# Patient Record
Sex: Male | Born: 1955 | Race: Black or African American | Hispanic: No | Smoking: Current every day smoker
Health system: Southern US, Community
[De-identification: ages and names within clinical notes are randomized; demographics above are authoritative.]

## PROBLEM LIST (undated history)

## (undated) DIAGNOSIS — J45909 Unspecified asthma, uncomplicated: Secondary | ICD-10-CM

## (undated) DIAGNOSIS — I739 Peripheral vascular disease, unspecified: Secondary | ICD-10-CM

## (undated) DIAGNOSIS — I10 Essential (primary) hypertension: Secondary | ICD-10-CM

## (undated) HISTORY — DX: Essential (primary) hypertension: I10

## (undated) HISTORY — DX: Peripheral vascular disease, unspecified: I73.9

## (undated) HISTORY — PX: CYST EXCISION: SHX5701

## (undated) HISTORY — PX: COLONOSCOPY: SHX174

---

## 2000-05-24 ENCOUNTER — Emergency Department (HOSPITAL_COMMUNITY): Admission: EM | Admit: 2000-05-24 | Discharge: 2000-05-24 | Payer: Self-pay | Admitting: Emergency Medicine

## 2000-05-24 ENCOUNTER — Encounter: Payer: Self-pay | Admitting: Emergency Medicine

## 2000-07-20 ENCOUNTER — Emergency Department (HOSPITAL_COMMUNITY): Admission: EM | Admit: 2000-07-20 | Discharge: 2000-07-20 | Payer: Self-pay | Admitting: Emergency Medicine

## 2000-07-27 ENCOUNTER — Emergency Department (HOSPITAL_COMMUNITY): Admission: EM | Admit: 2000-07-27 | Discharge: 2000-07-27 | Payer: Self-pay | Admitting: Emergency Medicine

## 2002-05-03 ENCOUNTER — Emergency Department (HOSPITAL_COMMUNITY): Admission: EM | Admit: 2002-05-03 | Discharge: 2002-05-03 | Payer: Self-pay | Admitting: Emergency Medicine

## 2003-10-21 ENCOUNTER — Emergency Department (HOSPITAL_COMMUNITY): Admission: EM | Admit: 2003-10-21 | Discharge: 2003-10-21 | Payer: Self-pay | Admitting: *Deleted

## 2009-07-21 ENCOUNTER — Emergency Department (HOSPITAL_COMMUNITY): Admission: EM | Admit: 2009-07-21 | Discharge: 2009-07-21 | Payer: Self-pay | Admitting: Emergency Medicine

## 2009-07-22 ENCOUNTER — Emergency Department (HOSPITAL_COMMUNITY): Admission: EM | Admit: 2009-07-22 | Discharge: 2009-07-22 | Payer: Self-pay | Admitting: Emergency Medicine

## 2009-07-25 ENCOUNTER — Emergency Department (HOSPITAL_COMMUNITY): Admission: EM | Admit: 2009-07-25 | Discharge: 2009-07-26 | Payer: Self-pay | Admitting: Emergency Medicine

## 2009-07-27 ENCOUNTER — Emergency Department (HOSPITAL_COMMUNITY): Admission: EM | Admit: 2009-07-27 | Discharge: 2009-07-27 | Payer: Self-pay | Admitting: Emergency Medicine

## 2009-07-28 ENCOUNTER — Emergency Department (HOSPITAL_COMMUNITY): Admission: EM | Admit: 2009-07-28 | Discharge: 2009-07-28 | Payer: Self-pay | Admitting: Emergency Medicine

## 2009-08-23 ENCOUNTER — Emergency Department (HOSPITAL_COMMUNITY): Admission: EM | Admit: 2009-08-23 | Discharge: 2009-08-23 | Payer: Self-pay | Admitting: Emergency Medicine

## 2009-08-25 ENCOUNTER — Emergency Department (HOSPITAL_COMMUNITY): Admission: EM | Admit: 2009-08-25 | Discharge: 2009-08-25 | Payer: Self-pay | Admitting: Emergency Medicine

## 2009-10-04 ENCOUNTER — Encounter: Payer: Self-pay | Admitting: Emergency Medicine

## 2009-10-05 ENCOUNTER — Inpatient Hospital Stay (HOSPITAL_COMMUNITY): Admission: EM | Admit: 2009-10-05 | Discharge: 2009-10-09 | Payer: Self-pay | Admitting: Internal Medicine

## 2009-10-27 ENCOUNTER — Ambulatory Visit: Payer: Self-pay | Admitting: Internal Medicine

## 2009-10-27 DIAGNOSIS — F1020 Alcohol dependence, uncomplicated: Secondary | ICD-10-CM | POA: Insufficient documentation

## 2009-11-20 ENCOUNTER — Inpatient Hospital Stay (HOSPITAL_COMMUNITY): Admission: EM | Admit: 2009-11-20 | Discharge: 2009-11-25 | Payer: Self-pay | Admitting: Emergency Medicine

## 2009-11-20 ENCOUNTER — Ambulatory Visit: Payer: Self-pay | Admitting: Infectious Diseases

## 2009-11-21 ENCOUNTER — Ambulatory Visit: Payer: Self-pay | Admitting: Internal Medicine

## 2009-11-24 ENCOUNTER — Encounter: Payer: Self-pay | Admitting: Internal Medicine

## 2009-11-24 ENCOUNTER — Encounter: Payer: Self-pay | Admitting: Infectious Diseases

## 2009-11-25 ENCOUNTER — Encounter: Payer: Self-pay | Admitting: Internal Medicine

## 2009-12-02 ENCOUNTER — Encounter: Payer: Self-pay | Admitting: Internal Medicine

## 2009-12-02 ENCOUNTER — Ambulatory Visit: Payer: Self-pay | Admitting: Internal Medicine

## 2009-12-02 LAB — CONVERTED CEMR LAB
Calcium: 9.3 mg/dL (ref 8.4–10.5)
Creatinine, Ser: 0.89 mg/dL (ref 0.40–1.50)
Glucose, Bld: 87 mg/dL (ref 70–99)
HCT: 32.7 % — ABNORMAL LOW (ref 39.0–52.0)
Hgb A1c MFr Bld: 5 % (ref 4.6–6.1)
MCHC: 33.4 g/dL (ref 30.0–36.0)
MCV: 96.5 fL (ref >=78.0)
RBC: 3.39 M/uL — ABNORMAL LOW (ref 4.22–5.81)
RDW: 16.3 % — ABNORMAL HIGH (ref 11.5–15.5)

## 2009-12-08 ENCOUNTER — Ambulatory Visit: Payer: Self-pay | Admitting: Internal Medicine

## 2009-12-08 LAB — CONVERTED CEMR LAB
OCCULT 1: NEGATIVE
OCCULT 2: NEGATIVE

## 2010-04-11 ENCOUNTER — Emergency Department (HOSPITAL_COMMUNITY): Admission: EM | Admit: 2010-04-11 | Discharge: 2010-04-11 | Payer: Self-pay | Admitting: Emergency Medicine

## 2010-04-22 ENCOUNTER — Emergency Department (HOSPITAL_COMMUNITY): Admission: EM | Admit: 2010-04-22 | Discharge: 2010-04-22 | Payer: Self-pay | Admitting: Emergency Medicine

## 2010-05-21 ENCOUNTER — Emergency Department (HOSPITAL_COMMUNITY): Admission: EM | Admit: 2010-05-21 | Discharge: 2010-05-21 | Payer: Self-pay | Admitting: Family Medicine

## 2010-06-12 ENCOUNTER — Emergency Department (HOSPITAL_COMMUNITY): Admission: EM | Admit: 2010-06-12 | Discharge: 2010-06-12 | Payer: Self-pay | Admitting: Emergency Medicine

## 2010-06-14 ENCOUNTER — Encounter (INDEPENDENT_AMBULATORY_CARE_PROVIDER_SITE_OTHER): Payer: Self-pay | Admitting: Internal Medicine

## 2010-06-14 ENCOUNTER — Emergency Department (HOSPITAL_COMMUNITY): Admission: EM | Admit: 2010-06-14 | Discharge: 2010-06-15 | Payer: Self-pay | Admitting: Emergency Medicine

## 2010-07-16 ENCOUNTER — Emergency Department (HOSPITAL_COMMUNITY): Admission: EM | Admit: 2010-07-16 | Discharge: 2010-07-16 | Payer: Self-pay | Admitting: Family Medicine

## 2010-07-23 IMAGING — CT CT CERVICAL SPINE W/O CM
5 series · 16 of 33 positions shown, 18 images · non-contrast
Comparison: Maxillofacial and head CTs done earlier the same date.
Cervical spine CT 11/20/2009.

CLINICAL DATA: Status post fall last night.  Mandible fracture.

CT CERVICAL SPINE WITHOUT CONTRAST
TECHNIQUE: Multidetector CT imaging of the cervical spine was
performed. Multiplanar CT image reconstructions were also
generated.

[Series 2: cervical spine · axial · 0.29mm/px · z∈[+39,+99]mm · 2 of 74 slices shown]
[im 25/74  bone]
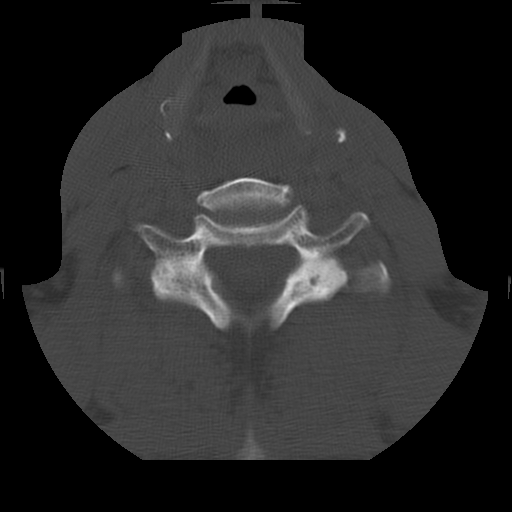
[im 49/74  bone]
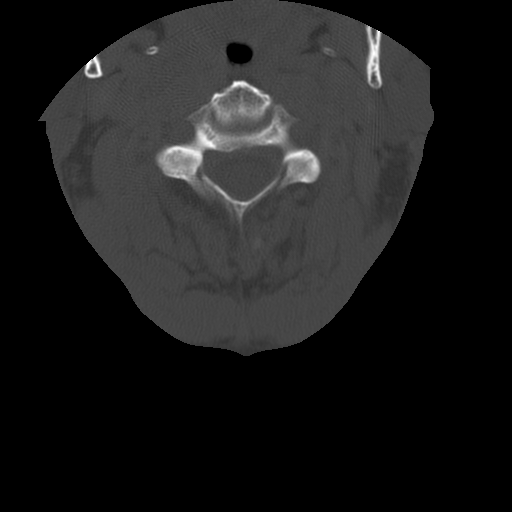

[Series 3: recon 2: cervical spine · axial · 0.29mm/px · z∈[+39,+99]mm · 2 of 74 slices shown]
[im 25/74  bone]
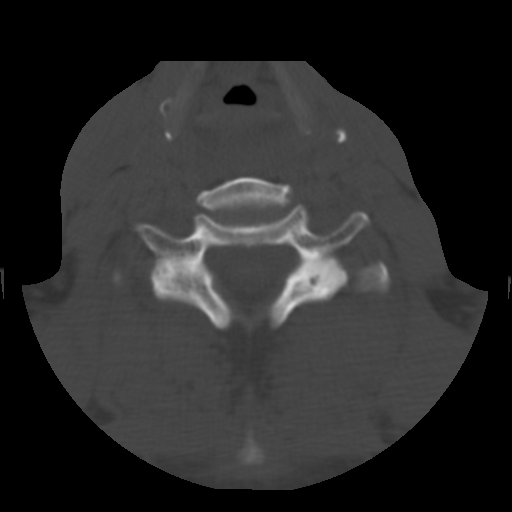
[im 49/74  bone]
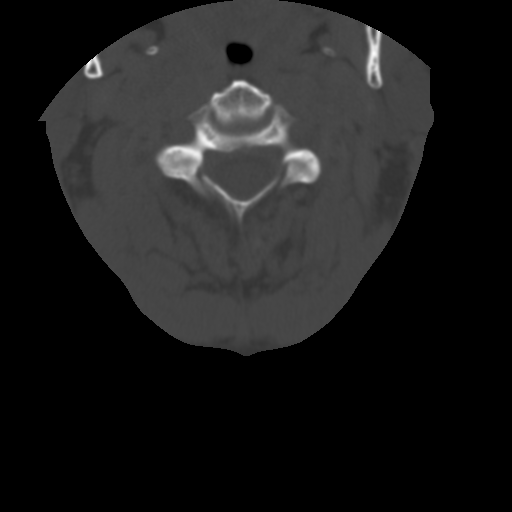

[Series 400: cor · coronal · 0.37mm/px · 3 of 42 slices shown]
[im 9/42  bone]
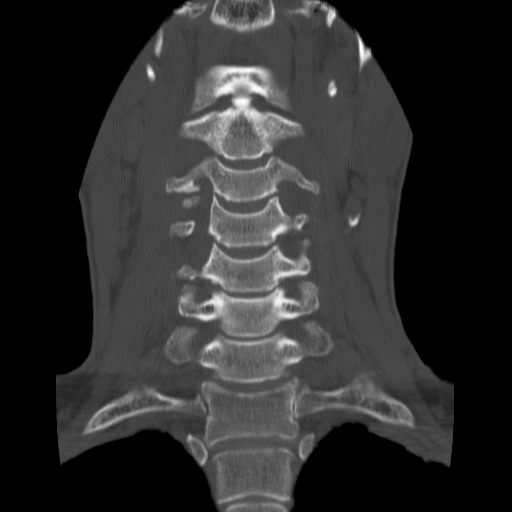
[im 17/42  bone]
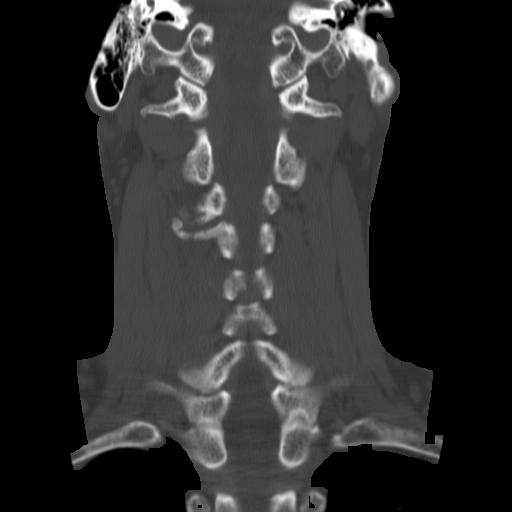
[im 25/42  bone]
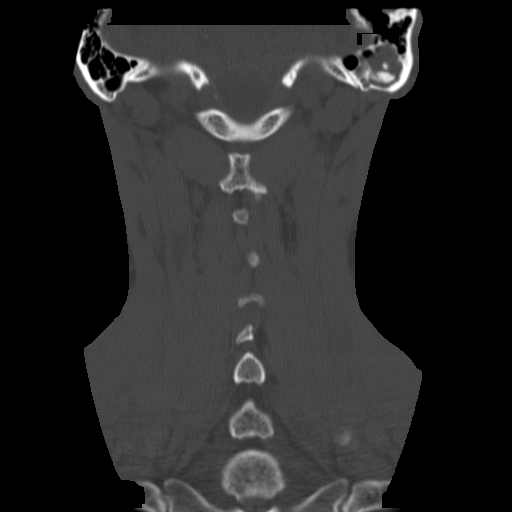

[Series 401: ax · axial · 0.37mm/px · z∈[-30,+67]mm · 4 of 97 slices shown, 5 images]
[im 20/97  soft-tissue]
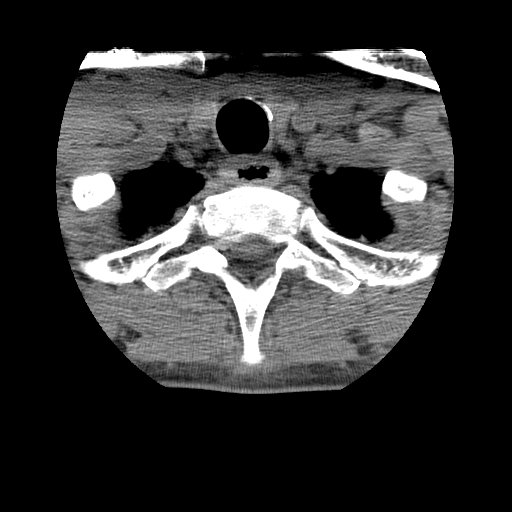
[im 20/97  bone]
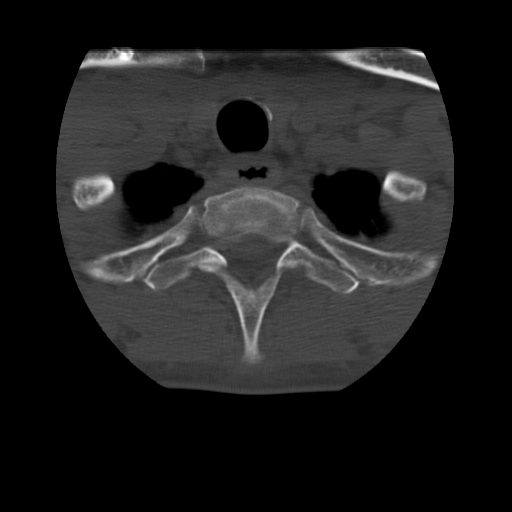
[im 39/97  bone]
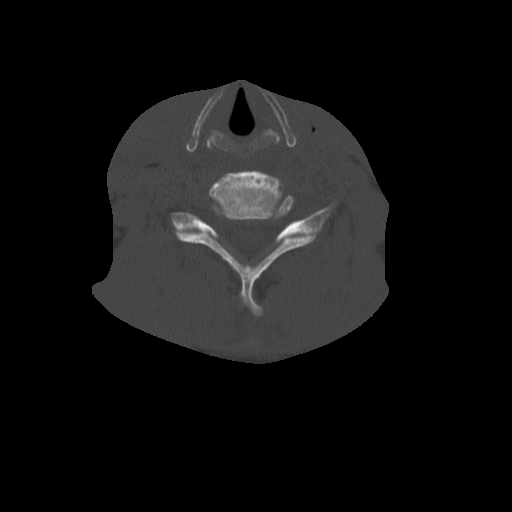
[im 58/97  bone]
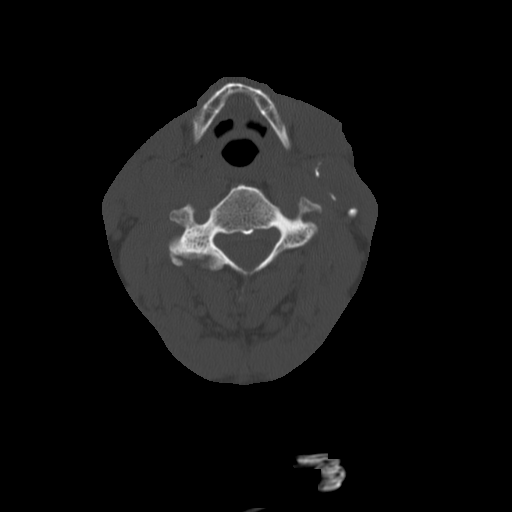
[im 77/97  bone]
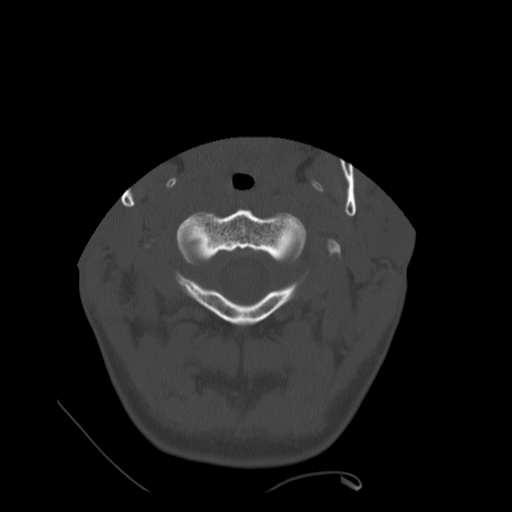

[Series 402: sag · sagittal · 0.37mm/px · 5 of 39 slices shown, 6 images]
[im 13/39  bone]
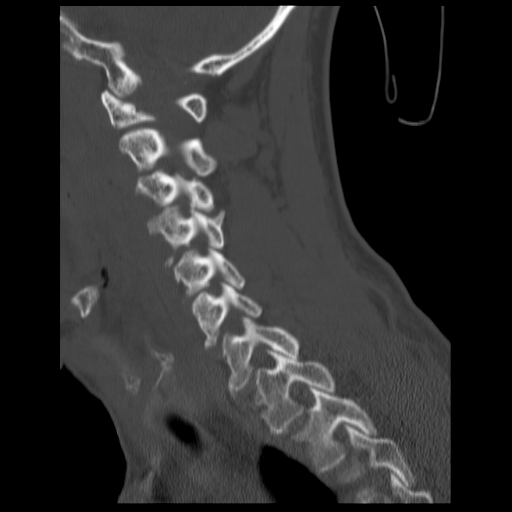
[im 16/39  bone]
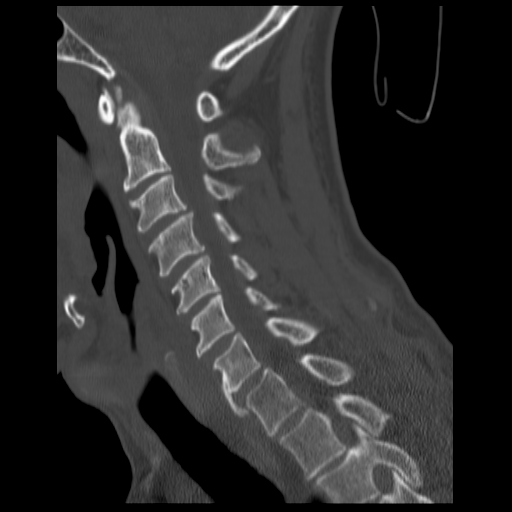
[im 20/39  soft-tissue]
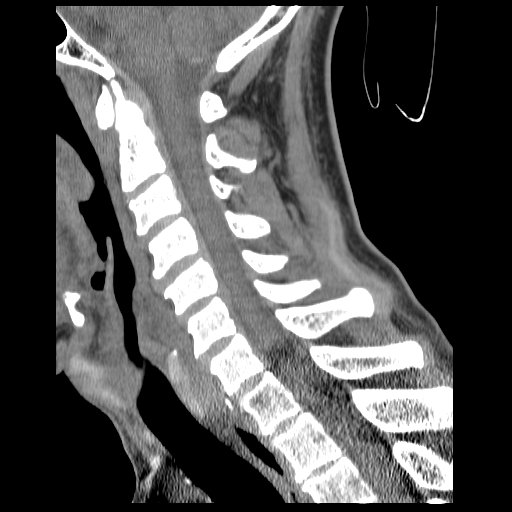
[im 20/39  bone]
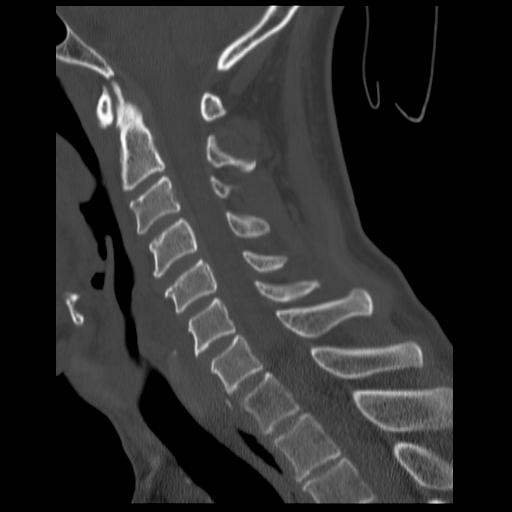
[im 23/39  bone]
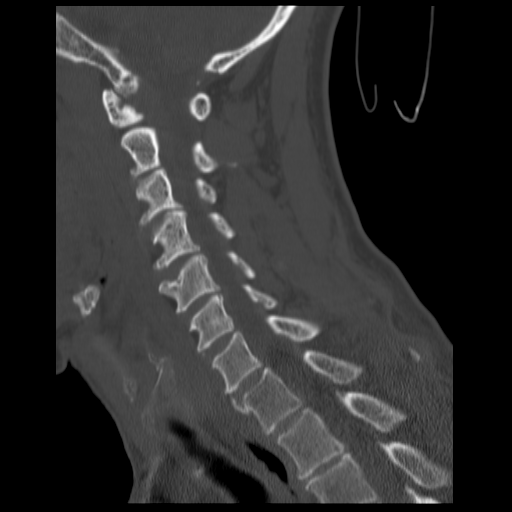
[im 26/39  bone]
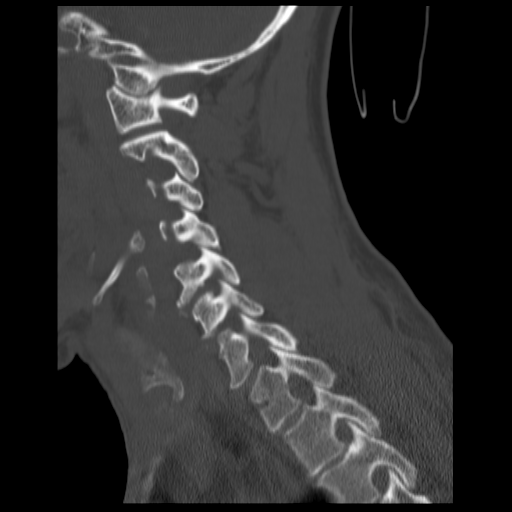

[16 of 33 positions shown; findings below may reference images not displayed]

FINDINGS: The cervical alignment is normal.  There is no evidence
of acute fracture.  There is stable mild spondylosis with advanced
asymmetric right-sided facet hypertrophy at C3-C4.

Previously demonstrated central disc protrusion at C5-C6 is
probably smaller.  There is mild chronic biforaminal stenosis at
that level due to uncinate spurring.

Carotid artery calcifications, biapical pulmonary emphysematous
changes and chronic left mastoid opacification are unchanged.
IMPRESSION: Negative for acute fracture, subluxation or static signs of
instability.  Stable cervical spondylosis.

## 2010-07-26 ENCOUNTER — Encounter (INDEPENDENT_AMBULATORY_CARE_PROVIDER_SITE_OTHER): Payer: Self-pay | Admitting: Internal Medicine

## 2010-08-21 ENCOUNTER — Emergency Department (HOSPITAL_COMMUNITY): Admission: EM | Admit: 2010-08-21 | Discharge: 2010-08-21 | Payer: Self-pay | Admitting: Family Medicine

## 2010-09-21 ENCOUNTER — Ambulatory Visit: Payer: Self-pay | Admitting: Internal Medicine

## 2010-09-21 DIAGNOSIS — R9431 Abnormal electrocardiogram [ECG] [EKG]: Secondary | ICD-10-CM | POA: Insufficient documentation

## 2010-09-21 DIAGNOSIS — I10 Essential (primary) hypertension: Secondary | ICD-10-CM | POA: Insufficient documentation

## 2010-10-11 LAB — CONVERTED CEMR LAB
ALT: 31 units/L (ref 0–53)
AST: 48 units/L — ABNORMAL HIGH (ref 0–37)
Alkaline Phosphatase: 62 units/L (ref 39–117)
Basophils Absolute: 0 10*3/uL (ref 0.0–0.1)
Basophils Relative: 1 % (ref 0–1)
CO2: 23 meq/L (ref 19–32)
Creatinine, Ser: 0.85 mg/dL (ref 0.40–1.50)
Hemoglobin: 12.7 g/dL — ABNORMAL LOW (ref 13.0–17.0)
MCV: 89.9 fL (ref 78.0–100.0)
Potassium: 4.5 meq/L (ref 3.5–5.3)
RBC: 4.06 M/uL — ABNORMAL LOW (ref 4.22–5.81)
Total Bilirubin: 0.6 mg/dL (ref 0.3–1.2)
Total Protein: 8.5 g/dL — ABNORMAL HIGH (ref 6.0–8.3)

## 2010-12-28 ENCOUNTER — Ambulatory Visit: Admit: 2010-12-28 | Payer: Self-pay | Admitting: Internal Medicine

## 2011-01-25 NOTE — Letter (Signed)
Summary: PT INFORMATION SHEET  PT INFORMATION SHEET   Imported By: Arta Bruce 09/21/2010 15:21:42  _____________________________________________________________________  External Attachment:    Type:   Image     Comment:   External Document

## 2011-01-25 NOTE — Assessment & Plan Note (Signed)
Summary: HIGH BP/LR   Vital Signs:  Patient profile:   55 year old male Height:      68.5 inches Weight:      117.3 pounds Temp:     98.4 degrees F oral  Vitals Entered By: Michelle Nasuti (September 21, 2010 12:15 PM) CC: re establish care. htn follow up. med refills needed. pt c/o lower R abd pain at times Pain Assessment Patient in pain? no        CC:  re establish care. htn follow up. med refills needed. pt c/o lower R abd pain at times.  History of Present Illness: 55 yo male here to establish--was seen here once 10/2009 after hospitalization for alcohol withdrawal seizures.    1.  Hypertension:  taking Lisinopril 10 mg daily and Triamterine/HCTZ 37.5/25 mg.  Has not missed.  Tolerating fine.  2.  Abnormal EKG:  noted from ED or hospitalization 05/2010.  Showed poor R wave progression--pt. states hospitalized for hypertension.  He thinks they did perform an echo, but is not clear.  3.  Right mid abdominal pain:  Has had problems for a year.  Later states only every 6 months.  Lasts 30 minutes.  Not a severe pain.  No change with eating.  No change if passes flatus or stool or if urinates.    Current Medications (verified): 1)  Ranitidine Hcl 300 Mg Caps (Ranitidine Hcl) .... Two Times A Day 2)  Lisinopril 10 Mg Tabs (Lisinopril) .... One Daily 3)  Triamterene-Hctz 37.5-25 Mg Tabs (Triamterene-Hctz) .... One Daily  Allergies (verified): No Known Drug Allergies  Family History: Reviewed history from 12/02/2009 and no changes required. No FH of diabetes, heart disease or cancers as far as he can remember  Social History: Reviewed history from 12/02/2009 and no changes required. Lives in [place] with brothers. 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 Past history of cocaine and marijuana, sober since 6 months. Cuurently drinks upto 3 beers/week and smokes 2-3 cig/day, trying to quit  Physical Exam  General:  NAD Lungs:  Normal respiratory effort, chest  expands symmetrically. Lungs are clear to auscultation, no crackles or wheezes. Heart:  Normal rate and regular rhythm. S1 and S2 normal without gallop, murmur, click, rub or other extra sounds.  Radial pulses normal and equal Extremities:  No edema   Impression & Recommendations:  Problem # 1:  ESSENTIAL HYPERTENSION (ICD-401.9)  His updated medication list for this problem includes:    Lisinopril 10 Mg Tabs (Lisinopril) .Marland Kitchen... 1 tab by mouth daily    Triamterene-hctz 37.5-25 Mg Tabs (Triamterene-hctz) .Marland Kitchen... 1 tab by mouth daily  Orders: T-Comprehensive Metabolic Panel ([address]-60454) T-CBC w/Diff (09811-91478) UA Dipstick w/o Micro (manual) (29562)  Problem # 2:  ALCOHOLISM (ICD-303.90) States he is limiting beer currently  Problem # 3:  ELECTROCARDIOGRAM, ABNORMAL (ICD-794.31) Check hospital records to see what evaluation has been done in past  Complete Medication List: 1)  Ranitidine Hcl 300 Mg Caps (Ranitidine hcl) .... Two times a day 2)  Lisinopril 10 Mg Tabs (Lisinopril) .Marland Kitchen.. 1 tab by mouth daily 3)  Triamterene-hctz 37.5-25 Mg Tabs (Triamterene-hctz) .Marland Kitchen.. 1 tab by mouth daily  Patient Instructions: 1)  CPE in 4 months with Dr. Delrae Alfred Prescriptions: TRIAMTERENE-HCTZ 37.5-25 MG TABS (TRIAMTERENE-HCTZ) 1 tab by mouth daily  #30 x 6   Entered and Authorized by:   Julieanne Manson MD   Signed by:   Julieanne Manson MD on 09/21/2010   Method used:   Electronically to  [facility] [address] [phone]* (retail)       [address]       [place], [place]  [address]       Ph: [phone]       Fax: [phone]   RxID:   [id number] LISINOPRIL 10 MG TABS (LISINOPRIL) 1 tab by mouth daily  #30 x 6   Entered and Authorized by:   Julieanne Manson MD   Signed by:   Julieanne Manson MD on 09/21/2010   Method used:   Electronically to        [facility] [phone]* (retail)       [address]       [place], [place]  [address]       Ph: [phone]       Fax:  [phone]   RxID:   [id number]   Appended Document: HIGH BP/LR    Clinical Lists Changes  Observations: Added new observation of PH URINE: 5.5  (09/21/2010 15:43) Added new observation of SPEC GR URIN: <1.005  (09/21/2010 15:43) Added new observation of UA COLOR: yellow  (09/21/2010 15:43) Added new observation of WBC DIPSTK U: negative  (09/21/2010 15:43) Added new observation of NITRITE URN: negative  (09/21/2010 15:43) Added new observation of UROBILINOGEN: 1.0  (09/21/2010 15:43) Added new observation of PROTEIN, URN: negative  (09/21/2010 15:43) Added new observation of BLOOD UR DIP: negative  (09/21/2010 15:43) Added new observation of KETONES URN: negative  (09/21/2010 15:43) Added new observation of BILIRUBIN UR: negative  (09/21/2010 15:43) Added new observation of GLUCOSE, URN: negative  (09/21/2010 15:43)      Laboratory Results   Urine Tests    Routine Urinalysis   Color: yellow Glucose: negative   (Normal Range: Negative) Bilirubin: negative   (Normal Range: Negative) Ketone: negative   (Normal Range: Negative) Spec. Gravity: <1.005   (Normal Range: 1.003-1.035) Blood: negative   (Normal Range: Negative) pH: 5.5   (Normal Range: 5.0-8.0) Protein: negative   (Normal Range: Negative) Urobilinogen: 1.0   (Normal Range: 0-1) Nitrite: negative   (Normal Range: Negative) Leukocyte Esterace: negative   (Normal Range: Negative)

## 2011-02-04 ENCOUNTER — Inpatient Hospital Stay (HOSPITAL_COMMUNITY)
Admission: EM | Admit: 2011-02-04 | Discharge: 2011-02-08 | DRG: 897 | Disposition: A | Discharge status: Home / Self Care | Payer: Self-pay | Attending: Internal Medicine | Admitting: Internal Medicine

## 2011-02-04 DIAGNOSIS — E871 Hypo-osmolality and hyponatremia: Secondary | ICD-10-CM | POA: Diagnosis present

## 2011-02-04 DIAGNOSIS — D696 Thrombocytopenia, unspecified: Secondary | ICD-10-CM | POA: Diagnosis present

## 2011-02-04 DIAGNOSIS — R569 Unspecified convulsions: Secondary | ICD-10-CM | POA: Diagnosis present

## 2011-02-04 DIAGNOSIS — I10 Essential (primary) hypertension: Secondary | ICD-10-CM | POA: Diagnosis present

## 2011-02-04 DIAGNOSIS — F10239 Alcohol dependence with withdrawal, unspecified: Principal | ICD-10-CM | POA: Diagnosis present

## 2011-02-04 DIAGNOSIS — R7402 Elevation of levels of lactic acid dehydrogenase (LDH): Secondary | ICD-10-CM | POA: Diagnosis present

## 2011-02-04 DIAGNOSIS — F17200 Nicotine dependence, unspecified, uncomplicated: Secondary | ICD-10-CM | POA: Diagnosis present

## 2011-02-04 DIAGNOSIS — F10231 Alcohol dependence with withdrawal delirium: Secondary | ICD-10-CM | POA: Insufficient documentation

## 2011-02-04 DIAGNOSIS — F1020 Alcohol dependence, uncomplicated: Secondary | ICD-10-CM | POA: Diagnosis present

## 2011-02-04 DIAGNOSIS — F10939 Alcohol use, unspecified with withdrawal, unspecified: Principal | ICD-10-CM | POA: Diagnosis present

## 2011-02-04 DIAGNOSIS — R7401 Elevation of levels of liver transaminase levels: Secondary | ICD-10-CM | POA: Diagnosis present

## 2011-02-04 DIAGNOSIS — K7010 Alcoholic hepatitis without ascites: Secondary | ICD-10-CM | POA: Diagnosis present

## 2011-02-04 LAB — BASIC METABOLIC PANEL
BUN: 8 mg/dL (ref 6–23)
GFR calc Af Amer: >60 mL/min (ref >60)
Potassium: 3.8 mEq/L (ref 3.5–5.1)

## 2011-02-04 LAB — DIFFERENTIAL
Basophils Absolute: 0 10*3/uL (ref 0.0–0.1)
Basophils Relative: 0 % (ref 0–1)
Lymphocytes Relative: 10 % — ABNORMAL LOW (ref 12–46)
Lymphs Abs: 0.7 10*3/uL (ref 0.7–4.0)
Neutro Abs: 5.9 10*3/uL (ref 1.7–7.7)

## 2011-02-04 LAB — RAPID URINE DRUG SCREEN, HOSP PERFORMED
Amphetamines: NOT DETECTED
Benzodiazepines: NOT DETECTED

## 2011-02-04 LAB — COMPREHENSIVE METABOLIC PANEL
ALT: 56 U/L — ABNORMAL HIGH (ref 0–53)
AST: 89 U/L — ABNORMAL HIGH (ref 0–37)
Calcium: 10 mg/dL (ref 8.4–10.5)
Chloride: 79 mEq/L — ABNORMAL LOW (ref 96–112)
Creatinine, Ser: 0.87 mg/dL (ref 0.4–1.5)
GFR calc non Af Amer: >60 mL/min (ref >60)
Sodium: 118 mEq/L — CL (ref 135–145)
Total Protein: 8.3 g/dL (ref 6.0–8.3)

## 2011-02-04 LAB — MAGNESIUM: Magnesium: 2.8 mg/dL — ABNORMAL HIGH (ref 1.5–2.5)

## 2011-02-04 LAB — ETHANOL: Alcohol, Ethyl (B): <5 mg/dL (ref 0–10)

## 2011-02-04 LAB — CBC
Platelets: 135 10*3/uL — ABNORMAL LOW (ref 150–400)
RBC: 4.07 MIL/uL — ABNORMAL LOW (ref 4.22–5.81)
WBC: 6.9 10*3/uL (ref 4.0–10.5)

## 2011-02-04 LAB — PROTIME-INR: INR: 0.99 (ref 0.00–1.49)

## 2011-02-05 LAB — COMPREHENSIVE METABOLIC PANEL
AST: 62 U/L — ABNORMAL HIGH (ref 0–37)
Calcium: 9.7 mg/dL (ref 8.4–10.5)
Chloride: 92 mEq/L — ABNORMAL LOW (ref 96–112)
Creatinine, Ser: 0.86 mg/dL (ref 0.4–1.5)
GFR calc non Af Amer: >60 mL/min (ref >60)
Glucose, Bld: 105 mg/dL — ABNORMAL HIGH (ref 70–99)
Potassium: 3.9 mEq/L (ref 3.5–5.1)

## 2011-02-05 LAB — CBC
HCT: 35.6 % — ABNORMAL LOW (ref 39.0–52.0)
Hemoglobin: 12.6 g/dL — ABNORMAL LOW (ref 13.0–17.0)
MCH: 31.6 pg (ref 26.0–34.0)
MCHC: 35.4 g/dL (ref 30.0–36.0)
MCV: 89.2 fL (ref 78.0–100.0)
Platelets: 145 10*3/uL — ABNORMAL LOW (ref 150–400)
WBC: 5.1 10*3/uL (ref 4.0–10.5)

## 2011-02-06 LAB — CBC
HCT: 40 % (ref 39.0–52.0)
Hemoglobin: 14.1 g/dL (ref 13.0–17.0)
MCH: 32 pg (ref 26.0–34.0)
Platelets: 151 10*3/uL (ref 150–400)
RDW: 12.6 % (ref 11.5–15.5)

## 2011-02-06 LAB — BASIC METABOLIC PANEL
BUN: 3 mg/dL — ABNORMAL LOW (ref 6–23)
CO2: 25 mEq/L (ref 19–32)
Calcium: 9.9 mg/dL (ref 8.4–10.5)
Chloride: 97 mEq/L (ref 96–112)
GFR calc Af Amer: >60 mL/min (ref >60)
Glucose, Bld: 105 mg/dL — ABNORMAL HIGH (ref 70–99)
Sodium: 131 mEq/L — ABNORMAL LOW (ref 135–145)

## 2011-02-07 LAB — COMPREHENSIVE METABOLIC PANEL
AST: 41 U/L — ABNORMAL HIGH (ref 0–37)
Alkaline Phosphatase: 42 U/L (ref 39–117)
Chloride: 103 mEq/L (ref 96–112)
GFR calc Af Amer: >60 mL/min (ref >60)
GFR calc non Af Amer: >60 mL/min (ref >60)
Sodium: 136 mEq/L (ref 135–145)
Total Bilirubin: 0.5 mg/dL (ref 0.3–1.2)
Total Protein: 6.6 g/dL (ref 6.0–8.3)

## 2011-02-20 NOTE — Discharge Summary (Signed)
Martin Mason                 ACCOUNT NO.:  [id number]  MEDICAL RECORD NO.:  [id number]           PATIENT TYPE:  I  LOCATION:  1512                         FACILITY:  [facility]  PHYSICIAN:  Marcellus Scott, MD     DATE OF BIRTH:  15-Apr-1956  DATE OF ADMISSION:  02/04/2011 DATE OF DISCHARGE:  02/08/2011                              DISCHARGE SUMMARY   PRIMARY CARE PHYSICIAN:  Marcene Duos, M.D.  DISCHARGE DIAGNOSES: 1. Seizure times one, likely secondary to alcohol withdrawal. 2. Alcohol withdrawal delirium tremens, resolved. 3. Hyponatremia, resolved. 4. Thrombocytopenia, resolved. 5. Tobacco abuse. 6. Hypertension. 7. Alcohol dependence. 8. Transaminitis, likely from alcoholic hepatitis.  DISCHARGE MEDICATIONS: 1. Librium 25 mg p.o. on February 09, 2011, then discontinue. 2. Folic acid 1 mg p.o. daily. 3. Multivitamins one capsule p.o. daily. 4. Nicotine 14 mg patch transdermally daily. 5. Thiamine 100 mg p.o. daily.6. Lisinopril 10 mg p.o. daily.  DISCONTINUED MEDICATION: 1. Triamterene/hydrochlorothiazide. 2. Aspirin.  IMAGING AND PROCEDURES:  None.  LABORATORY DATA:  Comprehensive metabolic panel on February 13 only significant for glucose 102 and AST of 41.  Otherwise within normal limits.  CBC on February 12 within normal limits with hemoglobin 14.1, hematocrit 40, white blood cell 5.1, platelets 151 and an MCV of 91. Magnesium was 2.8, INR 0.99.  Admitting platelet count was 135.  Urine drug screen was negative.  Blood alcohol level on admission was less than 5 and on admission the patient had a sodium 118, chloride 79, total bilirubin 1.3, AST 89, ALT 56.  CONSULTATIONS:  None.  DIET:  Heart-healthy diet.  ACTIVITY:  As tolerated.  COMPLAINTS TODAY:  None.  The patient has been coherent for at least the last 36 hours and not demonstrating any features of agitation, tremulousness or anxiety.  He has been pleasant and has been requesting to go  home from yesterday.  The nurses collaborate the same.  He is tolerating diet and has ambulated with OT with no problems.  PHYSICAL EXAMINATION:  GENERAL:  The patient is in no obvious distress. VITAL SIGNS:  Temperature 98.2 degrees Fahrenheit, pulse 65 per minute, respirations 15 per minute, blood pressure 115/65 mmHg, saturating at 100% on room air. RESPIRATORY SYSTEM:  Clear. CARDIOVASCULAR SYSTEM:  First and second heart sounds heard.  Regular rate and rhythm.  No JVD. ABDOMEN:  Nondistended, nontender, soft and bowel sounds present. CENTRAL NERVOUS SYSTEM:  The patient is awake, alert and oriented x3 with no focal neurological deficits. EXTREMITIES:  With grade 5/5 power.  HOSPITAL COURSE:  [name] is a 55 year old African-American male patient with history of alcohol dependence, tobacco abuse and prior alcohol withdrawal seizures who had a seizure at approximately 3:00 a.m. on the day of admission which was witnessed by his girlfriend, who called EMS.  By the time he presented to the emergency room he was coherent and was able to provide a detailed history.  He had no recollection of the event.  He and his girlfriend indicate that they drink up to 4 cans of beer daily, but this dictator is not sure if they do any more  than that.  The patient was admitted for further evaluation and management.  1. Seizure times one.  This was probably alcohol withdrawal seizures.     The patient was admitted to the hospital.  He underwent the alcohol     withdrawal protocol.  He did not demonstrate any further seizures.     He has been repeatedly counseled regarding abstinence from alcohol.     If he has been off alcohol for a few months and then demonstrates     seizures, then he may be a candidate for further evaluation and     treating with an antiepileptic agent.  He indicates that he does     not drive and has been advised not to drive. 2. Alcohol withdrawal delirium tremens.  Within  the first 24 hours the     patient started demonstrating confusion, anxiety, trying to get out     of bed and pulling out IVs.  The Ativan protocol was not fully     effective, whereby he was switched to Librium protocol which worked     better.  He will complete the Librium taper.  He has been advised     repeatedly not to drink alcohol while he is taking the Librium.     Again, he verbalizes understanding and says he wants to quit this     time for good. 3. Hyponatremia on admission, which was multifactorial secondary to     diuretics, beer potomania and nausea and vomiting.  This has     resolved. 4. Thrombocytopenia, likely an alcohol toxic effect.  Resolved. 5. Tobacco abuse.  Cessation counseling done and the patient was     provided with a nicotine patch. 6. Hypertension.  We will continue lisinopril alone and discontinue     his diuretics.  DISPOSITION:  The patient at this time is discharged home in stable condition.  FOLLOWUP RECOMMENDATIONS:  Follow up with Dr. Julieanne Manson in 1 to 2 weeks from discharge.  The patient is to call for an appointment.  We will also ask the social worker to provide him with information regarding alcohol abstinence programs.  Time taken in coordinating this discharge is 35 minutes.     Marcellus Scott, MD     AH/MEDQ  D:  02/08/2011  T:  02/08/2011  Job:  [id number]  cc:   Marcene Duos, M.D.  Electronically Signed by Marcellus Scott MD on 02/20/2011 10:21:02 PM

## 2011-02-20 NOTE — H&P (Signed)
NAME:  Martin Mason, Martin Mason NO.:  [id number]  MEDICAL RECORD NO.:  [id number]           PATIENT TYPE:  E  LOCATION:  WLED                         FACILITY:  [facility]  PHYSICIAN:  Marcellus Scott, MD     DATE OF BIRTH:  10/02/1956  DATE OF ADMISSION:  02/04/2011 DATE OF DISCHARGE:                             HISTORY & PHYSICAL   PRIMARY CARE PHYSICIAN:  Marcene Duos, M.D., at [facility].  CHIEF COMPLAINT:  Seizures and vomiting x1.  HISTORY OF PRESENT ILLNESS:  Martin Mason is a pleasant 55 year old African American male patient with a history of alcohol dependence, tobacco abuse, previous history of alcohol withdrawal seizures who was in his usual state of health until approximately 3 a.m.  The patient's girlfriend is at the bedside and is providing history of too. Approximately 3 a.m., she noticed that the patient began having jerky generalized body movements, suggestive of seizures.  He was lying on his left side.  He was making some sounds from his throat.  He had frothing at his mouth, but no bleeding.  She tried to arouse him, but to no avail.  She called 911.  This seizure activity lasted for approximately 3-4 minutes.  Following which, he continued to be somnolent and not arousable, but continued to read.  Subsequently, he woke up and was drowsy and slightly confused for a few minutes before returning to his usual state.  According to her, currently, he is back to his in "normal" state.  She indicates that he had a similar episode approximately 6 months ago.  He had 1 episode of vomiting sometime yesterday afternoon, which consisted of some clear liquids and food, but no coffee ground or blood.  The patient currently indicates that he has no complaints and denies any headache or fever or chills or pain anywhere.  He last drank some beers last night.  PAST MEDICAL HISTORY: 1. Alcohol dependence. 2. History of alcohol withdrawal seizures who  has not been on     antiepileptic medications. 3. 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 4. 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 5. Hypertension. 6. Childhood asthma, resolved. 7. Dental caries. 8. History of hyponatremia and hypokalemia. 9. History of falls, facial trauma, and multiple fractures including     chronic left maxillary alveolus fracture, old right zygomatic arch     fracture, right subcondylar mandible fracture.  PAST SURGICAL HISTORY:  None.  ALLERGIES:  No known drug allergies.  MEDICATIONS: 1. Aspirin 325 mg, 2 tablets p.o. b.i.d. p.r.n. for pain. 2. Lisinopril 10 mg p.o. daily. 3. Triamterene/hydrochlorothiazide 37.5/25 mg, 1 tablet p.o. daily.  FAMILY HISTORY:  Positive for hypertension.  SOCIAL HISTORY:  The patient lives with his girlfriend and his brother. He is independent of activities of daily living.  He is unemployed.  He smokes half a pack of cigarettes per day and has been doing so for excess of 15 years.  He drinks beers along with his girlfriend.  They indicate that he drinks approximately 440 ounce beers every day of  the week and has been doing so for a long time.  However, it seems as though he may be drinking more when they are indicating.  No history of substance abuse.  REVIEW OF SYSTEMS:  All systems reviewed and apart from history of presenting illness is negative.  No history of abdominal pain or diarrhea.  PHYSICAL EXAMINATION:  GENERAL:  The patient is a moderately built and nourished male patient who is in no obvious distress. VITAL SIGNS:  Temperature 98.4 degrees Fahrenheit, blood pressure 143/96 mmHg, pulse 89 per minute, respirations 20 per minute, and saturating at 100% on room air. HEAD/EYE/ENT:  Nontraumatic, normocephalic.  Pupils equally reacting to light and accommodation.  Bilateral arcus senilis.  Bilateral immature cataract.  Face is slightly asymmetric,  possibly from previous fractures.  He is unable to open his mouth wide secondary to those previous fractures.  He has multiple dental caries.  Oral mucosa looks mildly dry, but no other acute findings. NECK:  Supple.  No JVD or carotid bruit. LYMPHATICS:  No lymphadenopathy. RESPIRATORY:  Distant breath sounds.  Clear to auscultation.  He has no increased work of breathing.  CARDIOVASCULAR:  First and second heart sounds heard.  Regular rate and rhythm.  No murmurs, rubs, gallops, or clicks. ABDOMEN:  Nondistended, nontender, soft.  Bowel sounds present.  No organomegaly or mass appreciated.  CENTRAL NERVOUS:  The patient is awake, alert, oriented x3 with no focal neurological deficit. EXTREMITIES:  Grade 5/5 power. SKIN:  Without any abnormality. PSYCHIATRIC:  The patient has a pleasant and appropriate affect.  He does not appear anxious or tremulous at this time.  LABORATORY DATA:  CBC, hemoglobin 12.9, hematocrit 35, MCV 86, white blood cell 6.9, platelets 135.  Urine drug screen is negative.  Blood alcohol level is less than 5.  Comprehensive metabolic panel significant for sodium 118, chloride 79, glucose 110, total bilirubin 1.3, alkaline phosphatase 48, AST 89, ALT 56.  No imaging studies have been done.  ***EKG shows normal sinus rhythm at 89 beats per minute with first- degree heart block and a PR interval of 218.  Normal axis, some Q waves in leads V1 and V2, some tall T waves in leads V2 through V4, however, his potassium levels are normal and the patient is asymptomatic of any cardiorespiratory symptoms.  ASSESSMENT AND PLAN: 1. Seizure, likely secondary to alcohol withdrawal.  In the past, the     patient has had multiple imaging, i.e., CT of the head, which have     showed no acute findings.  The patient has had an MRI in 2010,     again with no acute intracranial abnormality.  Since his seizures     have been attributed to alcohol withdrawal, he has not been on      antiepileptic medications.  He has been repeatedly counseled in the     past regarding alcohol cessation, but it does not seem to have     worked.  Although the patient is hyponatremic, it is less likely     the cause of seizures in this context, given his awake and alert     mental status at this time.  We will admit the patient to Telemetry     and place him on seizure precautions.  We will provide Ativan IV     p.r.n. during the seizure.  Again, the patient has been repeatedly     counseled regarding abstinence from alcohol use. 2. Hyponatremia, which is possibly multifactorial secondary  to the     diuretics including triamterene and hydrochlorothiazide, an episode     of vomiting and possible beer potomania.  We will monitor him on     Telemetry and hydrate him with IV normal saline and monitor his     basic metabolic panel closely. 3. Alcohol dependence.  We will place him on Ativan withdrawal     protocol and monitor him for alcohol withdrawal delirium tremens. 4. Tobacco abuse.  Cessation counseling done and the patient will be     placed on a nicotine patch. 5. Hypertension.  The patient will be placed only on lisinopril and I     believe he should not be on diuretics, given his propensity to     hyponatremia from his alcoholism. 6. Mild thrombocytopenia.  Likely, a toxic effect of his alcohol     abuse.  We will monitor CBCs daily. 7. Transaminitis, possibly from alcoholic hepatitis.  We will monitor     his LFTs. 8. Advanced directives.  The patient is a full code.  Time taken in coordinating this admission is an hour.     Marcellus Scott, MD     AH/MEDQ  D:  02/04/2011  T:  02/04/2011  Job:  [id number]  cc:   Marcene Duos, M.D.  Electronically Signed by Marcellus Scott MD on 02/20/2011 10:22:46 PM

## 2011-03-13 LAB — URINE MICROSCOPIC-ADD ON

## 2011-03-13 LAB — DIFFERENTIAL
Basophils Absolute: 0 10*3/uL (ref 0.0–0.1)
Eosinophils Absolute: 0.1 10*3/uL (ref 0.0–0.7)
Eosinophils Relative: 1 % (ref 0–5)
Lymphs Abs: 0.6 10*3/uL — ABNORMAL LOW (ref 0.7–4.0)

## 2011-03-13 LAB — RAPID URINE DRUG SCREEN, HOSP PERFORMED
Barbiturates: NOT DETECTED
Benzodiazepines: NOT DETECTED
Cocaine: NOT DETECTED
Opiates: NOT DETECTED

## 2011-03-13 LAB — POCT I-STAT, CHEM 8
Calcium, Ion: 0.97 mmol/L — ABNORMAL LOW (ref 1.12–1.32)
Chloride: 101 mEq/L (ref 96–112)
Creatinine, Ser: 0.7 mg/dL (ref 0.4–1.5)
HCT: 41 % (ref 39.0–52.0)
Hemoglobin: 14.6 g/dL (ref 13.0–17.0)
Sodium: 129 mEq/L — ABNORMAL LOW (ref 135–145)
Sodium: 133 mEq/L — ABNORMAL LOW (ref 135–145)
TCO2: 18 mmol/L (ref 0–100)
TCO2: 24 mmol/L (ref 0–100)

## 2011-03-13 LAB — URINALYSIS, ROUTINE W REFLEX MICROSCOPIC
Bilirubin Urine: NEGATIVE
Glucose, UA: 100 mg/dL — AB
Ketones, ur: 15 mg/dL — AB
pH: 6 (ref 5.0–8.0)

## 2011-03-13 LAB — CBC
HCT: 38.8 % — ABNORMAL LOW (ref 39.0–52.0)
MCHC: 34.3 g/dL (ref 30.0–36.0)
MCV: 95 fL (ref 78.0–100.0)
Platelets: 119 10*3/uL — ABNORMAL LOW (ref 150–400)
RDW: 14.7 % (ref 11.5–15.5)

## 2011-03-13 LAB — GLUCOSE, CAPILLARY

## 2011-03-13 LAB — POCT CARDIAC MARKERS

## 2011-03-15 LAB — POCT I-STAT, CHEM 8
Calcium, Ion: 1.06 mmol/L — ABNORMAL LOW (ref 1.12–1.32)
Glucose, Bld: 94 mg/dL (ref 70–99)
HCT: 42 % (ref 39.0–52.0)
Hemoglobin: 14.3 g/dL (ref 13.0–17.0)
Potassium: 3.5 mEq/L (ref 3.5–5.1)

## 2011-03-22 DIAGNOSIS — N400 Enlarged prostate without lower urinary tract symptoms: Secondary | ICD-10-CM | POA: Insufficient documentation

## 2011-03-22 DIAGNOSIS — Z85038 Personal history of other malignant neoplasm of large intestine: Secondary | ICD-10-CM | POA: Insufficient documentation

## 2011-03-22 DIAGNOSIS — R109 Unspecified abdominal pain: Secondary | ICD-10-CM | POA: Insufficient documentation

## 2011-03-29 LAB — CBC
Platelets: 218 10*3/uL (ref 150–400)
WBC: 4.6 10*3/uL (ref 4.0–10.5)

## 2011-03-29 LAB — RETICULOCYTES
RBC.: 3.46 MIL/uL — ABNORMAL LOW (ref 4.22–5.81)
Retic Count, Absolute: 62.3 10*3/uL (ref 19.0–186.0)

## 2011-03-29 LAB — AMMONIA: Ammonia: 34 umol/L (ref 11–35)

## 2011-03-29 LAB — HEMOCCULT GUIAC POC 1CARD (OFFICE): Fecal Occult Bld: NEGATIVE

## 2011-03-29 LAB — BASIC METABOLIC PANEL
BUN: 12 mg/dL (ref 6–23)
Calcium: 9.6 mg/dL (ref 8.4–10.5)
Creatinine, Ser: 0.92 mg/dL (ref 0.4–1.5)
GFR calc non Af Amer: >60 mL/min (ref >60)
Potassium: 3.4 mEq/L — ABNORMAL LOW (ref 3.5–5.1)

## 2011-03-29 LAB — IRON AND TIBC: Iron: 38 ug/dL — ABNORMAL LOW (ref 42–135)

## 2011-03-29 LAB — VITAMIN B12: Vitamin B-12: 1663 pg/mL — ABNORMAL HIGH (ref 211–911)

## 2011-03-30 LAB — BASIC METABOLIC PANEL
BUN: 10 mg/dL (ref 6–23)
BUN: 17 mg/dL (ref 6–23)
BUN: 4 mg/dL — ABNORMAL LOW (ref 6–23)
BUN: 9 mg/dL (ref 6–23)
CO2: 25 mEq/L (ref 19–32)
CO2: 27 mEq/L (ref 19–32)
Calcium: 9 mg/dL (ref 8.4–10.5)
Chloride: 101 mEq/L (ref 96–112)
Chloride: 103 mEq/L (ref 96–112)
Chloride: 85 mEq/L — ABNORMAL LOW (ref 96–112)
Creatinine, Ser: 0.69 mg/dL (ref 0.4–1.5)
GFR calc Af Amer: >60 mL/min (ref >60)
GFR calc non Af Amer: >60 mL/min (ref >60)
GFR calc non Af Amer: >60 mL/min (ref >60)
GFR calc non Af Amer: >60 mL/min (ref >60)
GFR calc non Af Amer: >60 mL/min (ref >60)
Glucose, Bld: 137 mg/dL — ABNORMAL HIGH (ref 70–99)
Glucose, Bld: 145 mg/dL — ABNORMAL HIGH (ref 70–99)
Glucose, Bld: 97 mg/dL (ref 70–99)
Potassium: 3.6 mEq/L (ref 3.5–5.1)
Potassium: 3.7 mEq/L (ref 3.5–5.1)
Potassium: 3.7 mEq/L (ref 3.5–5.1)
Sodium: 127 mEq/L — ABNORMAL LOW (ref 135–145)
Sodium: 137 mEq/L (ref 135–145)
Sodium: 138 mEq/L (ref 135–145)

## 2011-03-30 LAB — CBC
HCT: 30 % — ABNORMAL LOW (ref 39.0–52.0)
HCT: 30.9 % — ABNORMAL LOW (ref 39.0–52.0)
HCT: 31.2 % — ABNORMAL LOW (ref 39.0–52.0)
HCT: 31.8 % — ABNORMAL LOW (ref 39.0–52.0)
HCT: 31.8 % — ABNORMAL LOW (ref 39.0–52.0)
HCT: 33.5 % — ABNORMAL LOW (ref 39.0–52.0)
Hemoglobin: 10.4 g/dL — ABNORMAL LOW (ref 13.0–17.0)
Hemoglobin: 10.7 g/dL — ABNORMAL LOW (ref 13.0–17.0)
Hemoglobin: 10.7 g/dL — ABNORMAL LOW (ref 13.0–17.0)
Hemoglobin: 10.7 g/dL — ABNORMAL LOW (ref 13.0–17.0)
Hemoglobin: 11.7 g/dL — ABNORMAL LOW (ref 13.0–17.0)
MCHC: 33.7 g/dL (ref 30.0–36.0)
MCHC: 34.4 g/dL (ref 30.0–36.0)
MCHC: 35 g/dL (ref 30.0–36.0)
MCV: 92 fL (ref 78.0–100.0)
MCV: 92.9 fL (ref 78.0–100.0)
MCV: 93.2 fL (ref 78.0–100.0)
MCV: 94.1 fL (ref 78.0–100.0)
MCV: 94.5 fL (ref 78.0–100.0)
MCV: 95.9 fL (ref 78.0–100.0)
Platelets: 181 10*3/uL (ref 150–400)
Platelets: 186 10*3/uL (ref 150–400)
Platelets: 187 10*3/uL (ref 150–400)
Platelets: 200 10*3/uL (ref 150–400)
Platelets: 206 10*3/uL (ref 150–400)
Platelets: 241 10*3/uL (ref 150–400)
RBC: 3.33 MIL/uL — ABNORMAL LOW (ref 4.22–5.81)
RDW: 15.6 % — ABNORMAL HIGH (ref 11.5–15.5)
RDW: 15.8 % — ABNORMAL HIGH (ref 11.5–15.5)
RDW: 15.9 % — ABNORMAL HIGH (ref 11.5–15.5)
RDW: 16 % — ABNORMAL HIGH (ref 11.5–15.5)
RDW: 16 % — ABNORMAL HIGH (ref 11.5–15.5)
RDW: 16 % — ABNORMAL HIGH (ref 11.5–15.5)
RDW: 16 % — ABNORMAL HIGH (ref 11.5–15.5)
WBC: 6.8 10*3/uL (ref 4.0–10.5)
WBC: 7.4 10*3/uL (ref 4.0–10.5)
WBC: 7.4 10*3/uL (ref 4.0–10.5)
WBC: 9.1 10*3/uL (ref 4.0–10.5)

## 2011-03-30 LAB — DIFFERENTIAL
Basophils Absolute: 0 10*3/uL (ref 0.0–0.1)
Basophils Relative: 0 % (ref 0–1)
Eosinophils Absolute: 0 10*3/uL (ref 0.0–0.7)
Eosinophils Relative: 0 % (ref 0–5)
Lymphs Abs: 1.2 10*3/uL (ref 0.7–4.0)
Neutrophils Relative %: 75 % (ref 43–77)

## 2011-03-30 LAB — HEMOCCULT GUIAC POC 1CARD (OFFICE): Fecal Occult Bld: POSITIVE

## 2011-03-30 LAB — GLUCOSE, CAPILLARY: Glucose-Capillary: 130 mg/dL — ABNORMAL HIGH (ref 70–99)

## 2011-03-30 LAB — COMPREHENSIVE METABOLIC PANEL
ALT: 34 U/L (ref 0–53)
AST: 56 U/L — ABNORMAL HIGH (ref 0–37)
Albumin: 4.1 g/dL (ref 3.5–5.2)
Calcium: 9 mg/dL (ref 8.4–10.5)
Creatinine, Ser: 0.7 mg/dL (ref 0.4–1.5)
GFR calc Af Amer: >60 mL/min (ref >60)
Sodium: 135 mEq/L (ref 135–145)
Total Protein: 6.9 g/dL (ref 6.0–8.3)

## 2011-03-30 LAB — URINALYSIS, ROUTINE W REFLEX MICROSCOPIC
Nitrite: NEGATIVE
Protein, ur: 30 mg/dL — AB
Specific Gravity, Urine: 1.018 (ref 1.005–1.030)
Urobilinogen, UA: 1 mg/dL (ref 0.0–1.0)

## 2011-03-30 LAB — RAPID URINE DRUG SCREEN, HOSP PERFORMED
Benzodiazepines: NOT DETECTED
Cocaine: NOT DETECTED
Opiates: NOT DETECTED
Tetrahydrocannabinol: NOT DETECTED

## 2011-03-30 LAB — PROTIME-INR
INR: 1.02 (ref 0.00–1.49)
Prothrombin Time: 13.3 seconds (ref 11.6–15.2)

## 2011-03-30 LAB — HEPATIC FUNCTION PANEL
ALT: 40 U/L (ref 0–53)
AST: 65 U/L — ABNORMAL HIGH (ref 0–37)
Total Protein: 8.3 g/dL (ref 6.0–8.3)

## 2011-03-30 LAB — CROSSMATCH: Antibody Screen: NEGATIVE

## 2011-03-30 LAB — POCT I-STAT 3, ART BLOOD GAS (G3+)
Acid-Base Excess: 4 mmol/L — ABNORMAL HIGH (ref 0.0–2.0)
O2 Saturation: 85 %
TCO2: 29 mmol/L (ref 0–100)
pO2, Arterial: 48 mmHg — ABNORMAL LOW (ref 80.0–100.0)

## 2011-03-30 LAB — URINE MICROSCOPIC-ADD ON

## 2011-03-30 LAB — POCT I-STAT, CHEM 8
BUN: 22 mg/dL (ref 6–23)
Calcium, Ion: 1.03 mmol/L — ABNORMAL LOW (ref 1.12–1.32)
Chloride: 89 mEq/L — ABNORMAL LOW (ref 96–112)
Sodium: 127 mEq/L — ABNORMAL LOW (ref 135–145)

## 2011-03-30 LAB — OSMOLALITY: Osmolality: 275 mOsm/kg (ref 275–300)

## 2011-03-30 LAB — ETHANOL: Alcohol, Ethyl (B): <5 mg/dL (ref 0–10)

## 2011-03-31 LAB — RAPID URINE DRUG SCREEN, HOSP PERFORMED
Amphetamines: NOT DETECTED
Benzodiazepines: NOT DETECTED

## 2011-03-31 LAB — CBC
HCT: 36.7 % — ABNORMAL LOW (ref 39.0–52.0)
HCT: 37.1 % — ABNORMAL LOW (ref 39.0–52.0)
Hemoglobin: 12.5 g/dL — ABNORMAL LOW (ref 13.0–17.0)
MCHC: 33.8 g/dL (ref 30.0–36.0)
MCHC: 34.1 g/dL (ref 30.0–36.0)
MCV: 95 fL (ref 78.0–100.0)
MCV: 95.7 fL (ref 78.0–100.0)
Platelets: 218 10*3/uL (ref 150–400)
RBC: 4.25 MIL/uL (ref 4.22–5.81)
RBC: 4.3 MIL/uL (ref 4.22–5.81)
RDW: 13.8 % (ref 11.5–15.5)
RDW: 13.9 % (ref 11.5–15.5)
RDW: 14.4 % (ref 11.5–15.5)
WBC: 10.9 10*3/uL — ABNORMAL HIGH (ref 4.0–10.5)

## 2011-03-31 LAB — COMPREHENSIVE METABOLIC PANEL
AST: 77 U/L — ABNORMAL HIGH (ref 0–37)
Albumin: 4 g/dL (ref 3.5–5.2)
BUN: 6 mg/dL (ref 6–23)
CO2: 22 mEq/L (ref 19–32)
Calcium: 8.6 mg/dL (ref 8.4–10.5)
Calcium: 9.4 mg/dL (ref 8.4–10.5)
Creatinine, Ser: 0.68 mg/dL (ref 0.4–1.5)
Creatinine, Ser: 0.72 mg/dL (ref 0.4–1.5)
GFR calc Af Amer: >60 mL/min (ref >60)
GFR calc non Af Amer: >60 mL/min (ref >60)
Sodium: 134 mEq/L — ABNORMAL LOW (ref 135–145)
Total Protein: 7.4 g/dL (ref 6.0–8.3)
Total Protein: 7.4 g/dL (ref 6.0–8.3)

## 2011-03-31 LAB — URINALYSIS, ROUTINE W REFLEX MICROSCOPIC
Bilirubin Urine: NEGATIVE
Nitrite: NEGATIVE
Specific Gravity, Urine: 1.011 (ref 1.005–1.030)
Urobilinogen, UA: 0.2 mg/dL (ref 0.0–1.0)

## 2011-03-31 LAB — ETHANOL: Alcohol, Ethyl (B): <5 mg/dL (ref 0–10)

## 2011-03-31 LAB — CULTURE, BLOOD (ROUTINE X 2): Culture: NO GROWTH

## 2011-03-31 LAB — CK: Total CK: 578 U/L — ABNORMAL HIGH (ref 7–232)

## 2011-03-31 LAB — BASIC METABOLIC PANEL
BUN: 10 mg/dL (ref 6–23)
BUN: 4 mg/dL — ABNORMAL LOW (ref 6–23)
BUN: 5 mg/dL — ABNORMAL LOW (ref 6–23)
CO2: 21 mEq/L (ref 19–32)
Calcium: 9.3 mg/dL (ref 8.4–10.5)
Calcium: 9.6 mg/dL (ref 8.4–10.5)
Creatinine, Ser: 0.79 mg/dL (ref 0.4–1.5)
Creatinine, Ser: 0.81 mg/dL (ref 0.4–1.5)
GFR calc non Af Amer: >60 mL/min (ref >60)
GFR calc non Af Amer: >60 mL/min (ref >60)
Glucose, Bld: 102 mg/dL — ABNORMAL HIGH (ref 70–99)
Glucose, Bld: 108 mg/dL — ABNORMAL HIGH (ref 70–99)
Glucose, Bld: 99 mg/dL (ref 70–99)
Potassium: 3.3 mEq/L — ABNORMAL LOW (ref 3.5–5.1)
Sodium: 136 mEq/L (ref 135–145)

## 2011-03-31 LAB — URINE MICROSCOPIC-ADD ON

## 2011-03-31 LAB — MAGNESIUM: Magnesium: 1.9 mg/dL (ref 1.5–2.5)

## 2011-03-31 LAB — APTT: aPTT: 25 seconds (ref 24–37)

## 2011-03-31 LAB — PROTIME-INR
INR: 1 (ref 0.00–1.49)
Prothrombin Time: 13.1 seconds (ref 11.6–15.2)

## 2011-04-02 LAB — RAPID URINE DRUG SCREEN, HOSP PERFORMED
Amphetamines: NOT DETECTED
Cocaine: NOT DETECTED
Opiates: NOT DETECTED
Tetrahydrocannabinol: NOT DETECTED

## 2011-04-03 LAB — URINALYSIS, ROUTINE W REFLEX MICROSCOPIC
Bilirubin Urine: NEGATIVE
Hgb urine dipstick: NEGATIVE
Ketones, ur: 15 mg/dL — AB
Nitrite: NEGATIVE
Urobilinogen, UA: 1 mg/dL (ref 0.0–1.0)

## 2011-04-03 LAB — DIFFERENTIAL
Basophils Absolute: 0 10*3/uL (ref 0.0–0.1)
Basophils Relative: 1 % (ref 0–1)
Eosinophils Absolute: 0.1 10*3/uL (ref 0.0–0.7)
Monocytes Absolute: 0.3 10*3/uL (ref 0.1–1.0)
Neutro Abs: 3.5 10*3/uL (ref 1.7–7.7)

## 2011-04-03 LAB — POCT I-STAT, CHEM 8
BUN: 11 mg/dL (ref 6–23)
Calcium, Ion: 1.11 mmol/L — ABNORMAL LOW (ref 1.12–1.32)
Chloride: 101 mEq/L (ref 96–112)
Creatinine, Ser: 0.6 mg/dL (ref 0.4–1.5)
Glucose, Bld: 94 mg/dL (ref 70–99)
TCO2: 23 mmol/L (ref 0–100)

## 2011-04-03 LAB — CBC
Hemoglobin: 11.7 g/dL — ABNORMAL LOW (ref 13.0–17.0)
MCHC: 33.9 g/dL (ref 30.0–36.0)
MCV: 94.9 fL (ref 78.0–100.0)
RDW: 14.5 % (ref 11.5–15.5)

## 2011-04-03 LAB — POCT CARDIAC MARKERS: Troponin i, poc: <0.05 ng/mL (ref 0.00–0.09)

## 2011-04-03 LAB — APTT: aPTT: 29 seconds (ref 24–37)

## 2011-04-21 ENCOUNTER — Ambulatory Visit (HOSPITAL_COMMUNITY)
Admission: RE | Admit: 2011-04-21 | Discharge: 2011-04-21 | Disposition: A | Discharge status: Home / Self Care | Payer: Self-pay | Source: Ambulatory Visit | Attending: Internal Medicine | Admitting: Internal Medicine

## 2011-04-21 DIAGNOSIS — I10 Essential (primary) hypertension: Secondary | ICD-10-CM | POA: Insufficient documentation

## 2011-04-21 DIAGNOSIS — R9431 Abnormal electrocardiogram [ECG] [EKG]: Secondary | ICD-10-CM | POA: Insufficient documentation

## 2011-06-01 ENCOUNTER — Ambulatory Visit (HOSPITAL_COMMUNITY): Payer: Self-pay

## 2011-07-04 ENCOUNTER — Other Ambulatory Visit: Payer: Self-pay | Admitting: Gastroenterology

## 2012-01-06 ENCOUNTER — Emergency Department (HOSPITAL_COMMUNITY)
Admission: EM | Admit: 2012-01-06 | Discharge: 2012-01-06 | Disposition: A | Discharge status: Home / Self Care | Payer: Self-pay | Attending: Emergency Medicine | Admitting: Emergency Medicine

## 2012-01-06 ENCOUNTER — Other Ambulatory Visit: Payer: Self-pay

## 2012-01-06 ENCOUNTER — Emergency Department (HOSPITAL_COMMUNITY): Payer: Self-pay

## 2012-01-06 DIAGNOSIS — H538 Other visual disturbances: Secondary | ICD-10-CM | POA: Insufficient documentation

## 2012-01-06 DIAGNOSIS — R42 Dizziness and giddiness: Secondary | ICD-10-CM | POA: Insufficient documentation

## 2012-01-06 DIAGNOSIS — I10 Essential (primary) hypertension: Secondary | ICD-10-CM | POA: Insufficient documentation

## 2012-01-06 DIAGNOSIS — Z79899 Other long term (current) drug therapy: Secondary | ICD-10-CM | POA: Insufficient documentation

## 2012-01-06 DIAGNOSIS — R51 Headache: Secondary | ICD-10-CM | POA: Insufficient documentation

## 2012-01-06 LAB — BASIC METABOLIC PANEL
CO2: 28 mEq/L (ref 19–32)
Calcium: 9.6 mg/dL (ref 8.4–10.5)
Glucose, Bld: 87 mg/dL (ref 70–99)
Sodium: 140 mEq/L (ref 135–145)

## 2012-01-06 LAB — CBC
Hemoglobin: 13.7 g/dL (ref 13.0–17.0)
MCH: 29.1 pg (ref 26.0–34.0)
MCV: 84.5 fL (ref 78.0–100.0)
Platelets: 216 10*3/uL (ref 150–400)
RBC: 4.71 MIL/uL (ref 4.22–5.81)

## 2012-01-06 LAB — CARDIAC PANEL(CRET KIN+CKTOT+MB+TROPI)
CK, MB: 3.3 ng/mL (ref 0.3–4.0)
Relative Index: 1.6 (ref 0.0–2.5)
Total CK: 204 U/L (ref 7–232)
Troponin I: <0.3 ng/mL (ref <0.30)

## 2012-01-06 LAB — GLUCOSE, CAPILLARY: Glucose-Capillary: 83 mg/dL (ref 70–99)

## 2012-01-06 MED ORDER — HYDRALAZINE HCL 20 MG/ML IJ SOLN
10.0000 mg | INTRAMUSCULAR | Status: AC
  Administered 2012-01-06: 20 mg via INTRAVENOUS
  Filled 2012-01-06: qty 0.5

## 2012-01-06 NOTE — ED Notes (Signed)
CBG 83 at 15:27

## 2012-01-06 NOTE — ED Notes (Signed)
Patient C/O sudden onset of dizziness and spots before his eyes at 1300 today. Code stroke was encoded at 1412 and called at 1412.  Stroke team arrived at 1415 and Phlebotomist arrived at 31. The patient arrived at 20 and EDP exam was at 1427.  Code Stroke cancelled at 1429 by EDP because symptoms had resolved.

## 2012-01-06 NOTE — ED Provider Notes (Signed)
History     CSN: [id number]  Arrival date & time 01/06/12  1427   First MD Initiated Contact with Patient 01/06/12 1503      Chief Complaint  Patient presents with  . Dizziness   Pt states he became lightheaded at 1300 today with blurred vision and slight frontal HA while watching TV. Symptoms completely resolved 40 after onset. No c/o weakness, numbness, CP, SOB, nausea, vomiting at any point. Pt now completely asymptomatic. Code stroke initially called but after eval, stroke team called off.  (Consider location/radiation/quality/duration/timing/severity/associated sxs/prior treatment) The history is provided by the patient.    No past medical history on file.  No past surgical history on file.  No family history on file.  History  Substance Use Topics  . Smoking status: Not on file  . Smokeless tobacco: Not on file  . Alcohol Use: Not on file      Review of Systems  Constitutional: Negative for fever and chills.  HENT: Negative for congestion, facial swelling and rhinorrhea.   Eyes: Positive for visual disturbance.  Respiratory: Negative for chest tightness, shortness of breath and wheezing.   Cardiovascular: Negative for chest pain and leg swelling.  Gastrointestinal: Negative for nausea, vomiting, abdominal pain and diarrhea.  Genitourinary: Negative for difficulty urinating.  Skin: Negative for rash.  Neurological: Positive for dizziness, light-headedness and headaches. Negative for seizures, syncope, weakness and numbness.    Allergies  Review of patient's allergies indicates no known allergies.  Home Medications   Current Outpatient Rx  Name Route Sig Dispense Refill  . LISINOPRIL 20 MG PO TABS Oral Take 20 mg by mouth daily.      BP 139/75  Pulse 81  Temp(Src) 97.1 F (36.2 C) (Oral)  Resp 14  SpO2 99%  Physical Exam  Nursing note and vitals reviewed. Constitutional: He is oriented to person, place, and time. He appears well-developed and  well-nourished. No distress.  HENT:  Head: Normocephalic and atraumatic.  Mouth/Throat: Oropharynx is clear and moist.  Eyes: EOM are normal. Pupils are equal, round, and reactive to light.  Neck: Normal range of motion. Neck supple.  Cardiovascular: Normal rate and regular rhythm.  Exam reveals no gallop and no friction rub.   No murmur heard. Pulmonary/Chest: Effort normal and breath sounds normal. No respiratory distress. He has no wheezes. He has no rales.  Abdominal: Soft. Bowel sounds are normal. He exhibits no distension and no mass. There is no tenderness. There is no rebound and no guarding.  Musculoskeletal: Normal range of motion. He exhibits no edema and no tenderness.  Neurological: He is alert and oriented to person, place, and time. No cranial nerve deficit.       5/5 motor in all ext, sensation intact  Skin: Skin is warm and dry. No rash noted. No erythema.  Psychiatric: He has a normal mood and affect. His behavior is normal.    ED Course  Procedures (including critical care time)   Labs Reviewed  BASIC METABOLIC PANEL  CBC  PROTIME-INR  APTT  CARDIAC PANEL(CRET KIN+CKTOT+MB+TROPI)  GLUCOSE, CAPILLARY  POCT CBG MONITORING   Ct Head Wo Contrast  01/06/2012  *RADIOLOGY REPORT*  Clinical Data: Blurry vision.  Frontal headache.  History of seizures.  CT HEAD WITHOUT CONTRAST  Technique:  Contiguous axial images were obtained from the base of the skull through the vertex without contrast.  Comparison: 06/14/2010.  Findings: There is no evidence for acute infarction, intracranial hemorrhage, mass lesion, hydrocephalus, or extra-axial fluid.  Mild premature atrophy is present.  Mild chronic microvascular ischemic change is seen in the periventricular and subcortical white matter. And old left thalamic lacunar infarct is present. There is a small lacunar infarct involving the periventricular white matter on the left.  There is a hypodensity involving the genu of the corpus  callosum extending to the right (images 12 and 13 of series 2) which appears to also represent an old infarct, but was not present in 2011.  I do not believe this represents an acute finding.  There is no surrounding edema.  A tiny right thalamic lacunar infarct is redemonstrated.  There is advanced vascular calcification of the internal carotid arteries, and early calcification of the proximal middle cerebral arteries.  There is no acute sinus disease.  Left mastoid fluid accumulation is chronic. There is no visible orbital asymmetry.  IMPRESSION: Focal hypodensity involving the genu of the corpus callosum (forceps minor)  extending to the right appears old, but was not present in 2011.  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  Mild atrophy and chronic microvascular ischemic change with old thalamic lacunar infarcts are stable.  Original Report Authenticated By: Elsie Stain, M.D.     1. Hypertension, poor control     Date: 01/06/2012  Rate: 62  Rhythm: normal sinus rhythm  QRS Axis: normal  Intervals: normal  ST/T Wave abnormalities: normal  Conduction Disutrbances:none  Narrative Interpretation:   Old EKG Reviewed: no significant changes    MDM   Pt now feeling better as BP improves.. Symptoms likely related to uncontrolled hypertension. Will r/o intracranial pathology. Will likely need to f/u with PMD for tighter BP control  Pt states he is asymptomatic. BP WNL. CT with old findings. F/u with PMD for med adjustment. Return immediately for worsening symptoms or concerns.       Loren Racer, MD 01/06/12 1755

## 2012-01-06 NOTE — ED Notes (Signed)
Per [facility], pt from home, pt watching TV c/o sudden onset of dizziness, lightheaded, and seeing spots, s/s started approx 1300, s/s subsided w/in approx 40 mins after onset, nad, no complaints. Pt HTN,  Hx of HTN, takes prescribed dose of Lisinopril

## 2012-01-19 ENCOUNTER — Encounter (HOSPITAL_COMMUNITY): Payer: Self-pay | Admitting: *Deleted

## 2012-01-19 ENCOUNTER — Emergency Department (HOSPITAL_COMMUNITY)
Admission: EM | Admit: 2012-01-19 | Discharge: 2012-01-19 | Disposition: A | Discharge status: Home / Self Care | Payer: Self-pay | Attending: Emergency Medicine | Admitting: Emergency Medicine

## 2012-01-19 DIAGNOSIS — R040 Epistaxis: Secondary | ICD-10-CM | POA: Insufficient documentation

## 2012-01-19 NOTE — ED Provider Notes (Signed)
Medical screening examination/treatment/procedure(s) were performed by non-physician practitioner and as supervising physician I was immediately available for consultation/collaboration.  Cyndra Numbers, MD 01/19/12 2241

## 2012-01-19 NOTE — ED Provider Notes (Signed)
History     CSN: [id number]  Arrival date & time 01/19/12  1248   First MD Initiated Contact with Patient 01/19/12 1435      Chief Complaint  Patient presents with  . Epistaxis    (Consider location/radiation/quality/duration/timing/severity/associated sxs/prior treatment) HPI Comments: Versed promote a chief complaint of epistaxis.  Patient states that it began around 11 this morning.  He states this is happening about once a day for a week now.  Patient currently is not bleeding and is not taking any anticoagulant medications.  Patient did not feel lightheaded, dizzy, presyncopal.  The patient has no other complaints besides epistaxis.  Patient denies fevers, night sweats, chills.  Patient states he's been picking and blowing his nose more than usual and questions if this is related to his nosebleeds.  Patient is a current smoker. Pt denies trauma.   Patient is a 56 y.o. male presenting with nosebleeds.  Epistaxis     History reviewed. No pertinent past medical history.  History reviewed. No pertinent past surgical history.  No family history on file.  History  Substance Use Topics  . Smoking status: Current Everyday Smoker -- 0.5 packs/day    Types: Cigarettes  . Smokeless tobacco: Not on file  . Alcohol Use: No      Review of Systems  Constitutional: Negative for fever, chills and appetite change.  HENT: Positive for nosebleeds. Negative for congestion.   Eyes: Negative for visual disturbance.  Respiratory: Negative for shortness of breath.   Cardiovascular: Negative for chest pain and leg swelling.  Gastrointestinal: Negative for abdominal pain.  Genitourinary: Negative for dysuria, urgency and frequency.  Neurological: Negative for dizziness, syncope, weakness, light-headedness, numbness and headaches.  Psychiatric/Behavioral: Negative for confusion.  All other systems reviewed and are negative.    Allergies  Review of patient's allergies indicates no known  allergies.  Home Medications   Current Outpatient Rx  Name Route Sig Dispense Refill  . LISINOPRIL 20 MG PO TABS Oral Take 20 mg by mouth daily.      BP 132/73  Pulse 72  Temp(Src) 98.2 F (36.8 C) (Oral)  Resp 20  SpO2 100%  Physical Exam  Nursing note and vitals reviewed. Constitutional: He is oriented to person, place, and time. He appears well-developed and well-nourished. No distress.  HENT:  Head: Normocephalic and atraumatic. Head is without raccoon's eyes, without Battle's sign and without contusion.        Visible small anterior blood clot seen in left nare.   Eyes: Conjunctivae and EOM are normal.  Neck: Normal range of motion.  Pulmonary/Chest: Effort normal.  Musculoskeletal: Normal range of motion.  Neurological: He is alert and oriented to person, place, and time.  Skin: Skin is warm and dry. No rash noted. He is not diaphoretic.  Psychiatric: He has a normal mood and affect. His behavior is normal.    ED Course  Procedures (including critical care time)  Labs Reviewed - No data to display No results found.   No diagnosis found.    MDM  Epistaxis- resolved  Pt does not have current nose bleed. Discussed conservative home therapies.       Jaci Carrel, [place] 01/19/12 1516

## 2012-01-19 NOTE — ED Notes (Signed)
Pt reports spontaneous nosebleed from his right nare which lasted less than a minute (this occurred around 11am).  No bleeding noted at this time.  Pt is alert and oriented and is in no distress, reports that he is "feeling good"

## 2012-01-19 NOTE — ED Notes (Signed)
Pt reports spontaneous epitaxis that last 3-4 min.  These nose bleeds occur only from left nare. Pt has used cocaine 2 yrs ago and has had to have this nare packed in the past.  No cocaine use in over 2 yrs.  Pt concerned that BP is high  And causing this

## 2013-01-15 ENCOUNTER — Encounter (HOSPITAL_COMMUNITY): Payer: Self-pay

## 2013-01-15 ENCOUNTER — Emergency Department (INDEPENDENT_AMBULATORY_CARE_PROVIDER_SITE_OTHER)
Admission: EM | Admit: 2013-01-15 | Discharge: 2013-01-15 | Disposition: A | Discharge status: Home / Self Care | Payer: Self-pay | Source: Home / Self Care

## 2013-01-15 DIAGNOSIS — N400 Enlarged prostate without lower urinary tract symptoms: Secondary | ICD-10-CM

## 2013-01-15 DIAGNOSIS — I10 Essential (primary) hypertension: Secondary | ICD-10-CM

## 2013-01-15 DIAGNOSIS — R9431 Abnormal electrocardiogram [ECG] [EKG]: Secondary | ICD-10-CM

## 2013-01-15 LAB — LIPID PANEL
Cholesterol: 173 mg/dL (ref 0–200)
HDL: 65 mg/dL (ref >39)
LDL Cholesterol: 97 mg/dL (ref 0–99)
Triglycerides: 53 mg/dL (ref <150)
VLDL: 11 mg/dL (ref 0–40)

## 2013-01-15 LAB — CBC
HCT: 39.2 % (ref 39.0–52.0)
Hemoglobin: 13.8 g/dL (ref 13.0–17.0)
WBC: 8.5 10*3/uL (ref 4.0–10.5)

## 2013-01-15 LAB — COMPREHENSIVE METABOLIC PANEL
BUN: 12 mg/dL (ref 6–23)
Calcium: 10.6 mg/dL — ABNORMAL HIGH (ref 8.4–10.5)
Creatinine, Ser: 1.12 mg/dL (ref 0.50–1.35)
GFR calc Af Amer: 82 mL/min — ABNORMAL LOW (ref >90)
Glucose, Bld: 103 mg/dL — ABNORMAL HIGH (ref 70–99)
Total Protein: 8.8 g/dL — ABNORMAL HIGH (ref 6.0–8.3)

## 2013-01-15 MED ORDER — LISINOPRIL-HYDROCHLOROTHIAZIDE 20-25 MG PO TABS: 1.0000 | ORAL_TABLET | Freq: Every day | ORAL | Status: DC

## 2013-01-15 NOTE — ED Notes (Signed)
[facility] client with history of hypertension-needs medication refill

## 2013-01-15 NOTE — ED Provider Notes (Signed)
History     CSN: [id number]  Arrival date & time 01/15/13  1702  Chief Complaint  Patient presents with  . Medication Refill   HPI Pt says that he has been putting salt on his food and says it may be the reason that his blood pressure is elevated.  He apparently has been using quite a bit of salt on his food.    History reviewed. No pertinent past medical history.  History reviewed. No pertinent past surgical history.  No family history on file.  History  Substance Use Topics  . Smoking status: Current Every Day Smoker -- 0.5 packs/day    Types: Cigarettes  . Smokeless tobacco: Not on file  . Alcohol Use: No    Review of Systems  Constitutional: Negative.   HENT: Negative.   Respiratory: Negative.   Cardiovascular: Negative.   Gastrointestinal: Negative.   Genitourinary: Negative.   Musculoskeletal: Negative.   Neurological: Negative.   Hematological: Negative.   Psychiatric/Behavioral: Negative.   All other systems reviewed and are negative.    Allergies  Review of patient's allergies indicates no known allergies.  Home Medications   Current Outpatient Rx  Name  Route  Sig  Dispense  Refill  . LISINOPRIL 20 MG PO TABS   Oral   Take 20 mg by mouth daily.           BP 162/90  Pulse 62  Temp 97.8 F (36.6 C)  Resp 17  SpO2 100%  Physical Exam  Nursing note and vitals reviewed. Constitutional: He is oriented to person, place, and time. He appears well-developed and well-nourished. No distress.  HENT:  Head: Normocephalic and atraumatic.  Eyes: EOM are normal. Pupils are equal, round, and reactive to light.  Neck: Normal range of motion. Neck supple.  Cardiovascular: Normal rate, regular rhythm and normal heart sounds.   Pulmonary/Chest: Effort normal and breath sounds normal.  Abdominal: Soft. Bowel sounds are normal.  Musculoskeletal: Normal range of motion. He exhibits no edema and no tenderness.  Neurological: He is alert and oriented to  person, place, and time. He has normal reflexes.  Skin: Skin is warm and dry.  Psychiatric: He has a normal mood and affect. His behavior is normal. Judgment and thought content normal.    ED Course  Procedures (including critical care time)  Labs Reviewed - No data to display No results found.  No diagnosis found.  MDM  IMPRESSION  Hypertension  RECOMMENDATIONS / PLAN I think the patient's blood pressure has been suboptimally controlled because of his salt intake.  We had a long discussion about this today.  He can't make every effort to try and reduce sodium content in all of his foods.  Pt has been taking zestoretic 20/25 take 1 po daily.  BP recheck with nurse in 2 weeks Checking labs today.   FOLLOW UP 3 months  The patient was given clear instructions to go to ER or return to medical center if symptoms don't improve, worsen or new problems develop.  The patient verbalized understanding.  The patient was told to call to get lab results if they haven't heard anything in the next week.            Cleora Fleet, MD 01/15/13 Ernestina [place]

## 2013-01-16 NOTE — Progress Notes (Signed)
Quick Note:  Please call patient notified him that his labs came back pretty good. I like to have them rechecked again in 3-4 months.   Rodney Langton, MD, CDE, FAAFP [facility] [place], [place]   ______

## 2013-01-17 ENCOUNTER — Telehealth (HOSPITAL_COMMUNITY): Payer: Self-pay

## 2013-01-17 NOTE — Telephone Encounter (Signed)
Message copied by Lestine Mount on Thu Jan 17, 2013  6:31 PM ------      Message from: Cleora Fleet      Created: Wed Jan 16, 2013  8:00 PM       Please call patient notified him that his labs came back pretty good.  I like to have them rechecked again in 3-4 months.                  Rodney Langton, MD, CDE, FAAFP      [facility]      [facility]      [place], [place]

## 2013-05-16 ENCOUNTER — Ambulatory Visit: Payer: Self-pay | Attending: Family Medicine | Admitting: Family Medicine

## 2013-05-16 VITALS — BP 145/81 | HR 66 | Temp 98.7°F | Resp 14 | Ht 67.5 in | Wt 122.0 lb

## 2013-05-16 DIAGNOSIS — I10 Essential (primary) hypertension: Secondary | ICD-10-CM | POA: Insufficient documentation

## 2013-05-16 MED ORDER — LISINOPRIL-HYDROCHLOROTHIAZIDE 20-25 MG PO TABS: 1.0000 | ORAL_TABLET | Freq: Every day | ORAL | Status: DC

## 2013-05-16 NOTE — Patient Instructions (Addendum)

## 2013-05-16 NOTE — Progress Notes (Signed)
Patient ID: Martin Mason, male   DOB: [birth year], 57 y.o.   MRN: [id number]  CC: follow up   HPI: Pt says that he feels great. No complaints. He is tolerating his meds well.    No Known Allergies Past Medical History  Diagnosis Date  . Hypertension    Current Outpatient Prescriptions on File Prior to Visit  Medication Sig Dispense Refill  . lisinopril-hydrochlorothiazide (PRINZIDE,ZESTORETIC) 20-25 MG per tablet Take 1 tablet by mouth daily.  30 tablet  3  . [DISCONTINUED] lisinopril (PRINIVIL,ZESTRIL) 20 MG tablet Take 20 mg by mouth daily.       No current facility-administered medications on file prior to visit.   Family History  Problem Relation Age of Onset  . Hypertension Mother   . Cancer Mother   . Hypertension Father    History   Social History  . Marital Status: Single    Spouse Name: N/A    Number of Children: N/A  . Years of Education: N/A   Occupational History  . Not on file.   Social History Main Topics  . Smoking status: Current Every Day Smoker -- 0.50 packs/day    Types: Cigarettes  . Smokeless tobacco: Not on file  . Alcohol Use: No  . Drug Use: No  . Sexually Active: Not on file   Other Topics Concern  . Not on file   Social History Narrative  . No narrative on file    Review of Systems  Constitutional: Negative for fever, chills, diaphoresis, activity change, appetite change and fatigue.  HENT: Negative for ear pain, nosebleeds, congestion, facial swelling, rhinorrhea, neck pain, neck stiffness and ear discharge.   Eyes: Negative for pain, discharge, redness, itching and visual disturbance.  Respiratory: Negative for cough, choking, chest tightness, shortness of breath, wheezing and stridor.   Cardiovascular: Negative for chest pain, palpitations and leg swelling.  Gastrointestinal: Negative for abdominal distention.  Genitourinary: Negative for dysuria, urgency, frequency, hematuria, flank pain, decreased urine volume, difficulty urinating  and dyspareunia.  Musculoskeletal: Negative for back pain, joint swelling, arthralgias and gait problem.  Neurological: Negative for dizziness, tremors, seizures, syncope, facial asymmetry, speech difficulty, weakness, light-headedness, numbness and headaches.  Hematological: Negative for adenopathy. Does not bruise/bleed easily.  Psychiatric/Behavioral: Negative for hallucinations, behavioral problems, confusion, dysphoric mood, decreased concentration and agitation.    Objective:   Filed Vitals:   05/16/13 1628  BP: 145/81  Pulse: 66  Temp: 98.7 F (37.1 C)  Resp: 14    Physical Exam  Constitutional: Appears well-developed and well-nourished. No distress.  HENT: Normocephalic. External right and left ear normal. Oropharynx is clear and moist.  Eyes: Conjunctivae and EOM are normal. PERRLA, no scleral icterus.  Neck: Normal ROM. Neck supple. No JVD. No tracheal deviation. No thyromegaly.  CVS: RRR, S1/S2 +, no murmurs, no gallops, no carotid bruit.  Pulmonary: Effort and breath sounds normal, no stridor, rhonchi, wheezes, rales.  Abdominal: Soft. BS +,  no distension, tenderness, rebound or guarding.  Musculoskeletal: Normal range of motion. No edema and no tenderness.  Lymphadenopathy: No lymphadenopathy noted, cervical, inguinal. Neuro: Alert. Normal reflexes, muscle tone coordination. No cranial nerve deficit. Skin: Skin is warm and dry. No rash noted. Not diaphoretic. No erythema. No pallor.  Psychiatric: Normal mood and affect. Behavior, judgment, thought content normal.   Lab Results  Component Value Date   WBC 8.5 01/15/2013   HGB 13.8 01/15/2013   HCT 39.2 01/15/2013   MCV 84.3 01/15/2013   PLT 279 01/15/2013  Lab Results  Component Value Date   CREATININE 1.12 01/15/2013   BUN 12 01/15/2013   NA 140 01/15/2013   K 4.1 01/15/2013   CL 98 01/15/2013   CO2 29 01/15/2013    Lab Results  Component Value Date   HGBA1C 5.0 12/02/2009   Lipid Panel     Component Value  Date/Time   CHOL 173 01/15/2013 1901   TRIG 53 01/15/2013 1901   HDL 65 01/15/2013 1901   CHOLHDL 2.7 01/15/2013 1901   VLDL 11 01/15/2013 1901   LDLCALC 97 01/15/2013 1901       Assessment and plan:   Patient Active Problem List   Diagnosis Date Noted  . 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 FLANK PAIN, RIGHT 03/22/2011  . NEOPLASM, MALIGNANT, COLON, HX OF 03/22/2011  . 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 ESSENTIAL HYPERTENSION 09/21/2010  . ELECTROCARDIOGRAM, ABNORMAL 09/21/2010  . ALCOHOLISM 10/27/2009    HTN better controlled Refilled medications today Check labs today  The patient was given clear instructions to go to ER or return to medical center if symptoms don't improve, worsen or new problems develop.  The patient verbalized understanding.  The patient was told to call to get lab results if they haven't heard anything in the next week.    Follow up in 4 months  Rodney Langton, MD, CDE, FAAFP [facility] [place], [place]

## 2013-05-16 NOTE — Progress Notes (Signed)
Patient states that his last treatment for hypertension was from [facility].

## 2013-05-17 ENCOUNTER — Telehealth: Payer: Self-pay | Admitting: *Deleted

## 2013-05-17 LAB — BASIC METABOLIC PANEL
BUN: 10 mg/dL (ref 6–23)
CO2: 28 mEq/L (ref 19–32)
Calcium: 9.8 mg/dL (ref 8.4–10.5)
Creat: 1.19 mg/dL (ref 0.50–1.35)

## 2013-05-17 LAB — LIPID PANEL: HDL: 51 mg/dL (ref >39)

## 2013-05-17 NOTE — Progress Notes (Signed)
Quick Note:  Please inform patient that labs came back OK. Recheck in 4 months.   Rodney Langton, MD, CDE, FAAFP [facility] [place], [place]   ______

## 2013-05-17 NOTE — Telephone Encounter (Signed)
05/17/13 Patient made aware that labs are okay per Dr. Laural Benes and schedule appt in 4 months. P.Doryan Bahl,RN

## 2013-09-16 ENCOUNTER — Ambulatory Visit: Payer: Self-pay | Admitting: Internal Medicine

## 2013-11-13 ENCOUNTER — Ambulatory Visit: Payer: Self-pay

## 2013-11-14 ENCOUNTER — Telehealth: Payer: Self-pay | Admitting: Emergency Medicine

## 2013-11-14 ENCOUNTER — Other Ambulatory Visit: Payer: Self-pay | Admitting: Emergency Medicine

## 2013-11-14 MED ORDER — LISINOPRIL-HYDROCHLOROTHIAZIDE 20-25 MG PO TABS: 1.0000 | ORAL_TABLET | Freq: Every day | ORAL | Status: DC

## 2013-11-14 NOTE — Telephone Encounter (Signed)
Pt called requesting medication refill Bp med until next visit 11/28/13. Missed scheduled appt yesterday and only has 3 pills left

## 2013-11-28 ENCOUNTER — Ambulatory Visit: Payer: Self-pay | Attending: Internal Medicine | Admitting: Internal Medicine

## 2013-11-28 ENCOUNTER — Encounter: Payer: Self-pay | Admitting: Internal Medicine

## 2013-11-28 VITALS — BP 160/103 | HR 65 | Temp 98.0°F | Resp 16 | Ht 67.0 in | Wt 122.0 lb

## 2013-11-28 DIAGNOSIS — I10 Essential (primary) hypertension: Secondary | ICD-10-CM | POA: Insufficient documentation

## 2013-11-28 DIAGNOSIS — Z23 Encounter for immunization: Secondary | ICD-10-CM

## 2013-11-28 LAB — CMP AND LIVER
ALT: 10 U/L (ref 0–53)
Albumin: 4.9 g/dL (ref 3.5–5.2)
Bilirubin, Direct: 0.1 mg/dL (ref 0.0–0.3)
CO2: 29 mEq/L (ref 19–32)
Calcium: 10.3 mg/dL (ref 8.4–10.5)
Chloride: 98 mEq/L (ref 96–112)
Glucose, Bld: 95 mg/dL (ref 70–99)
Indirect Bilirubin: 0.2 mg/dL (ref 0.0–0.9)
Sodium: 136 mEq/L (ref 135–145)
Total Bilirubin: 0.3 mg/dL (ref 0.3–1.2)
Total Protein: 7.9 g/dL (ref 6.0–8.3)

## 2013-11-28 LAB — CBC WITH DIFFERENTIAL/PLATELET
Basophils Absolute: 0.1 10*3/uL (ref 0.0–0.1)
Basophils Relative: 1 % (ref 0–1)
Eosinophils Absolute: 0.1 10*3/uL (ref 0.0–0.7)
Hemoglobin: 14.1 g/dL (ref 13.0–17.0)
MCH: 28.2 pg (ref 26.0–34.0)
MCHC: 34.6 g/dL (ref 30.0–36.0)
Neutro Abs: 3.9 10*3/uL (ref 1.7–7.7)
Neutrophils Relative %: 59 % (ref 43–77)
Platelets: 295 10*3/uL (ref 150–400)
RDW: 14.3 % (ref 11.5–15.5)

## 2013-11-28 LAB — LIPID PANEL
Cholesterol: 176 mg/dL (ref 0–200)
Triglycerides: 62 mg/dL (ref <150)

## 2013-11-28 MED ORDER — LISINOPRIL-HYDROCHLOROTHIAZIDE 20-25 MG PO TABS: 1.0000 | ORAL_TABLET | Freq: Every day | ORAL | Status: DC

## 2013-11-28 NOTE — Patient Instructions (Signed)

## 2013-11-28 NOTE — Progress Notes (Signed)
Pt is here following up on his HTN. 

## 2013-11-29 NOTE — Progress Notes (Signed)
Patient ID: Martin Mason, male   DOB: 30-Oct-1956, 57 y.o.   MRN: [id number] Patient Demographics  Martin Mason, is a 57 y.o. male  JYN:[id number]  [id number]  DOB - 1956-05-11  Chief Complaint  Patient presents with  . Follow-up        Subjective:   Martin Mason is a 57 y.o. male here today for a follow up visit. Patient has no complaint, need refill of his medications. He is doing very well except that he continue to smoke despite repeated counseling. He came his blood pressure is normal at home I was surprised to find out high blood pressure today. Patient has No headache, No chest pain, No abdominal pain - No Nausea, No new weakness tingling or numbness, No Cough - SOB.  ALLERGIES: No Known Allergies  PAST MEDICAL HISTORY: Past Medical History  Diagnosis Date  . Hypertension     MEDICATIONS AT HOME: Prior to Admission medications   Medication Sig Start Date End Date Taking? Authorizing Provider  lisinopril-hydrochlorothiazide (PRINZIDE,ZESTORETIC) 20-25 MG per tablet Take 1 tablet by mouth daily. 11/28/13  Yes Jeanann Lewandowsky, MD     Objective:   Filed Vitals:   11/28/13 1003  BP: 160/103  Pulse: 65  Temp: 98 F (36.7 C)  TempSrc: Oral  Resp: 16  Height: 5\' 7"  (1.702 m)  Weight: 122 lb (55.339 kg)  SpO2: 100%    Exam General appearance : Awake, alert, not in any distress. Speech Clear. Not toxic looking HEENT: Atraumatic and Normocephalic, pupils equally reactive to light and accomodation Neck: supple, no JVD. No cervical lymphadenopathy.  Chest:Good air entry bilaterally, no added sounds  CVS: S1 S2 regular, no murmurs.  Abdomen: Bowel sounds present, Non tender and not distended with no gaurding, rigidity or rebound. Extremities: B/L Lower Ext shows no edema, both legs are warm to touch Neurology: Awake alert, and oriented X 3, CN II-XII intact, Non focal Skin:No Rash Wounds:N/A   Data Review   CBC  Recent Labs Lab 11/28/13 1052  WBC 6.6   HGB 14.1  HCT 40.8  PLT 295  MCV 81.6  MCH 28.2  MCHC 34.6  RDW 14.3  LYMPHSABS 2.1  MONOABS 0.4  EOSABS 0.1  BASOSABS 0.1    Chemistries    Recent Labs Lab 11/28/13 1052  NA 136  K 4.1  CL 98  CO2 29  GLUCOSE 95  BUN 13  CREATININE 1.11  CALCIUM 10.3  AST 19  ALT 10  ALKPHOS 68  BILITOT 0.3   ------------------------------------------------------------------------------------------------------------------ No results found for this basename: HGBA1C,  in the last 72 hours ------------------------------------------------------------------------------------------------------------------  Recent Labs  11/28/13 1052  CHOL 176  HDL 54  LDLCALC 110*  TRIG 62  CHOLHDL 3.3   ------------------------------------------------------------------------------------------------------------------ No results found for this basename: TSH, T4TOTAL, FREET3, T3FREE, THYROIDAB,  in the last 72 hours ------------------------------------------------------------------------------------------------------------------ No results found for this basename: VITAMINB12, FOLATE, FERRITIN, TIBC, IRON, RETICCTPCT,  in the last 72 hours  Coagulation profile  No results found for this basename: INR, PROTIME,  in the last 168 hours    Assessment & Plan   Repeat blood pressure in the clinic showed 140/88 mmHg  1. Essential hypertension, benign Continue - lisinopril-hydrochlorothiazide (PRINZIDE,ZESTORETIC) 20-25 MG per tablet; Take 1 tablet by mouth daily.  Dispense: 90 tablet; Refill: 4 - CMP and Liver - CBC with Differential - Lipid Panel  Return to clinic in 2 weeks for blood pressure check Patient has been counseled about nutrition and exercise  2. Need  for prophylactic vaccination and inoculation against influenza Flu shot given  Patient counseled extensively about smoking cessation  Follow up in 3-6 months or when necessary   The patient was given clear instructions to go  to ER or return to medical center if symptoms don't improve, worsen or new problems develop. The patient verbalized understanding. The patient was told to call to get lab results if they haven't heard anything in the next week.    Jeanann Lewandowsky, MD, MHA, FACP, FAAP [facility] [place], [place] [phone]   11/29/2013, 2:56 PM

## 2013-12-03 ENCOUNTER — Telehealth: Payer: Self-pay | Admitting: Internal Medicine

## 2013-12-03 NOTE — Telephone Encounter (Signed)
Pt called in today to see if he could get his refill for lisinopril-hydrochlorothiazide (PRINZIDE,ZESTORETIC) 20-25 MG per tablet sent to the [facility]; Please call pt to let him know when script has been sent

## 2013-12-04 ENCOUNTER — Other Ambulatory Visit: Payer: Self-pay | Admitting: Internal Medicine

## 2013-12-04 DIAGNOSIS — I10 Essential (primary) hypertension: Secondary | ICD-10-CM

## 2013-12-04 MED ORDER — LISINOPRIL-HYDROCHLOROTHIAZIDE 20-25 MG PO TABS: 1.0000 | ORAL_TABLET | Freq: Every day | ORAL | Status: DC

## 2013-12-04 NOTE — Telephone Encounter (Signed)
Prescription called into [facility], patient informed

## 2013-12-10 ENCOUNTER — Ambulatory Visit: Payer: Self-pay

## 2014-01-15 ENCOUNTER — Encounter: Payer: Self-pay | Admitting: Internal Medicine

## 2014-01-20 ENCOUNTER — Telehealth: Payer: Self-pay | Admitting: *Deleted

## 2014-01-20 NOTE — Telephone Encounter (Signed)
Message copied by Raynelle CharyWINFREE, Jennaya Pogue R on Mon Jan 20, 2014 10:41 AM ------      Message from: Jeanann LewandowskyJEGEDE, OLUGBEMIGA E      Created: Wed Jan 15, 2014  6:18 PM       Please inform patient that his lab results all within normal limit except a slightly elevated cholesterol. We advised dietary control of low fat low cholesterol diet and regular physical exercise. We'll repeat the test in about 6 months ------

## 2014-01-20 NOTE — Telephone Encounter (Signed)
I spoke to the pt and informed him about his lab results. I instructed the pt to start eating healthy and exercise daily.

## 2014-05-29 ENCOUNTER — Ambulatory Visit: Payer: Self-pay | Admitting: Internal Medicine

## 2014-07-14 ENCOUNTER — Ambulatory Visit: Payer: Self-pay | Attending: Internal Medicine | Admitting: Internal Medicine

## 2014-07-14 ENCOUNTER — Encounter: Payer: Self-pay | Admitting: Internal Medicine

## 2014-07-14 VITALS — BP 167/81 | HR 60 | Temp 98.3°F | Resp 16 | Ht 67.0 in | Wt 119.0 lb

## 2014-07-14 DIAGNOSIS — F17200 Nicotine dependence, unspecified, uncomplicated: Secondary | ICD-10-CM | POA: Insufficient documentation

## 2014-07-14 DIAGNOSIS — I10 Essential (primary) hypertension: Secondary | ICD-10-CM | POA: Insufficient documentation

## 2014-07-14 DIAGNOSIS — Z79899 Other long term (current) drug therapy: Secondary | ICD-10-CM | POA: Insufficient documentation

## 2014-07-14 NOTE — Progress Notes (Signed)
Patient ID: Martin NormanJerry Mathwig, male   DOB: [birth year], 58 y.o.   MRN: [id number]  CC: follow up  HPI: Male with past medical history of hypertension who comes in for followup. Patient reports not taking blood pressure medication this morning. He has no chest pain, shortness of breath or palpitations. He feels good this morning.  No Known Allergies Past Medical History  Diagnosis Date  . Hypertension    Current Outpatient Prescriptions on File Prior to Visit  Medication Sig Dispense Refill  . lisinopril-hydrochlorothiazide (PRINZIDE,ZESTORETIC) 20-25 MG per tablet Take 1 tablet by mouth daily.  90 tablet  4  . [DISCONTINUED] lisinopril (PRINIVIL,ZESTRIL) 20 MG tablet Take 20 mg by mouth daily.       No current facility-administered medications on file prior to visit.   Family History  Problem Relation Age of Onset  . Hypertension Mother   . Cancer Mother   . Hypertension Father    History   Social History  . Marital Status: Single    Spouse Name: N/A    Number of Children: N/A  . Years of Education: N/A   Occupational History  . Not on file.   Social History Main Topics  . Smoking status: Current Every Day Smoker -- 0.50 packs/day    Types: Cigarettes  . Smokeless tobacco: Not on file  . Alcohol Use: No  . Drug Use: No  . Sexual Activity: Not on file   Other Topics Concern  . Not on file   Social History Narrative  . No narrative on file    Review of Systems  Constitutional: Negative for fever, chills, diaphoresis, activity change, appetite change and fatigue.  HENT: Negative for ear pain, nosebleeds, congestion, facial swelling, rhinorrhea, neck pain, neck stiffness and ear discharge.   Eyes: Negative for pain, discharge, redness, itching and visual disturbance.  Respiratory: Negative for cough, choking, chest tightness, shortness of breath, wheezing and stridor.   Cardiovascular: Negative for chest pain, palpitations and leg swelling.  Gastrointestinal: Negative for  abdominal distention.  Genitourinary: Negative for dysuria, urgency, frequency, hematuria, flank pain, decreased urine volume, difficulty urinating and dyspareunia.  Musculoskeletal: Negative for back pain, joint swelling, arthralgias and gait problem.  Neurological: Negative for dizziness, tremors, seizures, syncope, facial asymmetry, speech difficulty, weakness, light-headedness, numbness and headaches.  Hematological: Negative for adenopathy. Does not bruise/bleed easily.  Psychiatric/Behavioral: Negative for hallucinations, behavioral problems, confusion, dysphoric mood, decreased concentration and agitation.    Objective:   Filed Vitals:   07/14/14 1500  BP: 167/81  Pulse:   Temp:   Resp:     Physical Exam  Constitutional: Appears well-developed and well-nourished. No distress.  HENT: Normocephalic. External right and left ear normal. Oropharynx is clear and moist.  Eyes: Conjunctivae and EOM are normal. PERRLA, no scleral icterus.  Neck: Normal ROM. Neck supple. No JVD. No tracheal deviation. No thyromegaly.  CVS: RRR, S1/S2 +, no murmurs, no gallops, no carotid bruit.  Pulmonary: Effort and breath sounds normal, no stridor, rhonchi, wheezes, rales.  Abdominal: Soft. BS +,  no distension, tenderness, rebound or guarding.  Musculoskeletal: Normal range of motion. No edema and no tenderness.  Lymphadenopathy: No lymphadenopathy noted, cervical, inguinal. Neuro: Alert. Normal reflexes, muscle tone coordination. No cranial nerve deficit. Skin: Skin is warm and dry. No rash noted. Not diaphoretic. No erythema. No pallor.  Psychiatric: Normal mood and affect. Behavior, judgment, thought content normal.   Lab Results  Component Value Date   WBC 6.6 11/28/2013   HGB 14.1 11/28/2013  HCT 40.8 11/28/2013   MCV 81.6 11/28/2013   PLT 295 11/28/2013   Lab Results  Component Value Date   CREATININE 1.11 11/28/2013   BUN 13 11/28/2013   NA 136 11/28/2013   K 4.1 11/28/2013   CL 98  11/28/2013   CO2 29 11/28/2013    Lab Results  Component Value Date   HGBA1C 5.0 12/02/2009   Lipid Panel     Component Value Date/Time   CHOL 176 11/28/2013 1052   TRIG 62 11/28/2013 1052   HDL 54 11/28/2013 1052   CHOLHDL 3.3 11/28/2013 1052   VLDL 12 11/28/2013 1052   LDLCALC 110* 11/28/2013 1052       Assessment and plan:   Patient Active Problem List   Diagnosis Date Noted  . Essential hypertension, benign - We have discussed target BP range - I have advised pt to check BP regularly and to call us back if the numbers are higher than 140/90 - discussed the importance of compliance with medical therapy and diet  - encuraged compliance, he did not take BP med this am 11/28/2013

## 2014-07-14 NOTE — Progress Notes (Signed)
Pt is here following up on his HTN. Pt has a knot on the bottom of his right foot.

## 2014-07-14 NOTE — Patient Instructions (Signed)
I will be a niceHypertension Hypertension, commonly called high blood pressure, is when the force of blood pumping through your arteries is too strong. Your arteries are the blood vessels that carry blood from your heart throughout your body. A blood pressure reading consists of a higher number over a lower number, such as 110/72. The higher number (systolic) is the pressure inside your arteries when your heart pumps. The lower number (diastolic) is the pressure inside your arteries when your heart relaxes. Ideally you want your blood pressure below 120/80. Hypertension forces your heart to work harder to pump blood. Your arteries may become narrow or stiff. Having hypertension puts you at risk for heart disease, stroke, and other problems.  RISK FACTORS Some risk factors for high blood pressure are controllable. Others are not.  Risk factors you cannot control include:   Race. You may be at higher risk if you are African American.  Age. Risk increases with age.  Gender. Men are at higher risk than women before age 75 years. After age 42, women are at higher risk than men. Risk factors you can control include:  Not getting enough exercise or physical activity.  Being overweight.  Getting too much fat, sugar, calories, or salt in your diet.  Drinking too much alcohol. SIGNS AND SYMPTOMS Hypertension does not usually cause signs or symptoms. Extremely high blood pressure (hypertensive crisis) may cause headache, anxiety, shortness of breath, and nosebleed. DIAGNOSIS  To check if you have hypertension, your health care provider will measure your blood pressure while you are seated, with your arm held at the level of your heart. It should be measured at least twice using the same arm. Certain conditions can cause a difference in blood pressure between your right and left arms. A blood pressure reading that is higher than normal on one occasion does not mean that you need treatment. If one blood  pressure reading is high, ask your health care provider about having it checked again. TREATMENT  Treating high blood pressure includes making lifestyle changes and possibly taking medication. Living a healthy lifestyle can help lower high blood pressure. You may need to change some of your habits. Lifestyle changes may include:  Following the DASH diet. This diet is high in fruits, vegetables, and whole grains. It is low in salt, red meat, and added sugars.  Getting at least 2 1/2 hours of brisk physical activity every week.  Losing weight if necessary.  Not smoking.  Limiting alcoholic beverages.  Learning ways to reduce stress. If lifestyle changes are not enough to get your blood pressure under control, your health care provider may prescribe medicine. You may need to take more than one. Work closely with your health care provider to understand the risks and benefits. HOME CARE INSTRUCTIONS  Have your blood pressure rechecked as directed by your health care provider.   Only take medicine as directed by your health care provider. Follow the directions carefully. Blood pressure medicines must be taken as prescribed. The medicine does not work as well when you skip doses. Skipping doses also puts you at risk for problems.   Do not smoke.   Monitor your blood pressure at home as directed by your health care provider. SEEK MEDICAL CARE IF:   You think you are having a reaction to medicines taken.  You have recurrent headaches or feel dizzy.  You have swelling in your ankles.  You have trouble with your vision. SEEK IMMEDIATE MEDICAL CARE IF:  You develop  a severe headache or confusion.  You have unusual weakness, numbness, or feel faint.  You have severe chest or abdominal pain.  You vomit repeatedly.  You have trouble breathing. MAKE SURE YOU:   Understand these instructions.  Will watch your condition.  Will get help right away if you are not doing well or  get worse. Document Released: 12/12/2005 Document Revised: 12/17/2013 Document Reviewed: 10/04/2013 [facility] Patient Information 2015 [place]. This information is not intended to replace advice given to you by your health care provider. Make sure you discuss any questions you have with your health care provider.

## 2014-12-12 ENCOUNTER — Other Ambulatory Visit: Payer: Self-pay | Admitting: Internal Medicine

## 2014-12-16 ENCOUNTER — Encounter: Payer: Self-pay | Admitting: Internal Medicine

## 2014-12-16 ENCOUNTER — Ambulatory Visit (HOSPITAL_BASED_OUTPATIENT_CLINIC_OR_DEPARTMENT_OTHER): Payer: Self-pay | Admitting: *Deleted

## 2014-12-16 ENCOUNTER — Ambulatory Visit: Payer: Self-pay | Attending: Internal Medicine | Admitting: Internal Medicine

## 2014-12-16 DIAGNOSIS — Z23 Encounter for immunization: Secondary | ICD-10-CM

## 2014-12-16 DIAGNOSIS — I10 Essential (primary) hypertension: Secondary | ICD-10-CM

## 2014-12-16 DIAGNOSIS — F17210 Nicotine dependence, cigarettes, uncomplicated: Secondary | ICD-10-CM | POA: Insufficient documentation

## 2014-12-16 LAB — COMPLETE METABOLIC PANEL WITH GFR
ALBUMIN: 4.5 g/dL (ref 3.5–5.2)
ALK PHOS: 59 U/L (ref 39–117)
ALT: 9 U/L (ref 0–53)
AST: 19 U/L (ref 0–37)
BUN: 13 mg/dL (ref 6–23)
CO2: 29 mEq/L (ref 19–32)
Calcium: 9.8 mg/dL (ref 8.4–10.5)
Chloride: 100 mEq/L (ref 96–112)
Creat: 1.25 mg/dL (ref 0.50–1.35)
GFR, Est African American: 72 mL/min
GFR, Est Non African American: 62 mL/min
Glucose, Bld: 74 mg/dL (ref 70–99)
POTASSIUM: 4.4 meq/L (ref 3.5–5.3)
Sodium: 138 mEq/L (ref 135–145)
Total Bilirubin: 0.5 mg/dL (ref 0.2–1.2)
Total Protein: 7.4 g/dL (ref 6.0–8.3)

## 2014-12-16 LAB — CBC WITH DIFFERENTIAL/PLATELET
BASOS ABS: 0.1 10*3/uL (ref 0.0–0.1)
BASOS PCT: 1 % (ref 0–1)
Eosinophils Absolute: 0.3 10*3/uL (ref 0.0–0.7)
Eosinophils Relative: 4 % (ref 0–5)
HCT: 37.5 % — ABNORMAL LOW (ref 39.0–52.0)
Hemoglobin: 13.1 g/dL (ref 13.0–17.0)
Lymphocytes Relative: 29 % (ref 12–46)
Lymphs Abs: 2 10*3/uL (ref 0.7–4.0)
MCH: 28.4 pg (ref 26.0–34.0)
MCHC: 34.9 g/dL (ref 30.0–36.0)
MCV: 81.3 fL (ref 78.0–100.0)
MONO ABS: 0.5 10*3/uL (ref 0.1–1.0)
MPV: 8.4 fL — ABNORMAL LOW (ref 9.4–12.4)
Monocytes Relative: 7 % (ref 3–12)
NEUTROS ABS: 4.1 10*3/uL (ref 1.7–7.7)
NEUTROS PCT: 59 % (ref 43–77)
PLATELETS: 280 10*3/uL (ref 150–400)
RBC: 4.61 MIL/uL (ref 4.22–5.81)
RDW: 14.1 % (ref 11.5–15.5)
WBC: 6.9 10*3/uL (ref 4.0–10.5)

## 2014-12-16 LAB — POCT GLYCOSYLATED HEMOGLOBIN (HGB A1C): HEMOGLOBIN A1C: 5.5

## 2014-12-16 LAB — LIPID PANEL
CHOL/HDL RATIO: 3.4 ratio
CHOLESTEROL: 171 mg/dL (ref 0–200)
HDL: 50 mg/dL (ref >39)
LDL Cholesterol: 108 mg/dL — ABNORMAL HIGH (ref 0–99)
TRIGLYCERIDES: 63 mg/dL (ref <150)
VLDL: 13 mg/dL (ref 0–40)

## 2014-12-16 MED ORDER — LISINOPRIL-HYDROCHLOROTHIAZIDE 20-25 MG PO TABS: 1.0000 | ORAL_TABLET | Freq: Every day | ORAL | Status: DC

## 2014-12-16 NOTE — Patient Instructions (Signed)
Hypertension Hypertension, commonly called high blood pressure, is when the force of blood pumping through your arteries is too strong. Your arteries are the blood vessels that carry blood from your heart throughout your body. A blood pressure reading consists of a higher number over a lower number, such as 110/72. The higher number (systolic) is the pressure inside your arteries when your heart pumps. The lower number (diastolic) is the pressure inside your arteries when your heart relaxes. Ideally you want your blood pressure below 120/80. Hypertension forces your heart to work harder to pump blood. Your arteries may become narrow or stiff. Having hypertension puts you at risk for heart disease, stroke, and other problems.  RISK FACTORS Some risk factors for high blood pressure are controllable. Others are not.  Risk factors you cannot control include:   Race. You may be at higher risk if you are African American.  Age. Risk increases with age.  Gender. Men are at higher risk than women before age 45 years. After age 65, women are at higher risk than men. Risk factors you can control include:  Not getting enough exercise or physical activity.  Being overweight.  Getting too much fat, sugar, calories, or salt in your diet.  Drinking too much alcohol. SIGNS AND SYMPTOMS Hypertension does not usually cause signs or symptoms. Extremely high blood pressure (hypertensive crisis) may cause headache, anxiety, shortness of breath, and nosebleed. DIAGNOSIS  To check if you have hypertension, your health care provider will measure your blood pressure while you are seated, with your arm held at the level of your heart. It should be measured at least twice using the same arm. Certain conditions can cause a difference in blood pressure between your right and left arms. A blood pressure reading that is higher than normal on one occasion does not mean that you need treatment. If one blood pressure reading  is high, ask your health care provider about having it checked again. TREATMENT  Treating high blood pressure includes making lifestyle changes and possibly taking medicine. Living a healthy lifestyle can help lower high blood pressure. You may need to change some of your habits. Lifestyle changes may include:  Following the DASH diet. This diet is high in fruits, vegetables, and whole grains. It is low in salt, red meat, and added sugars.  Getting at least 2 hours of brisk physical activity every week.  Losing weight if necessary.  Not smoking.  Limiting alcoholic beverages.  Learning ways to reduce stress. If lifestyle changes are not enough to get your blood pressure under control, your health care provider may prescribe medicine. You may need to take more than one. Work closely with your health care provider to understand the risks and benefits. HOME CARE INSTRUCTIONS  Have your blood pressure rechecked as directed by your health care provider.   Take medicines only as directed by your health care provider. Follow the directions carefully. Blood pressure medicines must be taken as prescribed. The medicine does not work as well when you skip doses. Skipping doses also puts you at risk for problems.   Do not smoke.   Monitor your blood pressure at home as directed by your health care provider. SEEK MEDICAL CARE IF:   You think you are having a reaction to medicines taken.  You have recurrent headaches or feel dizzy.  You have swelling in your ankles.  You have trouble with your vision. SEEK IMMEDIATE MEDICAL CARE IF:  You develop a severe headache or confusion.    You have unusual weakness, numbness, or feel faint.  You have severe chest or abdominal pain.  You vomit repeatedly.  You have trouble breathing. MAKE SURE YOU:   Understand these instructions.  Will watch your condition.  Will get help right away if you are not doing well or get worse. Document  Released: 12/12/2005 Document Revised: 04/28/2014 Document Reviewed: 10/04/2013 ExitCare Patient Information 2015 Exit[employer]. This information is not intended to replace advice given to you by your health care provider. Make sure you discuss any questions you have with your health care provider. DASH Eating Plan DASH stands for "Dietary Approaches to Stop Hypertension." The DASH eating plan is a healthy eating plan that has been shown to reduce high blood pressure (hypertension). Additional health benefits may include reducing the risk of type 2 diabetes mellitus, heart disease, and stroke. The DASH eating plan may also help with weight loss. WHAT DO I NEED TO KNOW ABOUT THE DASH EATING PLAN? For the DASH eating plan, you will follow these general guidelines:  Choose foods with a percent daily value for sodium of less than 5% (as listed on the food label).  Use salt-free seasonings or herbs instead of table salt or sea salt.  Check with your health care provider or pharmacist before using salt substitutes.  Eat lower-sodium products, often labeled as "lower sodium" or "no salt added."  Eat fresh foods.  Eat more vegetables, fruits, and low-fat dairy products.  Choose whole grains. Look for the word "whole" as the first word in the ingredient list.  Choose fish and skinless chicken or turkey more often than red meat. Limit fish, poultry, and meat to 6 oz (170 g) each day.  Limit sweets, desserts, sugars, and sugary drinks.  Choose heart-healthy fats.  Limit cheese to 1 oz (28 g) per day.  Eat more home-cooked food and less restaurant, buffet, and fast food.  Limit fried foods.  Cook foods using methods other than frying.  Limit canned vegetables. If you do use them, rinse them well to decrease the sodium.  When eating at a restaurant, ask that your food be prepared with less salt, or no salt if possible. WHAT FOODS CAN I EAT? Seek help from a dietitian for individual  calorie needs. Grains Whole grain or whole wheat bread. Brown rice. Whole grain or whole wheat pasta. Quinoa, bulgur, and whole grain cereals. Low-sodium cereals. Corn or whole wheat flour tortillas. Whole grain cornbread. Whole grain crackers. Low-sodium crackers. Vegetables Fresh or frozen vegetables (raw, steamed, roasted, or grilled). Low-sodium or reduced-sodium tomato and vegetable juices. Low-sodium or reduced-sodium tomato sauce and paste. Low-sodium or reduced-sodium canned vegetables.  Fruits All fresh, canned (in natural juice), or frozen fruits. Meat and Other Protein Products Ground beef (85% or leaner), grass-fed beef, or beef trimmed of fat. Skinless chicken or turkey. Ground chicken or turkey. Pork trimmed of fat. All fish and seafood. Eggs. Dried beans, peas, or lentils. Unsalted nuts and seeds. Unsalted canned beans. Dairy Low-fat dairy products, such as skim or 1% milk, 2% or reduced-fat cheeses, low-fat ricotta or cottage cheese, or plain low-fat yogurt. Low-sodium or reduced-sodium cheeses. Fats and Oils Tub margarines without trans fats. Light or reduced-fat mayonnaise and salad dressings (reduced sodium). Avocado. Safflower, olive, or canola oils. Natural peanut or almond butter. Other Unsalted popcorn and pretzels. The items listed above may not be a complete list of recommended foods or beverages. Contact your dietitian for more options. WHAT FOODS ARE NOT RECOMMENDED? Grains White bread.   White pasta. White rice. Refined cornbread. Bagels and croissants. Crackers that contain trans fat. Vegetables Creamed or fried vegetables. Vegetables in a cheese sauce. Regular canned vegetables. Regular canned tomato sauce and paste. Regular tomato and vegetable juices. Fruits Dried fruits. Canned fruit in light or heavy syrup. Fruit juice. Meat and Other Protein Products Fatty cuts of meat. Ribs, chicken wings, bacon, sausage, bologna, salami, chitterlings, fatback, hot dogs,  bratwurst, and packaged luncheon meats. Salted nuts and seeds. Canned beans with salt. Dairy Whole or 2% milk, cream, half-and-half, and cream cheese. Whole-fat or sweetened yogurt. Full-fat cheeses or blue cheese. Nondairy creamers and whipped toppings. Processed cheese, cheese spreads, or cheese curds. Condiments Onion and garlic salt, seasoned salt, table salt, and sea salt. Canned and packaged gravies. Worcestershire sauce. Tartar sauce. Barbecue sauce. Teriyaki sauce. Soy sauce, including reduced sodium. Steak sauce. Fish sauce. Oyster sauce. Cocktail sauce. Horseradish. Ketchup and mustard. Meat flavorings and tenderizers. Bouillon cubes. Hot sauce. Tabasco sauce. Marinades. Taco seasonings. Relishes. Fats and Oils Butter, stick margarine, lard, shortening, ghee, and bacon fat. Coconut, palm kernel, or palm oils. Regular salad dressings. Other Pickles and olives. Salted popcorn and pretzels. The items listed above may not be a complete list of foods and beverages to avoid. Contact your dietitian for more information. WHERE CAN I FIND MORE INFORMATION? National Heart, Lung, and Blood Institute: www.nhlbi.nih.gov/health/health-topics/topics/dash/ Document Released: 12/01/2011 Document Revised: 04/28/2014 Document Reviewed: 10/16/2013 ExitCare Patient Information 2015 Exit[employer], LLC. This information is not intended to replace advice given to you by your health care provider. Make sure you discuss any questions you have with your health care provider.  

## 2014-12-16 NOTE — Progress Notes (Signed)
Pt is here following up on his HTN. Pt needs his BP medication refilled. Pt is currently smoking.

## 2015-01-01 ENCOUNTER — Telehealth: Payer: Self-pay | Admitting: Emergency Medicine

## 2015-01-01 NOTE — Telephone Encounter (Signed)
Pt given lab results with instructions to continue low cholesterol diet/exercise

## 2015-01-01 NOTE — Telephone Encounter (Signed)
-----   Message from Quentin Angstlugbemiga E Jegede, MD sent at 12/21/2014  2:46 PM EST ----- Please inform patient that his laboratory test results are mostly within normal limits, although his cholesterol is slightly elevated, will encourage control with diet only at this time. Please advise patient on low cholesterol, low fat diet as well as regular physical exercise at least 3 times a week 30 minutes each time.

## 2015-03-06 NOTE — Progress Notes (Signed)
Patient ID: Martin Mason, male   DOB: [birth year], 59 y.o.   MRN: [id number]   Martin Mason, is a 59 y.o. male  [id number]  [id number]  DOB - [birth year]  Chief Complaint  Patient presents with  . Follow-up        Subjective:   Martin Mason is a 59 y.o. male here today for a follow up visit. Patient is here today for routine follow-up of hypertension. He also needs refill of his medication. He has no significant complaints today. He is currently on lisinopril-hydrochlorothiazide, blood pressure is controlled. Patient continues to smoke about 1 pack of cigarettes per day. Patient has No headache, No chest pain, No abdominal pain - No Nausea, No new weakness tingling or numbness, No Cough - SOB.  No problems updated.  ALLERGIES: No Known Allergies  PAST MEDICAL HISTORY: Past Medical History  Diagnosis Date  . Hypertension     MEDICATIONS AT HOME: Prior to Admission medications   Medication Sig Start Date End Date Taking? Authorizing Provider  lisinopril-hydrochlorothiazide (PRINZIDE,ZESTORETIC) 20-25 MG per tablet Take 1 tablet by mouth daily. 12/16/14  Yes Quentin Angstlugbemiga E Jaliah Foody, MD     Objective:   There were no vitals filed for this visit.  Exam General appearance : Awake, alert, not in any distress. Speech Clear. Not toxic looking HEENT: Atraumatic and Normocephalic, pupils equally reactive to light and accomodation Neck: supple, no JVD. No cervical lymphadenopathy.  Chest:Good air entry bilaterally, no added sounds  CVS: S1 S2 regular, no murmurs.  Abdomen: Bowel sounds present, Non tender and not distended with no gaurding, rigidity or rebound. Extremities: B/L Lower Ext shows no edema, both legs are warm to touch Neurology: Awake alert, and oriented X 3, CN II-XII intact, Non focal Skin:No Rash Wounds:N/A  Data Review Lab Results  Component Value Date   HGBA1C 5.5 12/16/2014   HGBA1C 5.0 12/02/2009     Assessment & Plan   1. Essential hypertension,  benign  - lisinopril-hydrochlorothiazide (PRINZIDE,ZESTORETIC) 20-25 MG per tablet; Take 1 tablet by mouth daily.  Dispense: 90 tablet; Refill: 3 - CBC with Differential - COMPLETE METABOLIC PANEL WITH GFR - POCT glycosylated hemoglobin (Hb A1C) - Lipid panel - DASH Diet  The patient was counseled on the dangers of tobacco use, and was advised to quit. Reviewed strategies to maximize success, including removing cigarettes and smoking materials from environment, stress management and support of family/friends.   Return in about 6 months (around 06/17/2015) for Follow up HTN.  The patient was given clear instructions to go to ER or return to medical center if symptoms don't improve, worsen or new problems develop. The patient verbalized understanding. The patient was told to call to get lab results if they haven't heard anything in the next week.   This note has been created with Education officer, environmentalDragon speech recognition software and smart phrase technology. Any transcriptional errors are unintentional.    Jeanann LewandowskyJEGEDE, Devron Cohick, MD, MHA, FACP, FAAP [facility] [place], [place] [phone]   12/16/2014

## 2015-06-18 ENCOUNTER — Ambulatory Visit: Payer: Self-pay | Attending: Internal Medicine | Admitting: Internal Medicine

## 2015-06-18 ENCOUNTER — Encounter: Payer: Self-pay | Admitting: Internal Medicine

## 2015-06-18 VITALS — BP 137/74 | HR 65 | Temp 98.6°F | Resp 18 | Ht 69.0 in | Wt 119.6 lb

## 2015-06-18 DIAGNOSIS — N528 Other male erectile dysfunction: Secondary | ICD-10-CM

## 2015-06-18 DIAGNOSIS — N529 Male erectile dysfunction, unspecified: Secondary | ICD-10-CM | POA: Insufficient documentation

## 2015-06-18 DIAGNOSIS — I10 Essential (primary) hypertension: Secondary | ICD-10-CM | POA: Insufficient documentation

## 2015-06-18 DIAGNOSIS — F17210 Nicotine dependence, cigarettes, uncomplicated: Secondary | ICD-10-CM | POA: Insufficient documentation

## 2015-06-18 MED ORDER — SILDENAFIL CITRATE 50 MG PO TABS: 50.0000 mg | ORAL_TABLET | Freq: Every day | ORAL | Status: DC | PRN

## 2015-06-18 MED ORDER — LISINOPRIL-HYDROCHLOROTHIAZIDE 20-25 MG PO TABS: 1.0000 | ORAL_TABLET | Freq: Every day | ORAL | Status: DC

## 2015-06-18 NOTE — Patient Instructions (Addendum)
Erectile Dysfunction Erectile dysfunction is the inability to get or sustain a good enough erection to have sexual intercourse. Erectile dysfunction may involve:  Inability to get an erection.  Lack of enough hardness to allow penetration.  Loss of the erection before sex is finished.  Premature ejaculation. CAUSES  Certain drugs, such as:  Pain relievers.  Antihistamines.  Antidepressants.  Blood pressure medicines.  Water pills (diuretics).  Ulcer medicines.  Muscle relaxants.  Illegal drugs.  Excessive drinking.  Psychological causes, such as:  Anxiety.  Depression.  Sadness.  Exhaustion.  Performance fear.  Stress.  Physical causes, such as:  Artery problems. This may include diabetes, smoking, liver disease, or atherosclerosis.  High blood pressure.  Hormonal problems, such as low testosterone.  Obesity.  Nerve problems. This may include back or pelvic injuries, diabetes mellitus, multiple sclerosis, or Parkinson disease. SYMPTOMS  Inability to get an erection.  Lack of enough hardness to allow penetration.  Loss of the erection before sex is finished.  Premature ejaculation.  Normal erections at some times, but with frequent unsatisfactory episodes.  Orgasms that are not satisfactory in sensation or frequency.  Low sexual satisfaction in either partner because of erection problems.  A curved penis occurring with erection. The curve may cause pain or may be too curved to allow for intercourse.  Never having nighttime erections. DIAGNOSIS Your caregiver can often diagnose this condition by:  Performing a physical exam to find other diseases or specific problems with the penis.  Asking you detailed questions about the problem.  Performing blood tests to check for diabetes mellitus or to measure hormone levels.  Performing urine tests to find other underlying health conditions.  Performing an ultrasound exam to check for  scarring.  Performing a test to check blood flow to the penis.  Doing a sleep study at home to measure nighttime erections. TREATMENT   You may be prescribed medicines by mouth.  You may be given medicine injections into the penis.  You may be prescribed a vacuum pump with a ring.  Penile implant surgery may be performed. You may receive:  An inflatable implant.  A semirigid implant.  Blood vessel surgery may be performed. HOME CARE INSTRUCTIONS  If you are prescribed oral medicine, you should take the medicine as prescribed. Do not increase the dosage without first discussing it with your physician.  If you are using self-injections, be careful to avoid any veins that are on the surface of the penis. Apply pressure to the injection site for 5 minutes.  If you are using a vacuum pump, make sure you have read the instructions before using it. Discuss any questions with your physician before taking the pump home. SEEK MEDICAL CARE IF:  You experience pain that is not responsive to the pain medicine you have been prescribed.  You experience nausea or vomiting. SEEK IMMEDIATE MEDICAL CARE IF:   When taking oral or injectable medications, you experience an erection that lasts longer than 4 hours. If your physician is unavailable, go to the nearest emergency room for evaluation. An erection that lasts much longer than 4 hours can result in permanent damage to your penis.  You have pain that is severe.  You develop redness, severe pain, or severe swelling of your penis.  You have redness spreading up into your groin or lower abdomen.  You are unable to pass your urine. Document Released: 12/09/2000 Document Revised: 08/14/2013 Document Reviewed: 05/16/2013 [employer] Patient Information 2015 [employer]. This information is not   intended to replace advice given to you by your health care provider. Make sure you discuss any questions you have with your health care  provider. Hypertension Hypertension, commonly called high blood pressure, is when the force of blood pumping through your arteries is too strong. Your arteries are the blood vessels that carry blood from your heart throughout your body. A blood pressure reading consists of a higher number over a lower number, such as 110/72. The higher number (systolic) is the pressure inside your arteries when your heart pumps. The lower number (diastolic) is the pressure inside your arteries when your heart relaxes. Ideally you want your blood pressure below 120/80. Hypertension forces your heart to work harder to pump blood. Your arteries may become narrow or stiff. Having hypertension puts you at risk for heart disease, stroke, and other problems.  RISK FACTORS Some risk factors for high blood pressure are controllable. Others are not.  Risk factors you cannot control include:   Race. You may be at higher risk if you are African American.  Age. Risk increases with age.  Gender. Men are at higher risk than women before age 61 years. After age 61, women are at higher risk than men. Risk factors you can control include:  Not getting enough exercise or physical activity.  Being overweight.  Getting too much fat, sugar, calories, or salt in your diet.  Drinking too much alcohol. SIGNS AND SYMPTOMS Hypertension does not usually cause signs or symptoms. Extremely high blood pressure (hypertensive crisis) may cause headache, anxiety, shortness of breath, and nosebleed. DIAGNOSIS  To check if you have hypertension, your health care provider will measure your blood pressure while you are seated, with your arm held at the level of your heart. It should be measured at least twice using the same arm. Certain conditions can cause a difference in blood pressure between your right and left arms. A blood pressure reading that is higher than normal on one occasion does not mean that you need treatment. If one blood  pressure reading is high, ask your health care provider about having it checked again. TREATMENT  Treating high blood pressure includes making lifestyle changes and possibly taking medicine. Living a healthy lifestyle can help lower high blood pressure. You may need to change some of your habits. Lifestyle changes may include:  Following the DASH diet. This diet is high in fruits, vegetables, and whole grains. It is low in salt, red meat, and added sugars.  Getting at least 2 hours of brisk physical activity every week.  Losing weight if necessary.  Not smoking.  Limiting alcoholic beverages.  Learning ways to reduce stress. If lifestyle changes are not enough to get your blood pressure under control, your health care provider may prescribe medicine. You may need to take more than one. Work closely with your health care provider to understand the risks and benefits. HOME CARE INSTRUCTIONS  Have your blood pressure rechecked as directed by your health care provider.   Take medicines only as directed by your health care provider. Follow the directions carefully. Blood pressure medicines must be taken as prescribed. The medicine does not work as well when you skip doses. Skipping doses also puts you at risk for problems.   Do not smoke.   Monitor your blood pressure at home as directed by your health care provider. SEEK MEDICAL CARE IF:   You think you are having a reaction to medicines taken.  You have recurrent headaches or feel dizzy.  You have swelling in your ankles.  You have trouble with your vision. SEEK IMMEDIATE MEDICAL CARE IF:  You develop a severe headache or confusion.  You have unusual weakness, numbness, or feel faint.  You have severe chest or abdominal pain.  You vomit repeatedly.  You have trouble breathing. MAKE SURE YOU:   Understand these instructions.  Will watch your condition.  Will get help right away if you are not doing well or get  worse. Document Released: 12/12/2005 Document Revised: 04/28/2014 Document Reviewed: 10/04/2013 [facility] Patient Information 2015 [place], [place]. This information is not intended to replace advice given to you by your health care provider. Make sure you discuss any questions you have with your health care provider. DASH Eating Plan DASH stands for "Dietary Approaches to Stop Hypertension." The DASH eating plan is a healthy eating plan that has been shown to reduce high blood pressure (hypertension). Additional health benefits may include reducing the risk of type 2 diabetes mellitus, heart disease, and stroke. The DASH eating plan may also help with weight loss. WHAT DO I NEED TO KNOW ABOUT THE DASH EATING PLAN? For the DASH eating plan, you will follow these general guidelines:  Choose foods with a percent daily value for sodium of less than 5% (as listed on the food label).  Use salt-free seasonings or herbs instead of table salt or sea salt.  Check with your health care provider or pharmacist before using salt substitutes.  Eat lower-sodium products, often labeled as "lower sodium" or "no salt added."  Eat fresh foods.  Eat more vegetables, fruits, and low-fat dairy products.  Choose whole grains. Look for the word "whole" as the first word in the ingredient list.  Choose fish and skinless chicken or [place] more often than red meat. Limit fish, poultry, and meat to 6 oz (170 g) each day.  Limit sweets, desserts, sugars, and sugary drinks.  Choose heart-healthy fats.  Limit cheese to 1 oz (28 g) per day.  Eat more home-cooked food and less restaurant, buffet, and fast food.  Limit fried foods.  Cook foods using methods other than frying.  Limit canned vegetables. If you do use them, rinse them well to decrease the sodium.  When eating at a restaurant, ask that your food be prepared with less salt, or no salt if possible. WHAT FOODS CAN I EAT? Seek help from a dietitian for  individual calorie needs. Grains Whole grain or whole wheat bread. Brown rice. Whole grain or whole wheat pasta. Quinoa, bulgur, and whole grain cereals. Low-sodium cereals. Corn or whole wheat flour tortillas. Whole grain cornbread. Whole grain crackers. Low-sodium crackers. Vegetables Fresh or frozen vegetables (raw, steamed, roasted, or grilled). Low-sodium or reduced-sodium tomato and vegetable juices. Low-sodium or reduced-sodium tomato sauce and paste. Low-sodium or reduced-sodium canned vegetables.  Fruits All fresh, canned (in natural juice), or frozen fruits. Meat and Other Protein Products Ground beef (85% or leaner), grass-fed beef, or beef trimmed of fat. Skinless chicken or [place]. Ground chicken or [place]. Pork trimmed of fat. All fish and seafood. Eggs. Dried beans, peas, or lentils. Unsalted nuts and seeds. Unsalted canned beans. Dairy Low-fat dairy products, such as skim or 1% milk, 2% or reduced-fat cheeses, low-fat ricotta or cottage cheese, or plain low-fat yogurt. Low-sodium or reduced-sodium cheeses. Fats and Oils Tub margarines without trans fats. Light or reduced-fat mayonnaise and salad dressings (reduced sodium). Avocado. Safflower, olive, or canola oils. Natural peanut or almond butter. Other Unsalted popcorn and pretzels. The items  listed above may not be a complete list of recommended foods or beverages. Contact your dietitian for more options. WHAT FOODS ARE NOT RECOMMENDED? Grains White bread. White pasta. White rice. Refined cornbread. Bagels and croissants. Crackers that contain trans fat. Vegetables Creamed or fried vegetables. Vegetables in a cheese sauce. Regular canned vegetables. Regular canned tomato sauce and paste. Regular tomato and vegetable juices. Fruits Dried fruits. Canned fruit in light or heavy syrup. Fruit juice. Meat and Other Protein Products Fatty cuts of meat. Ribs, chicken wings, bacon, sausage, bologna, salami, chitterlings, fatback, hot  dogs, bratwurst, and packaged luncheon meats. Salted nuts and seeds. Canned beans with salt. Dairy Whole or 2% milk, cream, half-and-half, and cream cheese. Whole-fat or sweetened yogurt. Full-fat cheeses or blue cheese. Nondairy creamers and whipped toppings. Processed cheese, cheese spreads, or cheese curds. Condiments Onion and garlic salt, seasoned salt, table salt, and sea salt. Canned and packaged gravies. Worcestershire sauce. Tartar sauce. Barbecue sauce. Teriyaki sauce. Soy sauce, including reduced sodium. Steak sauce. Fish sauce. Oyster sauce. Cocktail sauce. Horseradish. Ketchup and mustard. Meat flavorings and tenderizers. Bouillon cubes. Hot sauce. Tabasco sauce. Marinades. Taco seasonings. Relishes. Fats and Oils Butter, stick margarine, lard, shortening, ghee, and bacon fat. Coconut, palm kernel, or palm oils. Regular salad dressings. Other Pickles and olives. Salted popcorn and pretzels. The items listed above may not be a complete list of foods and beverages to avoid. Contact your dietitian for more information. WHERE CAN I FIND MORE INFORMATION? National Heart, Lung, and [employer] Institute: [employer] Document Released: 12/01/2011 Document Revised: 04/28/2014 Document Reviewed: 10/16/2013 [facility] Patient Information 2015 [place], [place]. This information is not intended to replace advice given to you by your health care provider. Make sure you discuss any questions you have with your health care provider.

## 2015-06-18 NOTE — Progress Notes (Signed)
Patient ID: Martin Mason, male   DOB: 19-Nov-1956, 59 y.o.   MRN: [id number]   Martin Mason, is a 59 y.o. male  [id number]:[id number]  [id number]:[id number]  DOB - 01/19/1956  Chief Complaint  Patient presents with  . Follow-up  . Hypertension        Subjective:   Martin Mason is a 59 y.o. male here today for a follow up visit. Patient has history of hypertension controlled with lisinopril-hydrochlorothiazide, active smoker, has been smoking since age 51 recently trying to quit now reduced to about 3 cigarettes per day. He is here today for follow-up and medication refill. Blood pressure is controlled. Major complaint today is erectile dysfunction, patient is able to achieve erection but does not last longer than 2-3 minutes, he does not described any premature ejaculation. Patient has tried to adhere with low-salt diet, infrequent exercise. He is also planning to see a dentist for his poor dentition. 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 He quit 6 years ago. Patient has No headache, No chest pain, No abdominal pain - No Nausea, No new weakness tingling or numbness, No Cough - SOB.  Problem  Other Male Erectile Dysfunction    ALLERGIES: No Known Allergies  PAST MEDICAL HISTORY: Past Medical History  Diagnosis Date  . Hypertension     MEDICATIONS AT HOME: Prior to Admission medications   Medication Sig Start Date End Date Taking? Authorizing Provider  lisinopril-hydrochlorothiazide (PRINZIDE,ZESTORETIC) 20-25 MG per tablet Take 1 tablet by mouth daily. 06/18/15  Yes Quentin Angst, MD  sildenafil (VIAGRA) 50 MG tablet Take 1 tablet (50 mg total) by mouth daily as needed for erectile dysfunction. 06/18/15   Quentin Angst, MD     Objective:   Filed Vitals:   06/18/15 0914  BP: 137/74  Pulse: 65  Temp: 98.6 F (37 C)  TempSrc: Oral  Resp: 18  Height:   (1.753 m)  Weight: 119 lb 9.6 oz (54.25 kg)  SpO2: 100%    Exam General appearance : Awake, alert, not in any distress. Speech Clear. Not toxic looking, poor dentition, thin built HEENT: Atraumatic and Normocephalic, pupils equally reactive to light and accomodation Neck: supple, no JVD. No cervical lymphadenopathy.  Chest:Good air entry bilaterally, no added sounds  CVS: S1 S2 regular, no murmurs.  Abdomen: Bowel sounds present, Non tender and not distended with no gaurding, rigidity or rebound. Extremities: B/L Lower Ext shows no edema, both legs are warm to touch Neurology: Awake alert, and oriented X 3, CN II-XII intact, Non focal Skin:No Rash  Data Review Lab Results  Component Value Date   HGBA1C 5.5 12/16/2014   HGBA1C 5.0 12/02/2009     Assessment & Plan   1. Essential hypertension, benign  - lisinopril-hydrochlorothiazide (PRINZIDE,ZESTORETIC) 20-25 MG per tablet; Take 1 tablet by mouth daily.  Dispense: 90 tablet; Refill: 3  - We have discussed target BP range and blood pressure goal - I have advised patient to check BP regularly and to call us back or report to clinic if the numbers are consistently higher than 140/90  - We discussed the importance of compliance with medical therapy and DASH diet recommended, consequences of uncontrolled hypertension discussed.  - continue current BP medications  2. Other male erectile dysfunction  - sildenafil (VIAGRA) 50 MG tablet; Take 1 tablet (50 mg total) by mouth daily as needed for erectile dysfunction.  Dispense: 30 tablet; Refill: 3  Patient have been counseled extensively about nutrition and exercise  Return in about 6 months (around 12/18/2015) for Follow up HTN.  The patient was given clear instructions to go to ER or return to medical center if symptoms don't improve, worsen or new problems develop. The patient verbalized understanding. The patient was told to call to get lab results if they haven't heard anything  in the next week.   This note has been created with Education officer, environmental. Any transcriptional errors are unintentional.    Jeanann Lewandowsky, MD, MHA, CPE, FACP, FAAP [facility] [place], [place] [phone]   06/18/2015, 9:51 AM

## 2015-06-18 NOTE — Progress Notes (Signed)
Patient here for his blood pressure check.  Patient states he feels "great" today and has no complaints of pain at this time.  Patient states he took his BP medication today at 8:00am. Patient reports concern about "performance issues" with his partner and wants to see about getting prescribed something like Viagra to help with these issues.  Patient smokes 1-2 cigarettes/day, states he is "ready to quit, that it is hard," but he is trying his best.  Patient BP 137/88, HR 65

## 2015-06-22 ENCOUNTER — Telehealth: Payer: Self-pay | Admitting: Internal Medicine

## 2015-06-22 NOTE — Telephone Encounter (Signed)
Patient called requesting change of medication for sildenafil (VIAGRA) 50 MG tablet. Patient states medication is too expensive, please f/u

## 2015-10-30 ENCOUNTER — Encounter (HOSPITAL_COMMUNITY): Payer: Self-pay | Admitting: Emergency Medicine

## 2015-10-30 ENCOUNTER — Emergency Department (HOSPITAL_COMMUNITY)
Admission: EM | Admit: 2015-10-30 | Discharge: 2015-10-31 | Disposition: A | Discharge status: Home / Self Care | Payer: Self-pay | Attending: Physician Assistant | Admitting: Physician Assistant

## 2015-10-30 ENCOUNTER — Emergency Department (HOSPITAL_COMMUNITY): Payer: Self-pay

## 2015-10-30 DIAGNOSIS — Z720 Tobacco use: Secondary | ICD-10-CM | POA: Insufficient documentation

## 2015-10-30 DIAGNOSIS — Z79899 Other long term (current) drug therapy: Secondary | ICD-10-CM | POA: Insufficient documentation

## 2015-10-30 DIAGNOSIS — R0602 Shortness of breath: Secondary | ICD-10-CM | POA: Insufficient documentation

## 2015-10-30 DIAGNOSIS — R079 Chest pain, unspecified: Secondary | ICD-10-CM | POA: Insufficient documentation

## 2015-10-30 DIAGNOSIS — I10 Essential (primary) hypertension: Secondary | ICD-10-CM | POA: Insufficient documentation

## 2015-10-30 LAB — BASIC METABOLIC PANEL
Anion gap: 16 — ABNORMAL HIGH (ref 5–15)
BUN: 9 mg/dL (ref 6–20)
CHLORIDE: 96 mmol/L — AB (ref 101–111)
CO2: 25 mmol/L (ref 22–32)
Calcium: 9.8 mg/dL (ref 8.9–10.3)
Creatinine, Ser: 1.3 mg/dL — ABNORMAL HIGH (ref 0.61–1.24)
GFR calc Af Amer: >60 mL/min (ref >60)
GFR calc non Af Amer: 58 mL/min — ABNORMAL LOW (ref >60)
GLUCOSE: 87 mg/dL (ref 65–99)
POTASSIUM: 3.1 mmol/L — AB (ref 3.5–5.1)
SODIUM: 137 mmol/L (ref 135–145)

## 2015-10-30 LAB — CBC
HEMATOCRIT: 36.4 % — AB (ref 39.0–52.0)
Hemoglobin: 12.4 g/dL — ABNORMAL LOW (ref 13.0–17.0)
MCH: 28.4 pg (ref 26.0–34.0)
MCHC: 34.1 g/dL (ref 30.0–36.0)
MCV: 83.5 fL (ref 78.0–100.0)
Platelets: 225 10*3/uL (ref 150–400)
RBC: 4.36 MIL/uL (ref 4.22–5.81)
RDW: 13.6 % (ref 11.5–15.5)
WBC: 5.9 10*3/uL (ref 4.0–10.5)

## 2015-10-30 LAB — I-STAT TROPONIN, ED: Troponin i, poc: 0 ng/mL (ref 0.00–0.08)

## 2015-10-30 MED ORDER — POTASSIUM CHLORIDE CRYS ER 20 MEQ PO TBCR
40.0000 meq | EXTENDED_RELEASE_TABLET | Freq: Once | ORAL | Status: AC
  Administered 2015-10-30: 40 meq via ORAL
  Filled 2015-10-30: qty 2

## 2015-10-30 NOTE — ED Notes (Signed)
Patient transported to X-ray 

## 2015-10-30 NOTE — ED Provider Notes (Signed)
CSN: [id number]     Arrival date & time 10/30/15  2025 History   First MD Initiated Contact with Patient 10/30/15 2128     Chief Complaint  Patient presents with  . Shortness of Breath  . Chest Pain     (Consider location/radiation/quality/duration/timing/severity/associated sxs/prior Treatment) HPI Comments: Patient with PMH of HTN presents to the ED with a chief complaint of chest pain.  Patient states that the chest pain started around 8:00 tonight.  He states that it lasted about 10 minutes.  States that it felt like a "tightness" on the right side of his chest.  He denies any associated SOB, diaphoresis, or radiating symptoms.  There are no aggravating or alleviating factors.  He received 324 of aspirin.  Denies any history of PE/DVT.  The history is provided by the patient. No language interpreter was used.    Past Medical History  Diagnosis Date  . Hypertension    History reviewed. No pertinent past surgical history. Family History  Problem Relation Age of Onset  . Hypertension Mother   . Cancer Mother   . Hypertension Father    Social History  Substance Use Topics  . Smoking status: Current Every Day Smoker -- 0.50 packs/day    Types: Cigarettes  . Smokeless tobacco: None  . Alcohol Use: No     Comment: quit drinking 7 years ago after 30 years of drinking    Review of Systems  Constitutional: Negative for fever and chills.  Respiratory: Positive for shortness of breath.   Cardiovascular: Positive for chest pain.  Gastrointestinal: Negative for nausea, vomiting, diarrhea and constipation.  Genitourinary: Negative for dysuria.  All other systems reviewed and are negative.     Allergies  Review of patient's allergies indicates no known allergies.  Home Medications   Prior to Admission medications   Medication Sig Start Date End Date Taking? Authorizing Provider  lisinopril-hydrochlorothiazide (PRINZIDE,ZESTORETIC) 20-25 MG per tablet Take 1 tablet by mouth  daily. 06/18/15  Yes Quentin Angst, MD  sildenafil (VIAGRA) 50 MG tablet Take 1 tablet (50 mg total) by mouth daily as needed for erectile dysfunction. 06/18/15   Quentin Angst, MD   BP 167/80 mmHg  Pulse 72  Temp(Src) 98.3 F (36.8 C) (Oral)  Resp 18  SpO2 100% Physical Exam  Constitutional: He is oriented to person, place, and time. He appears well-developed and well-nourished.  HENT:  Head: Normocephalic and atraumatic.  Eyes: Conjunctivae and EOM are normal. Pupils are equal, round, and reactive to light. Right eye exhibits no discharge. Left eye exhibits no discharge. No scleral icterus.  Neck: Normal range of motion. Neck supple. No JVD present.  Cardiovascular: Normal rate, regular rhythm and normal heart sounds.  Exam reveals no gallop and no friction rub.   No murmur heard. Pulmonary/Chest: Effort normal and breath sounds normal. No respiratory distress. He has no wheezes. He has no rales. He exhibits no tenderness.  Abdominal: Soft. He exhibits no distension and no mass. There is no tenderness. There is no rebound and no guarding.  Musculoskeletal: Normal range of motion. He exhibits no edema or tenderness.  Neurological: He is alert and oriented to person, place, and time.  Skin: Skin is warm and dry.  Psychiatric: He has a normal mood and affect. His behavior is normal. Judgment and thought content normal.  Nursing note and vitals reviewed.   ED Course  Procedures (including critical care time) Results for orders placed or performed during the hospital encounter of 10/30/15  Basic metabolic panel  Result Value Ref Range   Sodium 137 135 - 145 mmol/L   Potassium 3.1 (L) 3.5 - 5.1 mmol/L   Chloride 96 (L) 101 - 111 mmol/L   CO2 25 22 - 32 mmol/L   Glucose, Bld 87 65 - 99 mg/dL   BUN 9 6 - 20 mg/dL   Creatinine, Ser 4.091.30 (H) 0.61 - 1.24 mg/dL   Calcium 9.8 8.9 - 81.110.3 mg/dL   GFR calc non Af Amer 58 (L) >60 mL/min   GFR calc Af Amer >60 >60 mL/min   Anion  gap 16 (H) 5 - 15  CBC  Result Value Ref Range   WBC 5.9 4.0 - 10.5 K/uL   RBC 4.36 4.22 - 5.81 MIL/uL   Hemoglobin 12.4 (L) 13.0 - 17.0 g/dL   HCT 91.436.4 (L) 78.239.0 - 95.652.0 %   MCV 83.5 78.0 - 100.0 fL   MCH 28.4 26.0 - 34.0 pg   MCHC 34.1 30.0 - 36.0 g/dL   RDW 21.313.6 08.611.5 - 57.815.5 %   Platelets 225 150 - 400 K/uL  I-stat troponin, ED ([facility])  Result Value Ref Range   Troponin i, poc 0.00 0.00 - 0.08 ng/mL   Comment 3           Dg Chest 2 View  10/30/2015  CLINICAL DATA:  Chest pain tonight.  Smoker.  Hypertension. EXAM: CHEST  2 VIEW COMPARISON:  11/20/2009. FINDINGS: Normal sized heart. Increased density at the posterior lung bases on the lateral view with a somewhat rounded configuration. No previous lateral view for comparison. Minimal linear scarring at the medial left lung base is unchanged. Progressive hyperexpansion of the lungs. Mild diffuse peribronchial thickening. Diffuse osteopenia. IMPRESSION: 1. Possible nodule at the posterior lung bases on the lateral view. A chest CT with contrast is recommended. 2. Progressive changes of COPD and chronic bronchitis. Electronically Signed   By: Beckie SaltsSteven  Reid M.D.   On: 10/30/2015 22:07    I have personally reviewed and evaluated these images and lab results as part of my medical decision-making.   EKG Interpretation   Date/Time:  Friday October 30 2015 22:08:55 EDT Ventricular Rate:  56 PR Interval:  224 QRS Duration: 101 QT Interval:  407 QTC Calculation: 393 R Axis:   39 Text Interpretation:  Sinus rhythm Prolonged PR interval Probable  anteroseptal infarct, recent no acute ischemia No significant change since  last tracing Confirmed by Kandis MannanMACKUEN, COURTNEY ([id number]) on 10/30/2015 10:29:04  PM      MDM   Final diagnoses:  Chest pain, unspecified chest pain type    Patient with brief, fleeting episode of chest pain tonight at 8:00.  No further symptoms.  Risk factors are HTN, smoking, and age.  HEART score is 3.   Well's PE criteria score is 0.  Low risk for PE.  Doubt ACS.  Pain has completely resolved.  Patient discussed with Dr. Vinnie LevelMcKuen, who agrees with plan for delta troponin and DC to home.  K replaced in ED.  Otherwise, labs are at or near baseline.  Patient will require an outpatient CT scan for further evaluation of lung nodule.  Written and verbal instructions were given to the patient regarding this.  He will need to see his own PCP or go to community [facility] wellness.  1:03 AM Delta troponin is negative. Patient is stable and ready for discharge. Follow-up and return precautions as above.  Roxy Horsemanobert Aubryanna Nesheim, PA-C 10/31/15 0103  Courteney Lyn  Corlis Leak, MD 11/01/15 1453

## 2015-10-30 NOTE — ED Notes (Signed)
Per EMS they were called to his house w/ c/o chest pain and sob.  Pt reports that he began to experience chest pain and SOB.  The right sided chest pain only lasted 5 minutes however the SOB lasted until the EMS arrived. He was given 324 ASA inroute.  Per EMS he showed frequest 7-10 PVC per minute on the EKG.  Only hx is HNT for which he takes Lisinopril/HTZ daily at 8am.  No c/o of N/V/D and no more SOB or CP.

## 2015-10-31 LAB — I-STAT TROPONIN, ED: Troponin i, poc: 0 ng/mL (ref 0.00–0.08)

## 2015-10-31 NOTE — Discharge Instructions (Signed)
You need to schedule an appointment to have a CT scan of your chest to further evaluate the nodule on your lung.  Please make an appointment as directed above.  Nonspecific Chest Pain  Chest pain can be caused by many different conditions. There is always a chance that your pain could be related to something serious, such as a heart attack or a blood clot in your lungs. Chest pain can also be caused by conditions that are not life-threatening. If you have chest pain, it is very important to follow up with your health care provider. CAUSES  Chest pain can be caused by:  Heartburn.  Pneumonia or bronchitis.  Anxiety or stress.  Inflammation around your heart (pericarditis) or lung (pleuritis or pleurisy).  A blood clot in your lung.  A collapsed lung (pneumothorax). It can develop suddenly on its own (spontaneous pneumothorax) or from trauma to the chest.  Shingles infection (varicella-zoster virus).  Heart attack.  Damage to the bones, muscles, and cartilage that make up your chest wall. This can include:  Bruised bones due to injury.  Strained muscles or cartilage due to frequent or repeated coughing or overwork.  Fracture to one or more ribs.  Sore cartilage due to inflammation (costochondritis). RISK FACTORS  Risk factors for chest pain may include:  Activities that increase your risk for trauma or injury to your chest.  Respiratory infections or conditions that cause frequent coughing.  Medical conditions or overeating that can cause heartburn.  Heart disease or family history of heart disease.  Conditions or health behaviors that increase your risk of developing a blood clot.  Having had chicken pox (varicella zoster). SIGNS AND SYMPTOMS Chest pain can feel like:  Burning or tingling on the surface of your chest or deep in your chest.  Crushing, pressure, aching, or squeezing pain.  Dull or sharp pain that is worse when you move, cough, or take a deep  breath.  Pain that is also felt in your back, neck, shoulder, or arm, or pain that spreads to any of these areas. Your chest pain may come and go, or it may stay constant. DIAGNOSIS Lab tests or other studies may be needed to find the cause of your pain. Your health care provider may have you take a test called an ambulatory ECG (electrocardiogram). An ECG records your heartbeat patterns at the time the test is performed. You may also have other tests, such as:  Transthoracic echocardiogram (TTE). During echocardiography, sound waves are used to create a picture of all of the heart structures and to look at how blood flows through your heart.  Transesophageal echocardiogram (TEE).This is a more advanced imaging test that obtains images from inside your body. It allows your health care provider to see your heart in finer detail.  Cardiac monitoring. This allows your health care provider to monitor your heart rate and rhythm in real time.  Holter monitor. This is a portable device that records your heartbeat and can help to diagnose abnormal heartbeats. It allows your health care provider to track your heart activity for several days, if needed.  Stress tests. These can be done through exercise or by taking medicine that makes your heart beat more quickly.  Blood tests.  Imaging tests. TREATMENT  Your treatment depends on what is causing your chest pain. Treatment may include:  Medicines. These may include:  Acid blockers for heartburn.  Anti-inflammatory medicine.  Pain medicine for inflammatory conditions.  Antibiotic medicine, if an infection is present.  Medicines to dissolve blood clots.  Medicines to treat coronary artery disease.  Supportive care for conditions that do not require medicines. This may include:  Resting.  Applying heat or cold packs to injured areas.  Limiting activities until pain decreases. HOME CARE INSTRUCTIONS  If you were prescribed an  antibiotic medicine, finish it all even if you start to feel better.  Avoid any activities that bring on chest pain.  Do not use any tobacco products, including cigarettes, chewing tobacco, or electronic cigarettes. If you need help quitting, ask your health care provider.  Do not drink alcohol.  Take medicines only as directed by your health care provider.  Keep all follow-up visits as directed by your health care provider. This is important. This includes any further testing if your chest pain does not go away.  If heartburn is the cause for your chest pain, you may be told to keep your head raised (elevated) while sleeping. This reduces the chance that acid will go from your stomach into your esophagus.  Make lifestyle changes as directed by your health care provider. These may include:  Getting regular exercise. Ask your health care provider to suggest some activities that are safe for you.  Eating a heart-healthy diet. A registered dietitian can help you to learn healthy eating options.  Maintaining a healthy weight.  Managing diabetes, if necessary.  Reducing stress. SEEK MEDICAL CARE IF:  Your chest pain does not go away after treatment.  You have a rash with blisters on your chest.  You have a fever. SEEK IMMEDIATE MEDICAL CARE IF:   Your chest pain is worse.  You have an increasing cough, or you cough up blood.  You have severe abdominal pain.  You have severe weakness.  You faint.  You have chills.  You have sudden, unexplained chest discomfort.  You have sudden, unexplained discomfort in your arms, back, neck, or jaw.  You have shortness of breath at any time.  You suddenly start to sweat, or your skin gets clammy.  You feel nauseous or you vomit.  You suddenly feel light-headed or dizzy.  Your heart begins to beat quickly, or it feels like it is skipping beats. These symptoms may represent a serious problem that is an emergency. Do not wait to  see if the symptoms will go away. Get medical help right away. Call your local emergency services (911 in the [place] Do not drive yourself to the hospital.   This information is not intended to replace advice given to you by your health care provider. Make sure you discuss any questions you have with your health care provider.   Document Released: 09/21/2005 Document Revised: 01/02/2015 Document Reviewed: 07/18/2014 Elsevier Interactive Patient Education [employer].

## 2015-11-06 ENCOUNTER — Encounter: Payer: Self-pay | Admitting: Family Medicine

## 2015-11-06 ENCOUNTER — Other Ambulatory Visit: Payer: Self-pay | Admitting: Family Medicine

## 2015-11-06 ENCOUNTER — Ambulatory Visit: Payer: Self-pay | Attending: Family Medicine | Admitting: Family Medicine

## 2015-11-06 VITALS — BP 137/74 | HR 61 | Temp 98.0°F | Resp 18 | Ht 69.0 in | Wt 115.0 lb

## 2015-11-06 DIAGNOSIS — E876 Hypokalemia: Secondary | ICD-10-CM

## 2015-11-06 DIAGNOSIS — N528 Other male erectile dysfunction: Secondary | ICD-10-CM

## 2015-11-06 DIAGNOSIS — I10 Essential (primary) hypertension: Secondary | ICD-10-CM | POA: Insufficient documentation

## 2015-11-06 DIAGNOSIS — N529 Male erectile dysfunction, unspecified: Secondary | ICD-10-CM | POA: Insufficient documentation

## 2015-11-06 DIAGNOSIS — Z0000 Encounter for general adult medical examination without abnormal findings: Secondary | ICD-10-CM

## 2015-11-06 MED ORDER — SILDENAFIL CITRATE 50 MG PO TABS: 50.0000 mg | ORAL_TABLET | Freq: Every day | ORAL | Status: DC | PRN

## 2015-11-06 MED ORDER — LISINOPRIL-HYDROCHLOROTHIAZIDE 20-25 MG PO TABS: 1.0000 | ORAL_TABLET | Freq: Every day | ORAL | Status: DC

## 2015-11-06 NOTE — Progress Notes (Signed)
CC: ED follow-up  HPI: Martin Mason is a 59 y.o. male with a history of hypertension, erectile dysfunction here today for a follow up visit from the ED after he was seen for chest pain on 10/30/15. Cardiac enzymes were negative and EKG unremarkable and he was subsequently discharged.  Today he informs me he is pain free and is requesting refill of his medications. He has no complaints at this time. Patient has No headache, No chest pain, No abdominal pain - No Nausea, No new weakness tingling or numbness, No Cough - SOB.  No Known Allergies Past Medical History  Diagnosis Date  . Hypertension    Current Outpatient Prescriptions on File Prior to Visit  Medication Sig Dispense Refill  . [DISCONTINUED] lisinopril (PRINIVIL,ZESTRIL) 20 MG tablet Take 20 mg by mouth daily.     No current facility-administered medications on file prior to visit.   Family History  Problem Relation Age of Onset  . Hypertension Mother   . Cancer Mother   . Hypertension Father    Social History   Social History  . Marital Status: Single    Spouse Name: N/A  . Number of Children: N/A  . Years of Education: N/A   Occupational History  . Not on file.   Social History Main Topics  . Smoking status: Current Every Day Smoker -- 0.50 packs/day    Types: Cigarettes  . Smokeless tobacco: Not on file  . Alcohol Use: No     Comment: quit drinking 7 years ago after 30 years of drinking  . Drug Use: Yes    Special: Marijuana     Comment: 06/18/15: "last marijuana use 2 days ago"  . Sexual Activity: Not on file   Other Topics Concern  . Not on file   Social History Narrative    Review of Systems: Constitutional: Negative for fever, chills, diaphoresis, activity change, appetite change and fatigue. HENT: Negative for ear pain, nosebleeds, congestion, facial swelling, rhinorrhea, neck pain, neck stiffness and ear discharge.  Eyes: Negative for pain, discharge, redness, itching and visual  disturbance. Respiratory: Negative for cough, choking, chest tightness, shortness of breath, wheezing and stridor.  Cardiovascular: Negative for chest pain, palpitations and leg swelling. Gastrointestinal: Negative for abdominal distention. Genitourinary: Negative for dysuria, urgency, frequency, hematuria, flank pain, decreased urine volume, difficulty urinating and dyspareunia.  Musculoskeletal: Negative for back pain, joint swelling, arthralgias and gait problem. Neurological: Negative for dizziness, tremors, seizures, syncope, facial asymmetry, speech difficulty, weakness, light-headedness, numbness and headaches.  Hematological: Negative for adenopathy. Does not bruise/bleed easily. Psychiatric/Behavioral: Negative for hallucinations, behavioral problems, confusion, dysphoric mood, decreased concentration and agitation.    Objective:   Filed Vitals:   11/06/15 1433  BP: 137/74  Pulse: 61  Temp: 98 F (36.7 C)  Resp: 18    Physical Exam: Constitutional: Patient appears well-developed and well-nourished. No distress. HENT: Normocephalic, atraumatic, External right and left ear normal. Oropharynx is clear and moist.  Eyes: Conjunctivae and EOM are normal. PERRLA, no scleral icterus. Neck: Normal ROM. Neck supple. No JVD. No tracheal deviation. No thyromegaly. CVS: RRR, S1/S2 +, no murmurs, no gallops, no carotid bruit.  Pulmonary: Effort and breath sounds normal, no stridor, rhonchi, wheezes, rales.  Abdominal: Soft. BS +,  no distension, tenderness, rebound or guarding.  Musculoskeletal: Normal range of motion. No edema and no tenderness.  Lymphadenopathy: No lymphadenopathy noted, cervical, inguinal or axillary Neuro: Alert. Normal reflexes, muscle tone coordination. No cranial nerve deficit. Skin: Skin is warm and  dry. No rash noted. Not diaphoretic. No erythema. No pallor. Psychiatric: Normal mood and affect. Behavior, judgment, thought content normal.  Lab Results   Component Value Date   WBC 5.9 10/30/2015   HGB 12.4* 10/30/2015   HCT 36.4* 10/30/2015   MCV 83.5 10/30/2015   PLT 225 10/30/2015   Lab Results  Component Value Date   CREATININE 1.30* 10/30/2015   BUN 9 10/30/2015   NA 137 10/30/2015   K 3.1* 10/30/2015   CL 96* 10/30/2015   CO2 25 10/30/2015    Lab Results  Component Value Date   HGBA1C 5.5 12/16/2014   Lipid Panel     Component Value Date/Time   CHOL 171 12/16/2014 1249   TRIG 63 12/16/2014 1249   HDL 50 12/16/2014 1249   CHOLHDL 3.4 12/16/2014 1249   VLDL 13 12/16/2014 1249   LDLCALC 108* 12/16/2014 1249       Assessment and plan:  Hypertension: Controlled. Low-sodium diet, DASH diet.  Erectile dysfunction: Refilled Viagra.  Jaclyn ShaggyEnobong, Amao, MD. [facility] [phone] 11/06/2015, 3:19 PM

## 2015-11-06 NOTE — Patient Instructions (Signed)
Hypertension Hypertension, commonly called high blood pressure, is when the force of blood pumping through your arteries is too strong. Your arteries are the blood vessels that carry blood from your heart throughout your body. A blood pressure reading consists of a higher number over a lower number, such as 110/72. The higher number (systolic) is the pressure inside your arteries when your heart pumps. The lower number (diastolic) is the pressure inside your arteries when your heart relaxes. Ideally you want your blood pressure below 120/80. Hypertension forces your heart to work harder to pump blood. Your arteries may become narrow or stiff. Having untreated or uncontrolled hypertension can cause heart attack, stroke, kidney disease, and other problems. RISK FACTORS Some risk factors for high blood pressure are controllable. Others are not.  Risk factors you cannot control include:   Race. You may be at higher risk if you are African American.  Age. Risk increases with age.  Gender. Men are at higher risk than women before age 45 years. After age 65, women are at higher risk than men. Risk factors you can control include:  Not getting enough exercise or physical activity.  Being overweight.  Getting too much fat, sugar, calories, or salt in your diet.  Drinking too much alcohol. SIGNS AND SYMPTOMS Hypertension does not usually cause signs or symptoms. Extremely high blood pressure (hypertensive crisis) may cause headache, anxiety, shortness of breath, and nosebleed. DIAGNOSIS To check if you have hypertension, your health care provider will measure your blood pressure while you are seated, with your arm held at the level of your heart. It should be measured at least twice using the same arm. Certain conditions can cause a difference in blood pressure between your right and left arms. A blood pressure reading that is higher than normal on one occasion does not mean that you need treatment. If  it is not clear whether you have high blood pressure, you may be asked to return on a different day to have your blood pressure checked again. Or, you may be asked to monitor your blood pressure at home for 1 or more weeks. TREATMENT Treating high blood pressure includes making lifestyle changes and possibly taking medicine. Living a healthy lifestyle can help lower high blood pressure. You may need to change some of your habits. Lifestyle changes may include:  Following the DASH diet. This diet is high in fruits, vegetables, and whole grains. It is low in salt, red meat, and added sugars.  Keep your sodium intake below 2,300 mg per day.  Getting at least 30-45 minutes of aerobic exercise at least 4 times per week.  Losing weight if necessary.  Not smoking.  Limiting alcoholic beverages.  Learning ways to reduce stress. Your health care provider may prescribe medicine if lifestyle changes are not enough to get your blood pressure under control, and if one of the following is true:  You are 18-59 years of age and your systolic blood pressure is above 140.  You are 60 years of age or older, and your systolic blood pressure is above 150.  Your diastolic blood pressure is above 90.  You have diabetes, and your systolic blood pressure is over 140 or your diastolic blood pressure is over 90.  You have kidney disease and your blood pressure is above 140/90.  You have heart disease and your blood pressure is above 140/90. Your personal target blood pressure may vary depending on your medical conditions, your age, and other factors. HOME CARE INSTRUCTIONS    Have your blood pressure rechecked as directed by your health care provider.   Take medicines only as directed by your health care provider. Follow the directions carefully. Blood pressure medicines must be taken as prescribed. The medicine does not work as well when you skip doses. Skipping doses also puts you at risk for  problems.  Do not smoke.   Monitor your blood pressure at home as directed by your health care provider. SEEK MEDICAL CARE IF:   You think you are having a reaction to medicines taken.  You have recurrent headaches or feel dizzy.  You have swelling in your ankles.  You have trouble with your vision. SEEK IMMEDIATE MEDICAL CARE IF:  You develop a severe headache or confusion.  You have unusual weakness, numbness, or feel faint.  You have severe chest or abdominal pain.  You vomit repeatedly.  You have trouble breathing. MAKE SURE YOU:   Understand these instructions.  Will watch your condition.  Will get help right away if you are not doing well or get worse.   This information is not intended to replace advice given to you by your health care provider. Make sure you discuss any questions you have with your health care provider.   Document Released: 12/12/2005 Document Revised: 04/28/2015 Document Reviewed: 10/04/2013 Elsevier Interactive Patient Education 2016 Elsevier Inc[employer]  

## 2015-11-06 NOTE — Progress Notes (Signed)
Pt here c/o tightness of chest. Pt was seen in ED for chest pain on 10/30/15.   Pain located on right side of chest with no pain.  Pt reports taking his BP med this morning.

## 2015-11-11 ENCOUNTER — Other Ambulatory Visit: Payer: Self-pay

## 2015-11-11 ENCOUNTER — Ambulatory Visit: Payer: Self-pay | Admitting: Family Medicine

## 2015-11-11 ENCOUNTER — Telehealth: Payer: Self-pay

## 2015-11-11 DIAGNOSIS — E876 Hypokalemia: Secondary | ICD-10-CM

## 2015-11-11 NOTE — Telephone Encounter (Signed)
CMA called patient, patient did not answer. Left message for patient to call me back asap.

## 2015-11-12 NOTE — Telephone Encounter (Signed)
CMA called patient, patient didn't answer. A letter will be sent to patient address on file.

## 2016-09-03 ENCOUNTER — Other Ambulatory Visit: Payer: Self-pay | Admitting: Internal Medicine

## 2016-09-03 DIAGNOSIS — I10 Essential (primary) hypertension: Secondary | ICD-10-CM

## 2016-10-26 ENCOUNTER — Encounter: Payer: Self-pay | Admitting: Pharmacist

## 2016-10-26 ENCOUNTER — Ambulatory Visit: Payer: Self-pay | Attending: Family Medicine | Admitting: Pharmacist

## 2016-10-26 VITALS — BP 123/75 | HR 65

## 2016-10-26 DIAGNOSIS — Z23 Encounter for immunization: Secondary | ICD-10-CM

## 2016-10-26 DIAGNOSIS — I10 Essential (primary) hypertension: Secondary | ICD-10-CM | POA: Insufficient documentation

## 2016-10-26 DIAGNOSIS — Z79899 Other long term (current) drug therapy: Secondary | ICD-10-CM | POA: Insufficient documentation

## 2016-10-26 NOTE — Patient Instructions (Signed)
Thanks for coming to see us!  Schedule an appointment for a physical with Dr. Venetia NightAmao

## 2016-10-26 NOTE — Progress Notes (Signed)
   S:    Patient arrives in good spirits for a blood pressure check.  Patient reports adherence with medications.  Current BP Medications include:  Lisinopril-HCTZ 20-25 mg daily.   A O:   Last 3 Office BP readings: BP Readings from Last 3 Encounters:  10/26/16 123/75  11/06/15 137/74  10/31/15 116/65    BMET    Component Value Date/Time   NA 137 10/30/2015 2131   K 3.1 (L) 10/30/2015 2131   CL 96 (L) 10/30/2015 2131   CO2 25 10/30/2015 2131   GLUCOSE 87 10/30/2015 2131   BUN 9 10/30/2015 2131   CREATININE 1.30 (H) 10/30/2015 2131   CREATININE 1.25 12/16/2014 1249   CALCIUM 9.8 10/30/2015 2131   GFRNONAA 58 (L) 10/30/2015 2131   GFRNONAA 62 12/16/2014 1249   GFRAA >60 10/30/2015 2131   GFRAA 72 12/16/2014 1249    A/P: Hypertension longstanding currently controlled on current medications. Continue medications as prescribed. Patient to make appointment with Dr. Venetia NightAmao for physical. Flu vaccine given today.   Results reviewed and written information provided.   Total time in face-to-face counseling 10 minutes.   F/U Clinic Visit with Dr. Venetia NightAmao.  Patient seen with Donnajean LopesSonya Anderson, PharmD Candidate

## 2016-11-01 ENCOUNTER — Telehealth: Payer: Self-pay | Admitting: Family Medicine

## 2016-11-01 DIAGNOSIS — I10 Essential (primary) hypertension: Secondary | ICD-10-CM

## 2016-11-01 MED ORDER — LISINOPRIL-HYDROCHLOROTHIAZIDE 20-25 MG PO TABS: 1.0000 | ORAL_TABLET | Freq: Every day | ORAL | 0 refills | Status: DC

## 2016-11-01 NOTE — Telephone Encounter (Signed)
Lisinopril-HCTZ x 90 days ordered

## 2016-11-01 NOTE — Telephone Encounter (Signed)
Patient needs a 90 day supply of lisinopril. Please follow up.

## 2017-03-01 ENCOUNTER — Encounter: Payer: Self-pay | Admitting: Family Medicine

## 2017-03-01 ENCOUNTER — Ambulatory Visit: Payer: Self-pay | Attending: Family Medicine | Admitting: Family Medicine

## 2017-03-01 DIAGNOSIS — I10 Essential (primary) hypertension: Secondary | ICD-10-CM | POA: Insufficient documentation

## 2017-03-01 DIAGNOSIS — F17210 Nicotine dependence, cigarettes, uncomplicated: Secondary | ICD-10-CM | POA: Insufficient documentation

## 2017-03-01 DIAGNOSIS — Z720 Tobacco use: Secondary | ICD-10-CM | POA: Insufficient documentation

## 2017-03-01 LAB — COMPLETE METABOLIC PANEL WITH GFR
ALBUMIN: 4.3 g/dL (ref 3.6–5.1)
ALK PHOS: 56 U/L (ref 40–115)
ALT: 8 U/L — AB (ref 9–46)
AST: 17 U/L (ref 10–35)
BUN: 14 mg/dL (ref 7–25)
CALCIUM: 9.8 mg/dL (ref 8.6–10.3)
CO2: 27 mmol/L (ref 20–31)
Chloride: 101 mmol/L (ref 98–110)
Creat: 1.24 mg/dL (ref 0.70–1.25)
GFR, EST AFRICAN AMERICAN: 72 mL/min (ref >=60)
GFR, Est Non African American: 62 mL/min (ref >=60)
Glucose, Bld: 81 mg/dL (ref 65–99)
Potassium: 3.9 mmol/L (ref 3.5–5.3)
Sodium: 139 mmol/L (ref 135–146)
Total Bilirubin: 0.6 mg/dL (ref 0.2–1.2)
Total Protein: 7.4 g/dL (ref 6.1–8.1)

## 2017-03-01 MED ORDER — LISINOPRIL-HYDROCHLOROTHIAZIDE 20-25 MG PO TABS: 1.0000 | ORAL_TABLET | Freq: Every day | ORAL | 1 refills | Status: DC

## 2017-03-01 NOTE — Progress Notes (Signed)
Need refill on BP med

## 2017-03-01 NOTE — Progress Notes (Signed)
Subjective:  Patient ID: Martin Mason, male    DOB: 09-27-1956  Age: 61 y.o. MRN: [id number]  CC: Hypertension   HPI Nicholes CalamityJerry Leng East 61 year old with a history of hypertension, tobacco abuse who presents today for follow-up visit. He has been compliant with his medications and low-sodium diet but does not exercise regularly. Request refills of his antihypertensives.  He currently smokes 3 cigarettes per day and is not ready to quit. Denies chest pains, shortness of breath.  Past Medical History:  Diagnosis Date  . Hypertension     History reviewed. No pertinent surgical history.  No Known Allergies    Outpatient Medications Prior to Visit  Medication Sig Dispense Refill  . lisinopril-hydrochlorothiazide (PRINZIDE,ZESTORETIC) 20-25 MG tablet Take 1 tablet by mouth daily. 90 tablet 0  . sildenafil (VIAGRA) 50 MG tablet Take 1 tablet (50 mg total) by mouth daily as needed for erectile dysfunction. (Patient not taking: Reported on 03/01/2017) 10 tablet 0   No facility-administered medications prior to visit.     ROS Review of Systems  Constitutional: Negative for activity change and appetite change.  HENT: Negative for sinus pressure and sore throat.   Eyes: Negative for visual disturbance.  Respiratory: Negative for cough, chest tightness and shortness of breath.   Cardiovascular: Negative for chest pain and leg swelling.  Gastrointestinal: Negative for abdominal distention, abdominal pain, constipation and diarrhea.  Endocrine: Negative.   Genitourinary: Negative for dysuria.  Musculoskeletal: Negative for joint swelling and myalgias.  Skin: Negative for rash.  Allergic/Immunologic: Negative.   Neurological: Negative for weakness, light-headedness and numbness.  Psychiatric/Behavioral: Negative for dysphoric mood and suicidal ideas.    Objective:  BP 122/69 (BP Location: Right Arm, Patient Position: Sitting, Cuff Size: Small)   Pulse (!) 52   Temp 97.3 F (36.3 C)  (Oral)   Ht 5\' 8"  (1.727 m)   Wt 122 lb (55.3 kg)   SpO2 100%   BMI 18.55 kg/m   BP/Weight 03/01/2017 10/26/2016 11/06/2015  Systolic BP 122 123 137  Diastolic BP 69 75 74  Wt. (Lbs) 122 - 115  BMI 18.55 - 16.97      Physical Exam  Constitutional: He is oriented to person, place, and time. He appears well-developed and well-nourished.  Cardiovascular: Normal rate, normal heart sounds and intact distal pulses.   No murmur heard. Pulmonary/Chest: Effort normal and breath sounds normal. He has no wheezes. He has no rales. He exhibits no tenderness.  Abdominal: Soft. Bowel sounds are normal. He exhibits no distension and no mass. There is no tenderness.  Musculoskeletal: Normal range of motion.  Neurological: He is alert and oriented to person, place, and time.  Skin: Skin is warm.  Psychiatric: He has a normal mood and affect.    CMP Latest Ref Rng & Units 10/30/2015 12/16/2014 11/28/2013  Glucose 65 - 99 mg/dL 87 74 95  BUN 6 - 20 mg/dL 9 13 13   Creatinine 0.61 - 1.24 mg/dL 4.09(W1.30(H) 1.191.25 1.471.11  Sodium 135 - 145 mmol/L 137 138 136  Potassium 3.5 - 5.1 mmol/L 3.1(L) 4.4 4.1  Chloride 101 - 111 mmol/L 96(L) 100 98  CO2 22 - 32 mmol/L 25 29 29   Calcium 8.9 - 10.3 mg/dL 9.8 9.8 82.910.3  Total Protein 6.0 - 8.3 g/dL - 7.4 7.9  Total Bilirubin 0.2 - 1.2 mg/dL - 0.5 0.3  Alkaline Phos 39 - 117 U/L - 59 68  AST 0 - 37 U/L - 19 19  ALT 0 - 53 U/L -  9 10    Lipid Panel     Component Value Date/Time   CHOL 171 12/16/2014 1249   TRIG 63 12/16/2014 1249   HDL 50 12/16/2014 1249   CHOLHDL 3.4 12/16/2014 1249   VLDL 13 12/16/2014 1249   LDLCALC 108 (H) 12/16/2014 1249       Assessment & Plan:   1. Essential hypertension, benign Controlled Low-sodium diet Lipid panel at next visit - lisinopril-hydrochlorothiazide (PRINZIDE,ZESTORETIC) 20-25 MG tablet; Take 1 tablet by mouth daily.  Dispense: 90 tablet; Refill: 1 - COMPLETE METABOLIC PANEL WITH GFR  2. Tobacco abuse Spent 3  minutes counseling on cessation and he is not ready to quit at this time.   Meds ordered this encounter  Medications  . lisinopril-hydrochlorothiazide (PRINZIDE,ZESTORETIC) 20-25 MG tablet    Sig: Take 1 tablet by mouth daily.    Dispense:  90 tablet    Refill:  1    Follow-up: Return in about 6 months (around 09/01/2017) for Follow-up on hypertension.   Jaclyn Shaggy MD

## 2017-03-01 NOTE — Patient Instructions (Signed)
Steps to Quit Smoking Smoking tobacco can be bad for your health. It can also affect almost every organ in your body. Smoking puts you and people around you at risk for many serious long-lasting (chronic) diseases. Quitting smoking is hard, but it is one of the best things that you can do for your health. It is never too late to quit. What are the benefits of quitting smoking? When you quit smoking, you lower your risk for getting serious diseases and conditions. They can include:  Lung cancer or lung disease.  Heart disease.  Stroke.  Heart attack.  Not being able to have children (infertility).  Weak bones (osteoporosis) and broken bones (fractures). If you have coughing, wheezing, and shortness of breath, those symptoms may get better when you quit. You may also get sick less often. If you are pregnant, quitting smoking can help to lower your chances of having a baby of low birth weight. What can I do to help me quit smoking? Talk with your doctor about what can help you quit smoking. Some things you can do (strategies) include:  Quitting smoking totally, instead of slowly cutting back how much you smoke over a period of time.  Going to in-person counseling. You are more likely to quit if you go to many counseling sessions.  Using resources and support systems, such as:  Online chats with a counselor.  Phone quitlines.  Printed self-help materials.  Support groups or group counseling.  Text messaging programs.  Mobile phone apps or applications.  Taking medicines. Some of these medicines may have nicotine in them. If you are pregnant or breastfeeding, do not take any medicines to quit smoking unless your doctor says it is okay. Talk with your doctor about counseling or other things that can help you. Talk with your doctor about using more than one strategy at the same time, such as taking medicines while you are also going to in-person counseling. This can help make quitting  easier. What things can I do to make it easier to quit? Quitting smoking might feel very hard at first, but there is a lot that you can do to make it easier. Take these steps:  Talk to your family and friends. Ask them to support and encourage you.  Call phone quitlines, reach out to support groups, or work with a counselor.  Ask people who smoke to not smoke around you.  Avoid places that make you want (trigger) to smoke, such as:  Bars.  Parties.  Smoke-break areas at work.  Spend time with people who do not smoke.  Lower the stress in your life. Stress can make you want to smoke. Try these things to help your stress:  Getting regular exercise.  Deep-breathing exercises.  Yoga.  Meditating.  Doing a body scan. To do this, close your eyes, focus on one area of your body at a time from head to toe, and notice which parts of your body are tense. Try to relax the muscles in those areas.  Download or buy apps on your mobile phone or tablet that can help you stick to your quit plan. There are many free apps, such as QuitGuide from the CDC (Centers for Disease Control and Prevention). You can find more support from smokefree.gov and other websites. This information is not intended to replace advice given to you by your health care provider. Make sure you discuss any questions you have with your health care provider. Document Released: 10/08/2009 Document Revised: 08/09/2016 Document   Reviewed: 04/28/2015 Elsevier Interactive Patient Education  2017 Elsevier Inc.  

## 2017-03-03 ENCOUNTER — Telehealth: Payer: Self-pay

## 2017-03-03 NOTE — Telephone Encounter (Signed)
Writer called patient with lab results. Patient stated understanding. 

## 2017-03-03 NOTE — Telephone Encounter (Signed)
-----   Message from Enobong Amao, MD sent at 03/02/2017 12:53 PM EST ----- Please inform the patient that labs are normal. Thank you. 

## 2017-05-27 ENCOUNTER — Other Ambulatory Visit: Payer: Self-pay | Admitting: Family Medicine

## 2017-05-27 DIAGNOSIS — I10 Essential (primary) hypertension: Secondary | ICD-10-CM

## 2017-08-30 ENCOUNTER — Ambulatory Visit: Payer: Self-pay | Attending: Family Medicine | Admitting: Family Medicine

## 2017-08-30 ENCOUNTER — Encounter: Payer: Self-pay | Admitting: Family Medicine

## 2017-08-30 VITALS — BP 120/80 | HR 66 | Temp 98.1°F | Ht 68.0 in | Wt 120.6 lb

## 2017-08-30 DIAGNOSIS — Z1211 Encounter for screening for malignant neoplasm of colon: Secondary | ICD-10-CM

## 2017-08-30 DIAGNOSIS — Z1159 Encounter for screening for other viral diseases: Secondary | ICD-10-CM

## 2017-08-30 DIAGNOSIS — I10 Essential (primary) hypertension: Secondary | ICD-10-CM

## 2017-08-30 DIAGNOSIS — Z716 Tobacco abuse counseling: Secondary | ICD-10-CM | POA: Insufficient documentation

## 2017-08-30 DIAGNOSIS — Z720 Tobacco use: Secondary | ICD-10-CM

## 2017-08-30 DIAGNOSIS — F17210 Nicotine dependence, cigarettes, uncomplicated: Secondary | ICD-10-CM | POA: Insufficient documentation

## 2017-08-30 MED ORDER — LISINOPRIL-HYDROCHLOROTHIAZIDE 20-25 MG PO TABS: 1.0000 | ORAL_TABLET | Freq: Every day | ORAL | 1 refills | Status: DC

## 2017-08-30 NOTE — Progress Notes (Signed)
Subjective:  Patient ID: Martin Mason, male    DOB: 10/16/1956  Age: 61 y.o. MRN: [id number]  CC: Hypertension   HPI Martin Mason is a 61 year old with a history of hypertension, tobacco abuse who presents today for follow-up visit. He has been compliant with his medications and low-sodium diet but does not exercise regularly. Denies chest pains, shortness of breath or pedal edema.  He smokes about 3-4 cigarettes per day and he is not ready to quit Requests refills of all his medications. He has no acute concerns today.  Past Medical History:  Diagnosis Date  . Hypertension     No past surgical history on file.  No Known Allergies   Outpatient Medications Prior to Visit  Medication Sig Dispense Refill  . lisinopril-hydrochlorothiazide (PRINZIDE,ZESTORETIC) 20-25 MG tablet Take 1 tablet by mouth daily. 90 tablet 1  . sildenafil (VIAGRA) 50 MG tablet Take 1 tablet (50 mg total) by mouth daily as needed for erectile dysfunction. (Patient not taking: Reported on 03/01/2017) 10 tablet 0   No facility-administered medications prior to visit.     ROS Review of Systems  Constitutional: Negative for activity change and appetite change.  HENT: Negative for sinus pressure and sore throat.   Eyes: Negative for visual disturbance.  Respiratory: Negative for cough, chest tightness and shortness of breath.   Cardiovascular: Negative for chest pain and leg swelling.  Gastrointestinal: Negative for abdominal distention, abdominal pain, constipation and diarrhea.  Endocrine: Negative.   Genitourinary: Negative for dysuria.  Musculoskeletal: Negative for joint swelling and myalgias.  Skin: Negative for rash.  Allergic/Immunologic: Negative.   Neurological: Negative for weakness, light-headedness and numbness.  Psychiatric/Behavioral: Negative for dysphoric mood and suicidal ideas.    Objective:  BP 120/80   Pulse 66   Temp 98.1 F (36.7 C) (Oral)   Ht 5' 8" (1.727 m)   Wt 120 lb  9.6 oz (54.7 kg)   SpO2 100%   BMI 18.34 kg/m   BP/Weight 08/30/2017 03/01/2017 24/03/101  Systolic BP 725 366 440  Diastolic BP 80 69 75  Wt. (Lbs) 120.6 122 -  BMI 18.34 18.55 -      Physical Exam  Constitutional: He is oriented to person, place, and time. He appears well-developed and well-nourished.  Cardiovascular: Normal rate, normal heart sounds and intact distal pulses.   No murmur heard. Pulmonary/Chest: Effort normal and breath sounds normal. He has no wheezes. He has no rales. He exhibits no tenderness.  Abdominal: Soft. Bowel sounds are normal. He exhibits no distension and no mass. There is no tenderness.  Musculoskeletal: Normal range of motion.  Neurological: He is alert and oriented to person, place, and time.  Skin: Skin is warm and dry.  Psychiatric: He has a normal mood and affect.     Assessment & Plan:   1. Essential hypertension Controlled Low-sodium diet - lisinopril-hydrochlorothiazide (PRINZIDE,ZESTORETIC) 20-25 MG tablet; Take 1 tablet by mouth daily.  Dispense: 90 tablet; Refill: 1 - Lipid panel; Future - CMP14+EGFR; Future  2. Tobacco abuse Spent 3 minutes counseling on cessation and he is not ready to quit  3. Need for hepatitis C screening test - Hepatitis c antibody (reflex); Future  4. Screening for colon cancer - Ambulatory referral to Gastroenterology - Fecal occult blood, imunochemical    Meds ordered this encounter  Medications  . lisinopril-hydrochlorothiazide (PRINZIDE,ZESTORETIC) 20-25 MG tablet    Sig: Take 1 tablet by mouth daily.    Dispense:  90 tablet    Refill:  1    Follow-up: Six-month follow-up of hypertension   Arnoldo Morale MD

## 2018-02-21 ENCOUNTER — Ambulatory Visit: Payer: Self-pay | Attending: Family Medicine | Admitting: Family Medicine

## 2018-02-21 ENCOUNTER — Encounter: Payer: Self-pay | Admitting: Family Medicine

## 2018-02-21 VITALS — BP 165/89 | HR 66 | Temp 97.4°F | Ht 68.0 in | Wt 123.4 lb

## 2018-02-21 DIAGNOSIS — Z720 Tobacco use: Secondary | ICD-10-CM

## 2018-02-21 DIAGNOSIS — Z1159 Encounter for screening for other viral diseases: Secondary | ICD-10-CM

## 2018-02-21 DIAGNOSIS — F17210 Nicotine dependence, cigarettes, uncomplicated: Secondary | ICD-10-CM | POA: Insufficient documentation

## 2018-02-21 DIAGNOSIS — R430 Anosmia: Secondary | ICD-10-CM | POA: Insufficient documentation

## 2018-02-21 DIAGNOSIS — I10 Essential (primary) hypertension: Secondary | ICD-10-CM | POA: Insufficient documentation

## 2018-02-21 MED ORDER — LISINOPRIL-HYDROCHLOROTHIAZIDE 20-25 MG PO TABS: 1.0000 | ORAL_TABLET | Freq: Every day | ORAL | 1 refills | Status: DC

## 2018-02-22 ENCOUNTER — Encounter: Payer: Self-pay | Admitting: Family Medicine

## 2018-02-22 MED ORDER — CETIRIZINE HCL 10 MG PO TABS: 10.0000 mg | ORAL_TABLET | Freq: Every day | ORAL | 1 refills | Status: DC

## 2018-02-22 NOTE — Progress Notes (Signed)
 Subjective:  Patient ID: Martin Mason, male    DOB: 02/17/1956  Age: 62 y.o. MRN: [id number]  CC: Hypertension and Medication Refill   HPI Martin Mason is a 62-year-old with a history of hypertension, tobacco abuse who presents today for follow-up visit. He has been compliant with his medications and a low-sodium diet but does not exercise regularly.  His blood pressure is elevated today and he endorses smoking just before this visit. Denies chest pains, shortness of breath or pedal edema.  He complains of reduced sense of smell which she noticed while he was cooking chicken and could only proceed if this chicken when he reported close to his nose.  He is unsure of the duration of symptoms.  He has not noticed this with other foods or substances.  Denies gait imbalance, nausea, vomiting, blurry vision or headaches. He endorses postnasal drip and has to clear his throat often.  He continues to smoke and has smoked for 30 years; tells me he has cut back to 6 cigarettes/day.   Past Medical History:  Diagnosis Date  . Hypertension     No past surgical history on file.  No Known Allergies   Outpatient Medications Prior to Visit  Medication Sig Dispense Refill  . lisinopril-hydrochlorothiazide (PRINZIDE,ZESTORETIC) 20-25 MG tablet Take 1 tablet by mouth daily. 90 tablet 1   No facility-administered medications prior to visit.     ROS Review of Systems  Constitutional: Negative for activity change and appetite change.  HENT: Negative for sinus pressure and sore throat.   Eyes: Negative for visual disturbance.  Respiratory: Negative for cough, chest tightness and shortness of breath.   Cardiovascular: Negative for chest pain and leg swelling.  Gastrointestinal: Negative for abdominal distention, abdominal pain, constipation and diarrhea.  Endocrine: Negative.   Genitourinary: Negative for dysuria.  Musculoskeletal: Negative for joint swelling and myalgias.  Skin: Negative for  rash.  Allergic/Immunologic: Negative.   Neurological: Negative for weakness, light-headedness and numbness.  Psychiatric/Behavioral: Negative for dysphoric mood and suicidal ideas.    Objective:  BP (!) 165/89   Pulse 66   Temp (!) 97.4 F (36.3 C) (Oral)   Ht 5' 8" (1.727 m)   Wt 123 lb 6.4 oz (56 kg)   SpO2 100%   BMI 18.76 kg/m   BP/Weight 02/21/2018 08/30/2017 03/01/2017  Systolic BP 165 120 122  Diastolic BP 89 80 69  Wt. (Lbs) 123.4 120.6 122  BMI 18.76 18.34 18.55      Physical Exam  Constitutional: He is oriented to person, place, and time. He appears well-developed and well-nourished.  HENT:  Right Ear: External ear normal.  Left Ear: External ear normal.  Nose: Nose normal.  Mouth/Throat: Oropharynx is clear and moist.  Cardiovascular: Normal rate, normal heart sounds and intact distal pulses.  No murmur heard. Pulmonary/Chest: Effort normal and breath sounds normal. He has no wheezes. He has no rales. He exhibits no tenderness.  Abdominal: Soft. Bowel sounds are normal. He exhibits no distension and no mass. There is no tenderness.  Musculoskeletal: Normal range of motion.  Neurological: He is alert and oriented to person, place, and time.     Assessment & Plan:   1. Essential hypertension Uncontrolled He recently smoked just before coming into the clinic No regimen change today Counseled on blood pressure goal of less than 130/80, low-sodium, DASH diet, medication compliance, 150 minutes of moderate intensity exercise per week. Discussed medication compliance, adverse effects. - lisinopril-hydrochlorothiazide (PRINZIDE,ZESTORETIC) 20-25 MG tablet; Take 1   tablet by mouth daily.  Dispense: 90 tablet; Refill: 1 - CMP14+EGFR; Future - Lipid panel; Future - Hepatitis c antibody (reflex); Future  2. Need for hepatitis C screening test Hepatitis C ab ordered  3. Anosmia Nose exam is normal, no neuro deficits noted Could be sinus related I have placed him  on Zyrtec and will reassess  4. Tobacco abuse Smoking cessation support: smoking cessation hotline: 1-800-QUIT-NOW.  Smoking cessation classes are available through [facility]. Call 336-832-9999 or visit our website at [email or web address].  Spent 3 minutes counseling on dangers of tobacco use and benefits of quitting and patient is not ready to quit.    Meds ordered this encounter  Medications  . lisinopril-hydrochlorothiazide (PRINZIDE,ZESTORETIC) 20-25 MG tablet    Sig: Take 1 tablet by mouth daily.    Dispense:  90 tablet    Refill:  1    Follow-up: Return in about 3 months (around 05/21/2018).   Enobong Newlin MD   

## 2018-02-23 ENCOUNTER — Ambulatory Visit: Payer: Self-pay | Attending: Family Medicine

## 2018-02-23 DIAGNOSIS — I10 Essential (primary) hypertension: Secondary | ICD-10-CM | POA: Insufficient documentation

## 2018-02-23 NOTE — Progress Notes (Signed)
Patient here for lab visit  

## 2018-02-24 LAB — CMP14+EGFR
ALT: 11 IU/L (ref 0–44)
AST: 20 IU/L (ref 0–40)
Albumin/Globulin Ratio: 1.6 (ref 1.2–2.2)
Albumin: 4.5 g/dL (ref 3.6–4.8)
Alkaline Phosphatase: 58 IU/L (ref 39–117)
BILIRUBIN TOTAL: 0.6 mg/dL (ref 0.0–1.2)
BUN / CREAT RATIO: 12 (ref 10–24)
BUN: 14 mg/dL (ref 8–27)
CALCIUM: 9.9 mg/dL (ref 8.6–10.2)
CHLORIDE: 100 mmol/L (ref 96–106)
CO2: 24 mmol/L (ref 20–29)
CREATININE: 1.19 mg/dL (ref 0.76–1.27)
GFR calc non Af Amer: 65 mL/min/{1.73_m2} (ref >59)
GFR, EST AFRICAN AMERICAN: 75 mL/min/{1.73_m2} (ref >59)
GLUCOSE: 91 mg/dL (ref 65–99)
Globulin, Total: 2.9 g/dL (ref 1.5–4.5)
Potassium: 4.2 mmol/L (ref 3.5–5.2)
Sodium: 140 mmol/L (ref 134–144)
TOTAL PROTEIN: 7.4 g/dL (ref 6.0–8.5)

## 2018-02-24 LAB — LIPID PANEL
Chol/HDL Ratio: 2.9 ratio (ref 0.0–5.0)
Cholesterol, Total: 176 mg/dL (ref 100–199)
HDL: 60 mg/dL (ref >39)
LDL Calculated: 104 mg/dL — ABNORMAL HIGH (ref 0–99)
Triglycerides: 61 mg/dL (ref 0–149)
VLDL Cholesterol Cal: 12 mg/dL (ref 5–40)

## 2018-02-24 LAB — HEPATITIS C ANTIBODY (REFLEX)

## 2018-02-24 LAB — HCV COMMENT:

## 2018-02-26 ENCOUNTER — Telehealth: Payer: Self-pay

## 2018-02-26 NOTE — Telephone Encounter (Signed)
Patient was called and informed of lab results. 

## 2018-03-21 ENCOUNTER — Ambulatory Visit: Payer: Self-pay | Attending: Family Medicine

## 2018-05-18 ENCOUNTER — Ambulatory Visit: Payer: Self-pay | Attending: Family Medicine

## 2018-05-22 ENCOUNTER — Ambulatory Visit: Payer: Self-pay | Admitting: Family Medicine

## 2018-05-30 ENCOUNTER — Ambulatory Visit: Payer: Self-pay | Attending: Family Medicine

## 2018-08-25 ENCOUNTER — Other Ambulatory Visit: Payer: Self-pay | Admitting: Family Medicine

## 2018-08-25 DIAGNOSIS — I10 Essential (primary) hypertension: Secondary | ICD-10-CM

## 2018-09-19 ENCOUNTER — Telehealth: Payer: Self-pay | Admitting: Family Medicine

## 2018-09-19 NOTE — Telephone Encounter (Signed)
1) Medication(s) Requested (by name): lisinopril-hydrochlorothiazide (PRINZIDE,ZESTORETIC) 20-25 MG tablet [[id number]]   2) Pharmacy of Choice: [facility] [place], [place] - [address] 3) Special Requests: Patient scheduled appt for first avaliable, would only like enough till then. Approved medications will be sent to the pharmacy, we will reach out if there is an issue.  Requests made after 3pm may not be addressed until the following business day!  If a patient is unsure of the name of the medication(s) please note and ask patient to call back when they are able to provide all info, do not send to responsible party until all information is available!

## 2018-09-20 ENCOUNTER — Encounter: Payer: Self-pay | Admitting: Family Medicine

## 2018-09-20 ENCOUNTER — Ambulatory Visit: Payer: Self-pay | Attending: Family Medicine | Admitting: Family Medicine

## 2018-09-20 DIAGNOSIS — I10 Essential (primary) hypertension: Secondary | ICD-10-CM | POA: Insufficient documentation

## 2018-09-20 DIAGNOSIS — F17210 Nicotine dependence, cigarettes, uncomplicated: Secondary | ICD-10-CM | POA: Insufficient documentation

## 2018-09-20 DIAGNOSIS — F10231 Alcohol dependence with withdrawal delirium: Secondary | ICD-10-CM | POA: Insufficient documentation

## 2018-09-20 DIAGNOSIS — Z23 Encounter for immunization: Secondary | ICD-10-CM | POA: Insufficient documentation

## 2018-09-20 MED ORDER — LISINOPRIL-HYDROCHLOROTHIAZIDE 20-25 MG PO TABS: 1.0000 | ORAL_TABLET | Freq: Every day | ORAL | 6 refills | Status: DC

## 2018-09-20 NOTE — Telephone Encounter (Signed)
Patient was given an RX on 08/28/18 for 30 days. His appt is today 09/20/18, further refills will be discussed at the appt.

## 2018-09-20 NOTE — Patient Instructions (Addendum)
Goal blood pressure is less than 140/90.  Call the office if your blood pressure remains higher than 140/90.  How to Take Your Blood Pressure You can take your blood pressure at home with a machine. You may need to check your blood pressure at home:  To check if you have high blood pressure (hypertension).  To check your blood pressure over time.  To make sure your blood pressure medicine is working.  Supplies needed: You will need a blood pressure machine, or monitor. You can buy one at a drugstore or online. When choosing one:  Choose one with an arm cuff.  Choose one that wraps around your upper arm. Only one finger should fit between your arm and the cuff.  Do not choose one that measures your blood pressure from your wrist or finger.  Your doctor can suggest a monitor. How to prepare Avoid these things for 30 minutes before checking your blood pressure:  Drinking caffeine.  Drinking alcohol.  Eating.  Smoking.  Exercising.  Five minutes before checking your blood pressure:  Pee.  Sit in a dining chair. Avoid sitting in a soft couch or armchair.  Be quiet. Do not talk.  How to take your blood pressure Follow the instructions that came with your machine. If you have a digital blood pressure monitor, these may be the instructions: 1. Sit up straight. 2. Place your feet on the floor. Do not cross your ankles or legs. 3. Rest your left arm at the level of your heart. You may rest it on a table, desk, or chair. 4. Pull up your shirt sleeve. 5. Wrap the blood pressure cuff around the upper part of your left arm. The cuff should be 1 inch (2.5 cm) above your elbow. It is best to wrap the cuff around bare skin. 6. Fit the cuff snugly around your arm. You should be able to place only one finger between the cuff and your arm. 7. Put the cord inside the groove of your elbow. 8. Press the power button. 9. Sit quietly while the cuff fills with air and loses  air. 10. Write down the numbers on the screen. 11. Wait 2-3 minutes and then repeat steps 1-10.  What do the numbers mean? Two numbers make up your blood pressure. The first number is called systolic pressure. The second is called diastolic pressure. An example of a blood pressure reading is "120 over 80" (or 120/80). If you are an adult and do not have a medical condition, use this guide to find out if your blood pressure is normal: Normal  First number: below 120.  Second number: below 80. Elevated  First number: 120-129.  Second number: below 80. Hypertension stage 1  First number: 130-139.  Second number: 80-89. Hypertension stage 2  First number: 140 or above.  Second number: 90 or above. Your blood pressure is above normal even if only the top or bottom number is above normal. Follow these instructions at home:  Check your blood pressure as often as your doctor tells you to.  Take your monitor to your next doctor's appointment. Your doctor will: ? Make sure you are using it correctly. ? Make sure it is working right.  Make sure you understand what your blood pressure numbers should be.  Tell your doctor if your medicines are causing side effects. Contact a doctor if:  Your blood pressure keeps being high. Get help right away if:  Your first blood pressure number is higher than  180.  Your second blood pressure number is higher than 120. This information is not intended to replace advice given to you by your health care provider. Make sure you discuss any questions you have with your health care provider. Document Released: 11/24/2008 Document Revised: 11/09/2016 Document Reviewed: 05/20/2016 Elsevier Interactive Patient Education  [employer].

## 2018-09-20 NOTE — Progress Notes (Signed)
Martin Mason, is a 62 y.o. male  NWG:[id number]  [id number]:[id number]  DOB - 09-Jan-1956  CC:  Chief Complaint  Patient presents with  . Hypertension    doesn't check BP at home(just got a BP cuff at home. plans to start checking). walks & watches salt intake. denies chest pain, SHOB, lower extremity swelling, headaches, vision changes       HPI: Martin Mason is a 62 y.o. male is here today to establish care. Previous medical provider or no recent medical provider. Hypertension:  She reports no routine home monitoring of blood pressure.  However he has a home pressure monitoring device and will have a family member show him how to measure his blood pressure.  He continues to be a daily smoker however has cut back to number cigarettes smoked per day from 1/2 pack/day to approximately 3 cigarettes/day.  He has minimized the amount of sodium he intakes and does not use any type of salt and tries to avoid fried foods.  He is free of alcohol use and reports he has not had an alcoholic beverage in over 11 years.  He denies any chest pain, shortness of breath, lower extremity edema, dizziness, or new headaches.    Chronic health problems include:has ALCOHOLISM; Essential hypertension; ELECTROCARDIOGRAM, ABNORMAL; DELIRIUM TREMENS; HYPERTROPHY PROSTATE W/O UR OBST & OTH LUTS; FLANK PAIN, RIGHT; NEOPLASM, MALIGNANT, COLON, HX OF; Essential hypertension, benign; Other male erectile dysfunction; and Tobacco abuse on their problem list.   Current medications: Current Outpatient Medications:  .  lisinopril-hydrochlorothiazide (PRINZIDE,ZESTORETIC) 20-25 MG tablet, Take 1 tablet by mouth daily., Disp: 30 tablet, Rfl: 6   Pertinent family medical history: family history includes Cancer in his mother; Hypertension in his father and mother.    No Known Allergies  Social History   Socioeconomic History  . Marital status: Single    Spouse name: Not on file  . Number of children: Not on file  . Years of  education: Not on file  . Highest education level: Not on file  Occupational History  . Not on file  Social Needs  . Financial resource strain: Not on file  . Food insecurity:    Worry: Not on file    Inability: Not on file  . Transportation needs:    Medical: Not on file    Non-medical: Not on file  Tobacco Use  . Smoking status: Current Every Day Smoker    Packs/day: 0.25    Types: Cigarettes  . Smokeless tobacco: Never Used  . Tobacco comment: 4 daily  Substance and Sexual Activity  . Alcohol use: No    Comment: quit drinking 7 years ago after 30 years of drinking  . Drug use: Yes    Types: Marijuana    Comment: last smoked 4 days ago  . Sexual activity: Not on file  Lifestyle  . Physical activity:    Days per week: Not on file    Minutes per session: Not on file  . Stress: Not on file  Relationships  . Social connections:    Talks on phone: Not on file    Gets together: Not on file    Attends religious service: Not on file    Active member of club or organization: Not on file    Attends meetings of clubs or organizations: Not on file    Relationship status: Not on file  . Intimate partner violence:    Fear of current or ex partner: Not on file    Emotionally  abused: Not on file    Physically abused: Not on file    Forced sexual activity: Not on file  Other Topics Concern  . Not on file  Social History Narrative  . Not on file    Review of Systems: Pertinent negatives listed in HPI Objective:   Vitals:   09/20/18 1600 09/20/18 1629  BP: (!) 156/87 (!) 142/78  Pulse: 65   Resp: 17   Temp: 98.5 F (36.9 C)   SpO2: 97%     Physical Exam: Constitutional: Patient appears well-developed and well-nourished. No distress. HENT: Normocephalic, atraumatic, External right and left ear normal. Oropharynx is clear and moist.  Eyes: Conjunctivae and EOM are normal. PERRLA, no scleral icterus. Neck: Normal ROM. Neck supple. No JVD. No tracheal deviation. No  thyromegaly. CVS: RRR, S1/S2 +, no murmurs, no gallops, no carotid bruit.  Pulmonary: Effort and breath sounds normal, no stridor, rhonchi, wheezes, rales.   Musculoskeletal: Normal range of motion. No edema and no tenderness.  Neuro: Alert. Normal reflexes, muscle tone coordination. No cranial nerve deficit. Skin: Skin is warm and dry. No rash noted. Not diaphoretic. No erythema. No pallor. Psychiatric: Normal mood and affect. Behavior, judgment, thought content normal.  Lab Results  Component Value Date   WBC 5.9 10/30/2015   HGB 12.4 (L) 10/30/2015   HCT 36.4 (L) 10/30/2015   MCV 83.5 10/30/2015   PLT 225 10/30/2015   Lab Results  Component Value Date   CREATININE 1.19 02/23/2018   BUN 14 02/23/2018   NA 140 02/23/2018   K 4.2 02/23/2018   CL 100 02/23/2018   CO2 24 02/23/2018    Lab Results  Component Value Date   HGBA1C 5.5 12/16/2014    Lipid Panel     Component Value Date/Time   CHOL 176 02/23/2018 1059   TRIG 61 02/23/2018 1059   HDL 60 02/23/2018 1059   CHOLHDL 2.9 02/23/2018 1059   CHOLHDL 3.4 12/16/2014 1249   VLDL 13 12/16/2014 1249   LDLCALC 104 (H) 02/23/2018 1059        Assessment and plan:  1. Essential hypertension, slightly elevated today however patient admits to smoking 2 cigarettes prior to his visit today.  He now has access to a home blood pressure monitoring device.  He has been given parameters of what measurements would require him to follow back up in the office sooner than his six-month follow-up.  He verbalized understanding.  For now we will continue with current hypertension medication dose and advised to take medication consistently as prescribed.  Continue low-sodium diet.  Continue engaging in daily walks.  Highly encouraged complete smoking cessation.  - lisinopril-hydrochlorothiazide (PRINZIDE,ZESTORETIC) 20-25 MG tablet; Take 1 tablet by mouth daily.  Dispense: 30 tablet; Refill: 6   Patient received an influenza vaccination today  during a flu clinic here at the office. He is overdue for Tdap however opts to get that at a future visit.  Return in about 6 months (around 03/21/2019) for Complete Physical Exam and fasting.  The patient was given clear instructions to go to ER or return to medical center if symptoms don't improve, worsen or new problems develop. The patient verbalized understanding. The patient was told to call to get lab results if they haven't heard anything in the next week.    Godfrey Pick. Tiburcio Pea, MSN, [facility]  [address] [place], [place] [address] [phone]   This note has been created with Dragon speech recognition software and smart  Lobbyist. Any transcriptional errors are unintentional.

## 2018-10-10 ENCOUNTER — Emergency Department (HOSPITAL_COMMUNITY)
Admission: EM | Admit: 2018-10-10 | Discharge: 2018-10-10 | Disposition: A | Discharge status: Home / Self Care | Payer: Self-pay | Attending: Emergency Medicine | Admitting: Emergency Medicine

## 2018-10-10 ENCOUNTER — Other Ambulatory Visit: Payer: Self-pay

## 2018-10-10 ENCOUNTER — Encounter (HOSPITAL_COMMUNITY): Payer: Self-pay

## 2018-10-10 ENCOUNTER — Emergency Department (HOSPITAL_COMMUNITY): Payer: Self-pay

## 2018-10-10 DIAGNOSIS — I10 Essential (primary) hypertension: Secondary | ICD-10-CM | POA: Insufficient documentation

## 2018-10-10 DIAGNOSIS — F17210 Nicotine dependence, cigarettes, uncomplicated: Secondary | ICD-10-CM | POA: Insufficient documentation

## 2018-10-10 DIAGNOSIS — Z79899 Other long term (current) drug therapy: Secondary | ICD-10-CM | POA: Insufficient documentation

## 2018-10-10 DIAGNOSIS — Z23 Encounter for immunization: Secondary | ICD-10-CM | POA: Insufficient documentation

## 2018-10-10 DIAGNOSIS — L03011 Cellulitis of right finger: Secondary | ICD-10-CM | POA: Insufficient documentation

## 2018-10-10 MED ORDER — LIDOCAINE HCL (PF) 1 % IJ SOLN
5.0000 mL | Freq: Once | INTRAMUSCULAR | Status: AC
  Administered 2018-10-10: 5 mL
  Filled 2018-10-10: qty 5

## 2018-10-10 MED ORDER — SULFAMETHOXAZOLE-TRIMETHOPRIM 800-160 MG PO TABS: 1.0000 | ORAL_TABLET | Freq: Two times a day (BID) | ORAL | 0 refills | Status: AC

## 2018-10-10 MED ORDER — TETANUS-DIPHTH-ACELL PERTUSSIS 5-2.5-18.5 LF-MCG/0.5 IM SUSP
0.5000 mL | Freq: Once | INTRAMUSCULAR | Status: AC
  Administered 2018-10-10: 0.5 mL via INTRAMUSCULAR
  Filled 2018-10-10: qty 0.5

## 2018-10-10 NOTE — ED Provider Notes (Signed)
[facility] EMERGENCY DEPARTMENT Provider Note   CSN: [id number] Arrival date & time: 10/10/18  1346     History   Chief Complaint Chief Complaint  Patient presents with  . Hand Pain    HPI Martin Mason is a 62 y.o. male.  62 year old male presents with complaint of swelling in his right thumb.  Patient is right-hand dominant, nondiabetic.  Patient states that 4 days ago he knocked over his knife block and the tip of the knife nicked his thumb.  Patient has been cleaning with peroxide however reports pain and swelling to the distal finger.  Last tetanus is unknown.  No other injuries, complaints, concerns.     Past Medical History:  Diagnosis Date  . Hypertension     Patient Active Problem List   Diagnosis Date Noted  . Tobacco abuse 03/01/2017  . Other male erectile dysfunction 06/18/2015  . Essential hypertension, benign 11/28/2013  . 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 FLANK PAIN, RIGHT 03/22/2011  . NEOPLASM, MALIGNANT, COLON, HX OF 03/22/2011  . 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 Essential hypertension 09/21/2010  . ELECTROCARDIOGRAM, ABNORMAL 09/21/2010  . ALCOHOLISM 10/27/2009    History reviewed. No pertinent surgical history.      Home Medications    Prior to Admission medications   Medication Sig Start Date End Date Taking? Authorizing Provider  lisinopril-hydrochlorothiazide (PRINZIDE,ZESTORETIC) 20-25 MG tablet Take 1 tablet by mouth daily. 09/20/18  Yes Bing Neighbors, FNP  sulfamethoxazole-trimethoprim (BACTRIM DS,SEPTRA DS) 800-160 MG tablet Take 1 tablet by mouth 2 (two) times daily for 10 days. 10/10/18 10/20/18  Jeannie Fend, PA-C    Family History Family History  Problem Relation Age of Onset  . Hypertension Mother   . Cancer Mother   . Hypertension Father     Social History Social History   Tobacco Use  . Smoking status: Current Every Day Smoker    Packs/day: 0.25    Types: Cigarettes    . Smokeless tobacco: Never Used  . Tobacco comment: 4 daily  Substance Use Topics  . Alcohol use: No    Comment: quit drinking 7 years ago after 30 years of drinking  . Drug use: Yes    Types: Marijuana    Comment: occ     Allergies   Patient has no known allergies.   Review of Systems Review of Systems  Constitutional: Negative for fever.  Musculoskeletal: Negative for arthralgias and myalgias.  Skin: Negative for rash and wound.  Allergic/Immunologic: Negative for immunocompromised state.  Neurological: Negative for weakness and numbness.  Hematological: Negative for adenopathy. Does not bruise/bleed easily.  All other systems reviewed and are negative.    Physical Exam Updated Vital Signs BP (!) 157/93 (BP Location: Left Arm)   Pulse 65   Temp 98.2 F (36.8 C) (Oral)   Resp 16   Ht 5\' 8"  (1.727 m)   Wt 53.8 kg   SpO2 100%   BMI 18.03 kg/m   Physical Exam  Constitutional: He is oriented to person, place, and time. He appears well-developed and well-nourished. No distress.  HENT:  Head: Normocephalic and atraumatic.  Cardiovascular: Intact distal pulses.  Pulmonary/Chest: Effort normal.  Musculoskeletal: Normal range of motion. He exhibits tenderness. He exhibits no deformity.       Hands: Neurological: He is alert and oriented to person, place, and time. No sensory deficit.  Skin: Skin is warm and dry. Capillary refill takes less than 2 seconds. He  is not diaphoretic. There is erythema.  Psychiatric: He has a normal mood and affect. His behavior is normal.  Nursing note and vitals reviewed.    ED Treatments / Results  Labs (all labs ordered are listed, but only abnormal results are displayed) Labs Reviewed - No data to display  EKG None  Radiology Dg Finger Thumb Right  Result Date: 10/10/2018 CLINICAL DATA:  Cut right thumb 5 days ago with a knife. Redness and swelling. Initial encounter. EXAM: RIGHT THUMB 2+V COMPARISON:  None. FINDINGS: There  is no evidence of fracture or dislocation. No opaque foreign body or soft tissue gas. Degenerative spurring at the MCP joint. IMPRESSION: No acute osseous finding or opaque foreign body. Electronically Signed   By: Marnee Spring M.D.   On: 10/10/2018 15:30    Procedures .Marland KitchenIncision and Drainage Date/Time: 10/10/2018 4:36 PM Performed by: Jeannie Fend, PA-C Authorized by: Jeannie Fend, PA-C   Consent:    Consent obtained:  Verbal   Consent given by:  Patient   Risks discussed:  Bleeding, incomplete drainage, pain and infection   Alternatives discussed:  No treatment Location:    Type:  Abscess   Location:  Upper extremity   Upper extremity location:  Finger   Finger location:  R thumb Pre-procedure details:    Skin preparation:  Betadine Anesthesia (see MAR for exact dosages):    Anesthesia method:  Nerve block   Block location:  Digital block   Block needle gauge:  27 G   Block anesthetic:  Lidocaine 1% w/o epi   Block technique:  Digital block   Block injection procedure:  Anatomic landmarks identified, anatomic landmarks palpated, introduced needle, negative aspiration for blood and incremental injection   Block outcome:  Anesthesia achieved Procedure type:    Complexity:  Simple Procedure details:    Incision types:  Stab incision   Incision depth:  Subcutaneous   Scalpel blade:  11   Wound management:  Irrigated with saline   Drainage:  Purulent   Drainage amount:  Moderate   Wound treatment:  Wound left open   Packing materials:  None Post-procedure details:    Patient tolerance of procedure:  Tolerated well, no immediate complications   (including critical care time)  Medications Ordered in ED Medications  Tdap (BOOSTRIX) injection 0.5 mL (0.5 mLs Intramuscular Given 10/10/18 1445)  lidocaine (PF) (XYLOCAINE) 1 % injection 5 mL (5 mLs Infiltration Given 10/10/18 1529)     Initial Impression / Assessment and Plan / ED Course  I have reviewed the triage  vital signs and the nursing notes.  Pertinent labs & imaging results that were available during my care of the patient were reviewed by me and considered in my medical decision making (see chart for details).  Clinical Course as of Oct 10 1638  Wed Oct 10, 2018  4041 62 year old male with left thumb pain.  Patient states that he nicked himself with a knife for 4 days ago, has been cleaning the area with peroxide however now has pain in the distal thumb.  Tetanus updated today.  Patient is right-hand dominant, nondiabetic.  No pain with range of motion, no pain with palpation along the flexor or extensor tendons, do not suspect tenosynovitis at this time.  Needle aspiration positive for purulent drainage, proceeded with simple I&D.  Procedure tolerated well, patient discharged home with Septra, recommend warm compresses.  Referred to hand Ortho for recheck in 2 days, if unable to be seen by Ortho  patient should see his PCP.  Return to the ER at anytime for worsening or concerning symptoms.   [LM]    Clinical Course User Index [LM] Jeannie Fend, PA-C   Final Clinical Impressions(s) / ED Diagnoses   Final diagnoses:  Felon of finger of right hand    ED Discharge Orders         Ordered    sulfamethoxazole-trimethoprim (BACTRIM DS,SEPTRA DS) 800-160 MG tablet  2 times daily     10/10/18 1600           Alden Hipp 10/10/18 1639    Terrilee Files, MD 10/10/18 1859

## 2018-10-10 NOTE — ED Triage Notes (Signed)
Pt endorses sticking himself with a kitchen knife in the right thumb 3 days ago by accident and now has pain and swelling. VSS. Not diabetic.

## 2018-10-10 NOTE — Discharge Instructions (Addendum)
Call orthopedics for follow up. If unable to be seen by orthopedics, recheck with your doctor. Take Septra as prescribed and complete the full course. Warm compresses to thumb for 20 minutes at a time. STOP peroxide, clean with mild soap on a q-tip.

## 2018-10-10 NOTE — ED Provider Notes (Signed)
Patient placed in Quick Look pathway, seen and evaluated   Chief Complaint: Right thumb pain  HPI:   Patient presents today for evaluation of pain in his right thumb.  4 days ago he was in the kitchen and accidentally stabbed the distal aspect of his thumb with a kitchen knife.  Since then has been swelling, achy pain.  No fevers.  ROS: No fevers (one)  Physical Exam:   Gen: No distress  Neuro: Awake and Alert  Skin: Warm    Focused Exam: Right thumb is slightly swollen distally.   Initiation of care has begun. The patient has been counseled on the process, plan, and necessity for staying for the completion/evaluation, and the remainder of the medical screening examination    Norman Clay 10/10/18 1357    Loren Racer, MD 10/16/18 [phone]

## 2018-10-10 NOTE — ED Notes (Signed)
Declined W/C at D/C and was escorted to lobby by RN. 

## 2018-10-17 ENCOUNTER — Ambulatory Visit: Payer: Self-pay | Admitting: Family Medicine

## 2018-10-17 ENCOUNTER — Other Ambulatory Visit: Payer: Self-pay

## 2018-10-17 ENCOUNTER — Ambulatory Visit: Payer: Self-pay | Attending: Family Medicine | Admitting: Family Medicine

## 2018-10-17 ENCOUNTER — Encounter: Payer: Self-pay | Admitting: Family Medicine

## 2018-10-17 VITALS — BP 123/74 | HR 70 | Temp 98.1°F | Resp 18 | Ht 68.0 in | Wt 116.8 lb

## 2018-10-17 DIAGNOSIS — Z833 Family history of diabetes mellitus: Secondary | ICD-10-CM | POA: Insufficient documentation

## 2018-10-17 DIAGNOSIS — F17210 Nicotine dependence, cigarettes, uncomplicated: Secondary | ICD-10-CM | POA: Insufficient documentation

## 2018-10-17 DIAGNOSIS — L03011 Cellulitis of right finger: Secondary | ICD-10-CM

## 2018-10-17 DIAGNOSIS — Z8249 Family history of ischemic heart disease and other diseases of the circulatory system: Secondary | ICD-10-CM | POA: Insufficient documentation

## 2018-10-17 DIAGNOSIS — I10 Essential (primary) hypertension: Secondary | ICD-10-CM | POA: Insufficient documentation

## 2018-10-17 NOTE — Progress Notes (Signed)
Subjective:    Patient ID: Martin Mason, male    DOB: Oct 12, 1956, 62 y.o.   MRN: [id number]  HPI  62 yo male who is seen status post recent emergency department visit on 10/10/2018 after nicking the end of his right thumb with a knife.  Patient was diagnosed as having a felon and open incision and drainage was done.  Patient is seen today for follow-up.  Patient reports that he has no current pain at the wound site.  Patient has seen no increased redness, no increased swelling and no pus.  Patient does not have any difficulty moving his right thumb.  Patient denies any numbness or tingling in the right thumb.  Patient is currently taking his antibiotic, Septra DS prescribed at the emergency department.  Patient does have the bottle with him.  Patient reports he also continues to take his blood pressure medication on a daily basis.  Patient denies any fever or chills, no stomach upset with the use of antibiotic medication.  Patient states that overall he feels well. Past Medical History:  Diagnosis Date  . Hypertension    Family History  Problem Relation Age of Onset  . Hypertension Mother   . Cancer Mother   . Hypertension Father   . Diabetes Sister    Social History   Tobacco Use  . Smoking status: Current Every Day Smoker    Packs/day: 0.25    Types: Cigarettes  . Smokeless tobacco: Never Used  . Tobacco comment: 4 daily  Substance Use Topics  . Alcohol use: No    Comment: quit drinking 7 years ago after 30 years of drinking  . Drug use: Yes    Types: Marijuana    Comment: occ  No Known Allergies   Review of Systems  Constitutional: Negative for chills, diaphoresis and fever.  Respiratory: Negative for shortness of breath and stridor.   Cardiovascular: Negative for chest pain and palpitations.  Gastrointestinal: Negative for abdominal pain, diarrhea and nausea.  Musculoskeletal: Negative for arthralgias and joint swelling.  Neurological: Negative for dizziness and  headaches.       Objective:   Physical Exam BP 123/74   Pulse 70   Temp 98.1 F (36.7 C) (Oral)   Resp 18   Ht 5\' 8"  (1.727 m)   Wt 116 lb 12.8 oz (53 kg)   SpO2 96%   BMI 17.76 kg/m Vital signs and nurse's notes reviewed General- thin framed older male in no acute distress Lungs-clear to auscultation bilaterally Cardiovascular-regular rate and rhythm Skin-patient with scab on the right lateral tip of the finger proximal to the nail.  There does appear to be some mild edema/swelling in the right tip of the finger but there is no increased warmth and no presence of pus.  Patient with darker scan and difficult to appreciate/observe capillary refill but there are no signs of cyanosis.  Patient with normal right radial pulse.  Patient does not have any limitations of range of motion.  Patient appears to have a pre-existing deformity of the right thumb/base of the thumb but denies any prior injury and no pain.       Assessment & Plan:  1. Felon of finger of right hand Patient status post emergency room visit on 10/10/2018 for treatment of infection in the tip of the right thumb/felon.  Patient's note was reviewed.  Patient had incision and drainage of the affected area.  Patient did have x-ray which showed no foreign body or soft tissue gas.  Patient did have some degenerative spurring at the MCP joint.  Patient now reports no pain and patient does not have any signs/symptoms consistent with continued infection.  Patient is to continue the Septra DS that was prescribed at the emergency department and patient was encouraged to complete the entire prescription and drink plenty of water while taking this medication.  A clean sterile bandage was reapplied to the healing site status post examination.  Patient was made aware to follow-up with the has any increased pain, redness or pus at the wound site.  Patient also received tetanus immunization during his emergency department visit.  An After Visit  Summary was printed and given to the patient.  Return if symptoms worsen or fail to improve, for keep scheduled f/u with PCP.

## 2018-10-17 NOTE — Progress Notes (Signed)
Pain: 0 Right finger: 10/10/18, cut finger with kitchen knife

## 2018-10-17 NOTE — Patient Instructions (Signed)
Fingertip Infection  There are two main types of fingertip infections:   Paronychia. This is an infection that happens around your nail. This type of infection can start suddenly in one nail or occur gradually over time and affect more than one nail. Long-term (chronic) paronychia can make your fingernails thick and deformed.   Felon. This is a bacterial infection in the padded tip of your finger. Felon infection can cause a painful collection of pus (abscess) to form inside your fingertip. If the infection is not treated, the infection can spread as deep as the bone.    What are the causes?  Paronychia infection can be caused by bacteria, funguses, or a mix of both. Felon infection is usually caused by the bacteria that are normally found on your skin. An infection can develop if the bacteria spread through your skin to the pad of tissue inside your fingertip.  What increases the risk?  A fingertip infection is more likely to develop in people:   Who have diabetes.   Who have a weak body defense (immune) system.   Who work with their hands.   Whose hands are exposed to moisture, chemicals, or irritants for long periods of time.   Who have poor circulation.   Who bite, chew, or pick their fingernails.    What are the signs or symptoms?  Symptoms of paronychia may affect one or more fingernails and include:   Pain, swelling, and redness around the nail.   Pus-filled pockets at the base or side of the fingernail (cuticle).   Thick fingernails that separate from the nail bed.   Pus that drains from the nail bed.    Symptoms of felon usually affect just one fingertip pad and include:   Severe, throbbing pain.   Redness.   Swelling.   Warmth.   Tenderness when the affected fingertip is touched.    How is this diagnosed?  A fingertip infection is diagnosed with a medical history and physical exam. If there is pus draining from the infection, it may be swabbed and sent to the lab for a culture. An X-ray  may be done to see if the infection has spread to the bone.  How is this treated?  Treatment for a fingertip infection may include:   Warm water or salt-water soaks several times per day.   Antibiotic medicine. This may be an ointment or pills.   Steroid ointment.   Antifungal pills.   Drainage of pus pockets. This is done by making a surgical cut (incision) to open the fingertip to drain pus.   Wearing gloves to protect your nails.    Follow these instructions at home:  Medicines   Take or apply over-the-counter and prescription medicines only as told by your health care provider.   If you were prescribed antibiotic medicine, take or apply it as told by your health care provider. Do not stop using the antibiotic even if your condition improves.  Wound care   Clean the infected area each day with warm water or salt water, or as told by your health care provider.  ? Gently wash the infected area with mild soap and water.  ? Rinse the infected area with water to remove all soap.  ? Pat the infected area dry with a clean towel. Do not rub it.  ? To make a salt-water mixture, completely dissolve -1 tsp of salt in 1 cup of warm water.   Follow instructions from your health care provider   about:  ? How to take care of the infection.  ? When and how you should change your bandage (dressing).  ? When you should remove your dressing.   Check the infected area every day for more signs of infection. Watch for:  ? More redness, swelling, or pain.  ? More fluid or blood.  ? Warmth.  ? A bad smell.  General instructions   Keep the dressing dry until your health care provider says it can be removed. Do not take baths, swim, use a hot tub, or do anything that would put your wound underwater until your health care provider approves.   Raise (elevate) the infected area above the level of your heart while you are sitting or lying down or as told by your health care provider.   Do not scratch or pick at the  infection.   Wear gloves as told by your health care provider, if this applies.   Keep all follow-up visits as told by your health care provider. This is important.  How is this prevented?   Wear gloves when you work with your hands.   Wash your hands often with antibacterial soap.   Avoid letting your hands stay wet or irritated for long periods of time.   Do not bite your fingernails. Do not pull on your cuticles. Do not suck on your fingers.   Use clean scissors or nail clippers to trim your nails. Do not cut your fingernails very short.  Contact a health care provider if:   Your pain medicine is not helping.   You have more redness, swelling, or pain at your fingertip.   You continue to have fluid, blood, or pus coming from your fingertip.   Your infection area feels warm to the touch.   You continue to notice a bad smell coming from your fingertip or your dressing.  Get help right away if:   The area of redness is spreading, or you notice a red streak going away from your fingertip.   You have a fever.  This information is not intended to replace advice given to you by your health care provider. Make sure you discuss any questions you have with your health care provider.  Document Released: 01/19/2005 Document Revised: 05/19/2016 Document Reviewed: 06/01/2015  Elsevier Interactive Patient Education  2018 [employer] Inc[employer]

## 2019-01-08 ENCOUNTER — Ambulatory Visit: Payer: Self-pay | Attending: Internal Medicine | Admitting: Internal Medicine

## 2019-01-08 ENCOUNTER — Encounter: Payer: Self-pay | Admitting: Internal Medicine

## 2019-01-08 VITALS — BP 110/78 | HR 68 | Temp 98.9°F

## 2019-01-08 DIAGNOSIS — L853 Xerosis cutis: Secondary | ICD-10-CM | POA: Insufficient documentation

## 2019-01-08 DIAGNOSIS — Z79899 Other long term (current) drug therapy: Secondary | ICD-10-CM | POA: Insufficient documentation

## 2019-01-08 DIAGNOSIS — I10 Essential (primary) hypertension: Secondary | ICD-10-CM | POA: Insufficient documentation

## 2019-01-08 MED ORDER — LISINOPRIL-HYDROCHLOROTHIAZIDE 20-25 MG PO TABS: 1.0000 | ORAL_TABLET | Freq: Every day | ORAL | 0 refills | Status: DC

## 2019-01-08 NOTE — Progress Notes (Signed)
Itchy rash on legs x 3 day, applied vaseline w improved sx. Otherwise feels well, no f/c, no fatigue, no new meds, lotions, creams Also req refill of bp meds. No h/a, no c/p, no sob, no dizziness, takes meds as prescribed.   Patient Active Problem List   Diagnosis Date Noted  . Tobacco abuse 03/01/2017  . Other male erectile dysfunction 06/18/2015  . Essential hypertension, benign 11/28/2013  . 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 FLANK PAIN, RIGHT 03/22/2011  . NEOPLASM, MALIGNANT, COLON, HX OF 03/22/2011  . 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 Essential hypertension 09/21/2010  . ELECTROCARDIOGRAM, ABNORMAL 09/21/2010  . ALCOHOLISM 10/27/2009    Current Outpatient Medications:  .  lisinopril-hydrochlorothiazide (PRINZIDE,ZESTORETIC) 20-25 MG tablet, Take 1 tablet by mouth daily., Disp: 90 tablet, Rfl: 0  No Known Allergies  Male in NAD BP 110/78 (BP Location: Left Arm, Patient Position: Sitting, Cuff Size: Normal)   Pulse 68   Temp 98.9 F (37.2 C) (Oral)   CV: rrr Lungs: cta Skin: diffuse , hyperpig, patch left ant thigh, r post thigh, no erythema  A/p 1. Xerosis of skin- discussed preventive measures, continue vaseline, add hc cr qd prn  2. Essential hypertension- controlled, rx refilled, rv for annual physical/labs in 2 mos - lisinopril-hydrochlorothiazide (PRINZIDE,ZESTORETIC) 20-25 MG tablet; Take 1 tablet by mouth daily.  Dispense: 90 tablet; Refill: 0

## 2019-01-08 NOTE — Patient Instructions (Signed)
Use mild soap and detergent Avoid hot showers Continue vaseline  Hydrocortisone cream 1% daily as needed

## 2019-07-02 ENCOUNTER — Encounter: Payer: Self-pay | Admitting: Family Medicine

## 2019-07-02 ENCOUNTER — Ambulatory Visit: Payer: Self-pay | Attending: Family Medicine | Admitting: Family Medicine

## 2019-07-02 ENCOUNTER — Other Ambulatory Visit: Payer: Self-pay

## 2019-07-02 DIAGNOSIS — I10 Essential (primary) hypertension: Secondary | ICD-10-CM

## 2019-07-02 DIAGNOSIS — F17210 Nicotine dependence, cigarettes, uncomplicated: Secondary | ICD-10-CM

## 2019-07-02 DIAGNOSIS — Z720 Tobacco use: Secondary | ICD-10-CM

## 2019-07-02 MED ORDER — LISINOPRIL-HYDROCHLOROTHIAZIDE 20-25 MG PO TABS: 1.0000 | ORAL_TABLET | Freq: Every day | ORAL | 1 refills | Status: DC

## 2019-07-02 NOTE — Progress Notes (Signed)
Virtual Visit via Telephone Note  I connected with Martin Mason, on 07/02/2019 at 2:07 PM by telephone due to the COVID-19 pandemic and verified that I am speaking with the correct person using two identifiers.   Consent: I discussed the limitations, risks, security and privacy concerns of performing an evaluation and management service by telephone and the availability of in person appointments. I also discussed with the patient that there may be a patient responsible charge related to this service. The patient expressed understanding and agreed to proceed.   Location of Patient: Home  Location of Provider: Clinic   Persons participating in Telemedicine visit: Deionte Spivack [place]-CMA Dr. Felecia Shelling     History of Present Illness: Martin Mason is a 63 year old with a history of hypertension, tobacco abuse who presents today for follow-up visit. He endorses compliance with his antihypertensive and is requesting a refill today.  Compliant with a regular exercise regimen as he walks every day and he is also adhering to a low-sodium diet. He currently smokes 3 cigarettes/day and is not ready to quit at this time despite discussing different nicotine replacement therapy options.  Denies chest pain, dyspnea, paroxysmal nocturnal dyspnea, pedal edema.  Past Medical History:  Diagnosis Date  . Hypertension    No Known Allergies  Current Outpatient Medications on File Prior to Visit  Medication Sig Dispense Refill  . lisinopril-hydrochlorothiazide (PRINZIDE,ZESTORETIC) 20-25 MG tablet Take 1 tablet by mouth daily. 90 tablet 0   No current facility-administered medications on file prior to visit.     Observations/Objective: Awake, alert, oriented x3 Not in acute distress   CMP Latest Ref Rng & Units 02/23/2018 03/01/2017 10/30/2015  Glucose 65 - 99 mg/dL 91 81 87  BUN 8 - 27 mg/dL 14 14 9   Creatinine 0.76 - 1.27 mg/dL 1.19 1.24 1.30(H)  Sodium 134 - 144 mmol/L 140 139 137   Potassium 3.5 - 5.2 mmol/L 4.2 3.9 3.1(L)  Chloride 96 - 106 mmol/L 100 101 96(L)  CO2 20 - 29 mmol/L 24 27 25   Calcium 8.6 - 10.2 mg/dL 9.9 9.8 9.8  Total Protein 6.0 - 8.5 g/dL 7.4 7.4 -  Total Bilirubin 0.0 - 1.2 mg/dL 0.6 0.6 -  Alkaline Phos 39 - 117 IU/L 58 56 -  AST 0 - 40 IU/L 20 17 -  ALT 0 - 44 IU/L 11 8(L) -    Lipid Panel     Component Value Date/Time   CHOL 176 02/23/2018 1059   TRIG 61 02/23/2018 1059   HDL 60 02/23/2018 1059   CHOLHDL 2.9 02/23/2018 1059   CHOLHDL 3.4 12/16/2014 1249   VLDL 13 12/16/2014 1249   LDLCALC 104 (H) 02/23/2018 1059     Assessment and Plan: 1. Essential hypertension Controlled Counseled on blood pressure goal of less than 130/80, low-sodium, DASH diet, medication compliance, 150 minutes of moderate intensity exercise per week. Discussed medication compliance, adverse effects. - lisinopril-hydrochlorothiazide (ZESTORETIC) 20-25 MG tablet; Take 1 tablet by mouth daily.  Dispense: 90 tablet; Refill: 1 - CMP14+EGFR; Future - Lipid panel; Future  2. Tobacco abuse Spent 3 minutes counseling on cessation, hazardous effects of ongoing tobacco use and he is not ready to quit at this time   Follow Up Instructions: 3 months   I discussed the assessment and treatment plan with the patient. The patient was provided an opportunity to ask questions and all were answered. The patient agreed with the plan and demonstrated an understanding of the instructions.   The patient was  advised to call back or seek an in-person evaluation if the symptoms worsen or if the condition fails to improve as anticipated.     I provided 10 minutes total of non-face-to-face time during this encounter including median intraservice time, reviewing previous notes, labs, imaging, medications, management and patient verbalized understanding.     Charlott Rakes, MD, FAAFP. [facility] and [place], [place]   07/02/2019,  2:07 PM

## 2019-07-02 NOTE — Progress Notes (Signed)
Patient has been called and DOB has been verified. Patient has been screened and transferred to PCP to start phone visit.     

## 2019-07-03 ENCOUNTER — Ambulatory Visit: Payer: Self-pay | Attending: Family Medicine

## 2019-07-03 DIAGNOSIS — I10 Essential (primary) hypertension: Secondary | ICD-10-CM

## 2019-07-04 ENCOUNTER — Telehealth: Payer: Self-pay

## 2019-07-04 LAB — CMP14+EGFR
ALT: 10 IU/L (ref 0–44)
AST: 16 IU/L (ref 0–40)
Albumin/Globulin Ratio: 1.6 (ref 1.2–2.2)
Albumin: 4.6 g/dL (ref 3.8–4.8)
Alkaline Phosphatase: 63 IU/L (ref 39–117)
BUN/Creatinine Ratio: 8 — ABNORMAL LOW (ref 10–24)
BUN: 10 mg/dL (ref 8–27)
Bilirubin Total: 0.4 mg/dL (ref 0.0–1.2)
CO2: 22 mmol/L (ref 20–29)
Calcium: 9.8 mg/dL (ref 8.6–10.2)
Chloride: 100 mmol/L (ref 96–106)
Creatinine, Ser: 1.26 mg/dL (ref 0.76–1.27)
GFR calc Af Amer: 69 mL/min/{1.73_m2} (ref >59)
GFR calc non Af Amer: 60 mL/min/{1.73_m2} (ref >59)
Globulin, Total: 2.8 g/dL (ref 1.5–4.5)
Glucose: 89 mg/dL (ref 65–99)
Potassium: 4.3 mmol/L (ref 3.5–5.2)
Sodium: 140 mmol/L (ref 134–144)
Total Protein: 7.4 g/dL (ref 6.0–8.5)

## 2019-07-04 LAB — LIPID PANEL
Chol/HDL Ratio: 2.9 ratio (ref 0.0–5.0)
Cholesterol, Total: 156 mg/dL (ref 100–199)
HDL: 54 mg/dL (ref >39)
LDL Calculated: 89 mg/dL (ref 0–99)
Triglycerides: 67 mg/dL (ref 0–149)
VLDL Cholesterol Cal: 13 mg/dL (ref 5–40)

## 2019-07-04 NOTE — Telephone Encounter (Signed)
-----   Message from Charlott Rakes, MD sent at 07/04/2019  8:28 AM EDT ----- Cholesterol and other labs are normal

## 2019-07-04 NOTE — Telephone Encounter (Signed)
Patient name and DOB has been verified Patient was informed of lab results. Patient had no questions.  

## 2019-08-17 ENCOUNTER — Other Ambulatory Visit: Payer: Self-pay

## 2019-08-17 DIAGNOSIS — Z20822 Contact with and (suspected) exposure to covid-19: Secondary | ICD-10-CM

## 2019-08-18 LAB — NOVEL CORONAVIRUS, NAA: SARS-CoV-2, NAA: NOT DETECTED

## 2019-08-20 ENCOUNTER — Telehealth: Payer: Self-pay | Admitting: Family Medicine

## 2019-08-20 NOTE — Telephone Encounter (Signed)
Patient called in and received his covid test results °

## 2019-10-01 ENCOUNTER — Other Ambulatory Visit: Payer: Self-pay

## 2019-10-01 ENCOUNTER — Ambulatory Visit: Payer: Self-pay | Attending: Family Medicine | Admitting: Family Medicine

## 2019-10-01 ENCOUNTER — Encounter: Payer: Self-pay | Admitting: Family Medicine

## 2019-10-01 VITALS — BP 148/72 | HR 65 | Temp 98.7°F | Ht 68.0 in | Wt 119.0 lb

## 2019-10-01 DIAGNOSIS — I10 Essential (primary) hypertension: Secondary | ICD-10-CM

## 2019-10-01 DIAGNOSIS — Z720 Tobacco use: Secondary | ICD-10-CM

## 2019-10-01 MED ORDER — LISINOPRIL-HYDROCHLOROTHIAZIDE 20-25 MG PO TABS: 1.0000 | ORAL_TABLET | Freq: Every day | ORAL | 1 refills | Status: DC

## 2019-10-01 NOTE — Patient Instructions (Signed)
Steps to Quit Smoking Smoking tobacco is the leading cause of preventable death. It can affect almost every organ in the body. Smoking puts you and people around you at risk for many serious, long-lasting (chronic) diseases. Quitting smoking can be hard, but it is one of the best things that you can do for your health. It is never too late to quit. How do I get ready to quit? When you decide to quit smoking, make a plan to help you succeed. Before you quit:  Pick a date to quit. Set a date within the next 2 weeks to give you time to prepare.  Write down the reasons why you are quitting. Keep this list in places where you will see it often.  Tell your family, friends, and co-workers that you are quitting. Their support is important.  Talk with your doctor about the choices that may help you quit.  Find out if your health insurance will pay for these treatments.  Know the people, places, things, and activities that make you want to smoke (triggers). Avoid them. What first steps can I take to quit smoking?  Throw away all cigarettes at home, at work, and in your car.  Throw away the things that you use when you smoke, such as ashtrays and lighters.  Clean your car. Make sure to empty the ashtray.  Clean your home, including curtains and carpets. What can I do to help me quit smoking? Talk with your doctor about taking medicines and seeing a counselor at the same time. You are more likely to succeed when you do both.  If you are pregnant or breastfeeding, talk with your doctor about counseling or other ways to quit smoking. Do not take medicine to help you quit smoking unless your doctor tells you to do so. To quit smoking: Quit right away  Quit smoking totally, instead of slowly cutting back on how much you smoke over a period of time.  Go to counseling. You are more likely to quit if you go to counseling sessions regularly. Take medicine You may take medicines to help you quit. Some  medicines need a prescription, and some you can buy over-the-counter. Some medicines may contain a drug called nicotine to replace the nicotine in cigarettes. Medicines may:  Help you to stop having the desire to smoke (cravings).  Help to stop the problems that come when you stop smoking (withdrawal symptoms). Your doctor may ask you to use:  Nicotine patches, gum, or lozenges.  Nicotine inhalers or sprays.  Non-nicotine medicine that is taken by mouth. Find resources Find resources and other ways to help you quit smoking and remain smoke-free after you quit. These resources are most helpful when you use them often. They include:  Online chats with a counselor.  Phone quitlines.  Printed self-help materials.  Support groups or group counseling.  Text messaging programs.  Mobile phone apps. Use apps on your mobile phone or tablet that can help you stick to your quit plan. There are many free apps for mobile phones and tablets as well as websites. Examples include Quit Guide from the CDC and smokefree.gov  What things can I do to make it easier to quit?   Talk to your family and friends. Ask them to support and encourage you.  Call a phone quitline (1-800-QUIT-NOW), reach out to support groups, or work with a counselor.  Ask people who smoke to not smoke around you.  Avoid places that make you want to smoke,   such as: ? Bars. ? Parties. ? Smoke-break areas at work.  Spend time with people who do not smoke.  Lower the stress in your life. Stress can make you want to smoke. Try these things to help your stress: ? Getting regular exercise. ? Doing deep-breathing exercises. ? Doing yoga. ? Meditating. ? Doing a body scan. To do this, close your eyes, focus on one area of your body at a time from head to toe. Notice which parts of your body are tense. Try to relax the muscles in those areas. How will I feel when I quit smoking? Day 1 to 3 weeks Within the first 24 hours,  you may start to have some problems that come from quitting tobacco. These problems are very bad 2-3 days after you quit, but they do not often last for more than 2-3 weeks. You may get these symptoms:  Mood swings.  Feeling restless, nervous, angry, or annoyed.  Trouble concentrating.  Dizziness.  Strong desire for high-sugar foods and nicotine.  Weight gain.  Trouble pooping (constipation).  Feeling like you may vomit (nausea).  Coughing or a sore throat.  Changes in how the medicines that you take for other issues work in your body.  Depression.  Trouble sleeping (insomnia). Week 3 and afterward After the first 2-3 weeks of quitting, you may start to notice more positive results, such as:  Better sense of smell and taste.  Less coughing and sore throat.  Slower heart rate.  Lower blood pressure.  Clearer skin.  Better breathing.  Fewer sick days. Quitting smoking can be hard. Do not give up if you fail the first time. Some people need to try a few times before they succeed. Do your best to stick to your quit plan, and talk with your doctor if you have any questions or concerns. Summary  Smoking tobacco is the leading cause of preventable death. Quitting smoking can be hard, but it is one of the best things that you can do for your health.  When you decide to quit smoking, make a plan to help you succeed.  Quit smoking right away, not slowly over a period of time.  When you start quitting, seek help from your doctor, family, or friends. This information is not intended to replace advice given to you by your health care provider. Make sure you discuss any questions you have with your health care provider. Document Released: 10/08/2009 Document Revised: 03/01/2019 Document Reviewed: 03/02/2019 Elsevier Patient Education  2020 Elsevier Inc.  

## 2019-10-01 NOTE — Progress Notes (Signed)
Subjective:  Patient ID: Martin Mason, male    DOB: Nov 01, 1956  Age: 63 y.o. MRN: [id number]  CC: Hypertension   HPI Martin Mason is a 63 year old with a history of hypertension, tobacco abuse who presents today for follow-up visit. He endorses compliance with his medication and denies adverse effects.  We had discussed smoking cessation at his last visit and he is still not ready to quit after discussion again today.  He informs me he needs to smoke when his girlfriend gets on his nerves. He denies chest pain, dyspnea. Agreeable to receiving flu shot today He received his Medicare card and informs me a different PCP is listed on it.  He will be probably transferring his care after today.  Past Medical History:  Diagnosis Date  . Hypertension     No past surgical history on file.  Family History  Problem Relation Age of Onset  . Hypertension Mother   . Cancer Mother   . Hypertension Father   . Diabetes Sister     No Known Allergies  Outpatient Medications Prior to Visit  Medication Sig Dispense Refill  . lisinopril-hydrochlorothiazide (ZESTORETIC) 20-25 MG tablet Take 1 tablet by mouth daily. 90 tablet 1   No facility-administered medications prior to visit.      ROS Review of Systems  Constitutional: Negative for activity change and appetite change.  HENT: Negative for sinus pressure and sore throat.   Eyes: Negative for visual disturbance.  Respiratory: Negative for cough, chest tightness and shortness of breath.   Cardiovascular: Negative for chest pain and leg swelling.  Gastrointestinal: Negative for abdominal distention, abdominal pain, constipation and diarrhea.  Endocrine: Negative.   Genitourinary: Negative for dysuria.  Musculoskeletal: Negative for joint swelling and myalgias.  Skin: Negative for rash.  Allergic/Immunologic: Negative.   Neurological: Negative for weakness, light-headedness and numbness.  Psychiatric/Behavioral: Negative for dysphoric  mood and suicidal ideas.    Objective:  BP (!) 148/72   Pulse 65   Temp 98.7 F (37.1 C) (Oral)   Ht 5\' 8"  (1.727 m)   Wt 119 lb (54 kg)   SpO2 100%   BMI 18.09 kg/m   BP/Weight 10/01/2019 01/08/2019 10/17/2018  Systolic BP 148 110 123  Diastolic BP 72 78 74  Wt. (Lbs) 119 - 116.8  BMI 18.09 - 17.76      Physical Exam Constitutional:      Appearance: He is well-developed.  Neck:     Vascular: No JVD.  Cardiovascular:     Rate and Rhythm: Normal rate.     Heart sounds: Normal heart sounds. No murmur.  Pulmonary:     Effort: Pulmonary effort is normal.     Breath sounds: Normal breath sounds. No wheezing or rales.  Chest:     Chest wall: No tenderness.  Abdominal:     General: Bowel sounds are normal. There is no distension.     Palpations: Abdomen is soft. There is no mass.     Tenderness: There is no abdominal tenderness.  Musculoskeletal: Normal range of motion.     Right lower leg: No edema.     Left lower leg: No edema.  Neurological:     Mental Status: He is alert and oriented to person, place, and time.  Psychiatric:        Mood and Affect: Mood normal.     CMP Latest Ref Rng & Units 07/03/2019 02/23/2018 03/01/2017  Glucose 65 - 99 mg/dL 89 91 81  BUN 8 - 27  mg/dL 10 14 14   Creatinine 0.76 - 1.27 mg/dL 1.26 1.19 1.24  Sodium 134 - 144 mmol/L 140 140 139  Potassium 3.5 - 5.2 mmol/L 4.3 4.2 3.9  Chloride 96 - 106 mmol/L 100 100 101  CO2 20 - 29 mmol/L 22 24 27   Calcium 8.6 - 10.2 mg/dL 9.8 9.9 9.8  Total Protein 6.0 - 8.5 g/dL 7.4 7.4 7.4  Total Bilirubin 0.0 - 1.2 mg/dL 0.4 0.6 0.6  Alkaline Phos 39 - 117 IU/L 63 58 56  AST 0 - 40 IU/L 16 20 17   ALT 0 - 44 IU/L 10 11 8(L)    Lipid Panel     Component Value Date/Time   CHOL 156 07/03/2019 1105   TRIG 67 07/03/2019 1105   HDL 54 07/03/2019 1105   CHOLHDL 2.9 07/03/2019 1105   CHOLHDL 3.4 12/16/2014 1249   VLDL 13 12/16/2014 1249   LDLCALC 89 07/03/2019 1105    CBC    Component Value Date/Time    WBC 5.9 10/30/2015 2131   RBC 4.36 10/30/2015 2131   HGB 12.4 (L) 10/30/2015 2131   HCT 36.4 (L) 10/30/2015 2131   PLT 225 10/30/2015 2131   MCV 83.5 10/30/2015 2131   MCH 28.4 10/30/2015 2131   MCHC 34.1 10/30/2015 2131   RDW 13.6 10/30/2015 2131   LYMPHSABS 2.0 12/16/2014 1249   MONOABS 0.5 12/16/2014 1249   EOSABS 0.3 12/16/2014 1249   BASOSABS 0.1 12/16/2014 1249    Lab Results  Component Value Date   HGBA1C 5.5 12/16/2014    Assessment & Plan:   1. Essential hypertension Slightly elevated above goal No regimen change at this time but will encourage to comply with lifestyle modifications including smoking cessation Counseled on blood pressure goal of less than 130/80, low-sodium, DASH diet, medication compliance, 150 minutes of moderate intensity exercise per week. Discussed medication compliance, adverse effects. - lisinopril-hydrochlorothiazide (ZESTORETIC) 20-25 MG tablet; Take 1 tablet by mouth daily.  Dispense: 90 tablet; Refill: 1  2. Tobacco abuse Spent 3 minutes counseling on smoking cessation and he is not ready to quit at this time and is not interested in nicotine replacement therapy   Health Care Maintenance: Flu shot today. Meds ordered this encounter  Medications  . lisinopril-hydrochlorothiazide (ZESTORETIC) 20-25 MG tablet    Sig: Take 1 tablet by mouth daily.    Dispense:  90 tablet    Refill:  1    Follow-up: Return in about 6 months (around 03/31/2020) for medical conditions.       Charlott Rakes, MD, FAAFP. [facility], [place]   10/01/2019, 4:28 PM

## 2020-03-31 ENCOUNTER — Encounter: Payer: Self-pay | Admitting: Family Medicine

## 2020-03-31 ENCOUNTER — Other Ambulatory Visit: Payer: Self-pay

## 2020-03-31 ENCOUNTER — Ambulatory Visit: Payer: Medicare Other | Attending: Family Medicine | Admitting: Family Medicine

## 2020-03-31 VITALS — BP 129/72 | HR 74 | Ht 68.0 in | Wt 121.0 lb

## 2020-03-31 DIAGNOSIS — Z8249 Family history of ischemic heart disease and other diseases of the circulatory system: Secondary | ICD-10-CM | POA: Diagnosis not present

## 2020-03-31 DIAGNOSIS — J310 Chronic rhinitis: Secondary | ICD-10-CM

## 2020-03-31 DIAGNOSIS — Z79899 Other long term (current) drug therapy: Secondary | ICD-10-CM | POA: Diagnosis not present

## 2020-03-31 DIAGNOSIS — Z833 Family history of diabetes mellitus: Secondary | ICD-10-CM | POA: Insufficient documentation

## 2020-03-31 DIAGNOSIS — J329 Chronic sinusitis, unspecified: Secondary | ICD-10-CM

## 2020-03-31 DIAGNOSIS — I10 Essential (primary) hypertension: Secondary | ICD-10-CM | POA: Insufficient documentation

## 2020-03-31 MED ORDER — LISINOPRIL-HYDROCHLOROTHIAZIDE 20-25 MG PO TABS: 1.0000 | ORAL_TABLET | Freq: Every day | ORAL | 1 refills | Status: DC

## 2020-03-31 MED ORDER — CETIRIZINE HCL 10 MG PO TABS: 10.0000 mg | ORAL_TABLET | Freq: Every day | ORAL | 1 refills | Status: DC

## 2020-03-31 NOTE — Progress Notes (Signed)
Right side nostril has green mucus when he blows his nose.

## 2020-03-31 NOTE — Patient Instructions (Signed)

## 2020-03-31 NOTE — Progress Notes (Signed)
Subjective:  Patient ID: Martin Mason, male    DOB: 13-Aug-1956  Age: 64 y.o. MRN: [id number]  CC: Hypertension   HPI Martin Mason  is a64 year old with a history of hypertension, tobacco abuse who presents today for follow-up visit.  For the last one week he has noticed greenish mucus from his R nostril but denies post nasal drip, sinus pressure, headache, otalgia, ear pressure. No chest pain, disypnea. He has gotten both doses of his COVID-19 vaccine.  Doing well on his antihypertensive and is tolerating it well with no adverse effects.  Past Medical History:  Diagnosis Date  . Hypertension     No past surgical history on file.  Family History  Problem Relation Age of Onset  . Hypertension Mother   . Cancer Mother   . Hypertension Father   . Diabetes Sister     No Known Allergies  Outpatient Medications Prior to Visit  Medication Sig Dispense Refill  . lisinopril-hydrochlorothiazide (ZESTORETIC) 20-25 MG tablet Take 1 tablet by mouth daily. 90 tablet 1   No facility-administered medications prior to visit.     ROS Review of Systems  Constitutional: Negative for activity change and appetite change.  HENT: Negative for sinus pressure and sore throat.   Eyes: Negative for visual disturbance.  Respiratory: Negative for cough, chest tightness and shortness of breath.   Cardiovascular: Negative for chest pain and leg swelling.  Gastrointestinal: Negative for abdominal distention, abdominal pain, constipation and diarrhea.  Endocrine: Negative.   Genitourinary: Negative for dysuria.  Musculoskeletal: Negative for joint swelling and myalgias.  Skin: Negative for rash.  Allergic/Immunologic: Negative.   Neurological: Negative for weakness, light-headedness and numbness.  Psychiatric/Behavioral: Negative for dysphoric mood and suicidal ideas.    Objective:  BP 129/72   Pulse 74   Ht 5\' 8"  (1.727 m)   Wt 121 lb (54.9 kg)   SpO2 100%   BMI 18.40 kg/m    BP/Weight 03/31/2020 10/01/2019 12/31/2692  Systolic BP 854 627 035  Diastolic BP 72 72 78  Wt. (Lbs) 121 119 -  BMI 18.4 18.09 -      Physical Exam Constitutional:      Appearance: He is well-developed.  Neck:     Vascular: No JVD.  Cardiovascular:     Rate and Rhythm: Normal rate.     Heart sounds: Normal heart sounds. No murmur.  Pulmonary:     Effort: Pulmonary effort is normal.     Breath sounds: Normal breath sounds. No wheezing or rales.  Chest:     Chest wall: No tenderness.  Abdominal:     General: Bowel sounds are normal. There is no distension.     Palpations: Abdomen is soft. There is no mass.     Tenderness: There is no abdominal tenderness.  Musculoskeletal:        General: Normal range of motion.     Right lower leg: No edema.     Left lower leg: No edema.  Neurological:     Mental Status: He is alert and oriented to person, place, and time.  Psychiatric:        Mood and Affect: Mood normal.     CMP Latest Ref Rng & Units 07/03/2019 02/23/2018 03/01/2017  Glucose 65 - 99 mg/dL 89 91 81  BUN 8 - 27 mg/dL 10 14 14   Creatinine 0.76 - 1.27 mg/dL 1.26 1.19 1.24  Sodium 134 - 144 mmol/L 140 140 139  Potassium 3.5 - 5.2 mmol/L 4.3 4.2 3.9  Chloride 96 - 106 mmol/L 100 100 101  CO2 20 - 29 mmol/L 22 24 27   Calcium 8.6 - 10.2 mg/dL 9.8 9.9 9.8  Total Protein 6.0 - 8.5 g/dL 7.4 7.4 7.4  Total Bilirubin 0.0 - 1.2 mg/dL 0.4 0.6 0.6  Alkaline Phos 39 - 117 IU/L 63 58 56  AST 0 - 40 IU/L 16 20 17   ALT 0 - 44 IU/L 10 11 8(L)    Lipid Panel     Component Value Date/Time   CHOL 156 07/03/2019 1105   TRIG 67 07/03/2019 1105   HDL 54 07/03/2019 1105   CHOLHDL 2.9 07/03/2019 1105   CHOLHDL 3.4 12/16/2014 1249   VLDL 13 12/16/2014 1249   LDLCALC 89 07/03/2019 1105    CBC    Component Value Date/Time   WBC 5.9 10/30/2015 2131   RBC 4.36 10/30/2015 2131   HGB 12.4 (L) 10/30/2015 2131   HCT 36.4 (L) 10/30/2015 2131   PLT 225 10/30/2015 2131   MCV 83.5  10/30/2015 2131   MCH 28.4 10/30/2015 2131   MCHC 34.1 10/30/2015 2131   RDW 13.6 10/30/2015 2131   LYMPHSABS 2.0 12/16/2014 1249   MONOABS 0.5 12/16/2014 1249   EOSABS 0.3 12/16/2014 1249   BASOSABS 0.1 12/16/2014 1249    Lab Results  Component Value Date   HGBA1C 5.5 12/16/2014    Assessment & Plan:    1. Essential hypertension Controlled Continue current regimen Counseled on blood pressure goal of less than 130/80, low-sodium, DASH diet, medication compliance, 150 minutes of moderate intensity exercise per week. Discussed medication compliance, adverse effects. - lisinopril-hydrochlorothiazide (ZESTORETIC) 20-25 MG tablet; Take 1 tablet by mouth daily.  Dispense: 90 tablet; Refill: 1 - Basic Metabolic Panel  2. Rhinosinusitis Advised on nasal irrigation We will commence Zyrtec Advised that if symptoms persist he should notify the clinic; will consider antibiotics at that time but for now symptoms can be managed symptomatically - cetirizine (ZYRTEC) 10 MG tablet; Take 1 tablet (10 mg total) by mouth daily.  Dispense: 30 tablet; Refill: 1   Return in about 6 months (around 09/30/2020) for Hypertension.  12/18/2014, MD, FAAFP. [facility] [place], [facility] [place]   03/31/2020, 2:06 PM

## 2020-04-01 LAB — BASIC METABOLIC PANEL
BUN/Creatinine Ratio: 8 — ABNORMAL LOW (ref 10–24)
BUN: 10 mg/dL (ref 8–27)
CO2: 28 mmol/L (ref 20–29)
Calcium: 9.7 mg/dL (ref 8.6–10.2)
Chloride: 104 mmol/L (ref 96–106)
Creatinine, Ser: 1.21 mg/dL (ref 0.76–1.27)
GFR calc Af Amer: 72 mL/min/{1.73_m2} (ref >59)
GFR calc non Af Amer: 62 mL/min/{1.73_m2} (ref >59)
Glucose: 67 mg/dL (ref 65–99)
Potassium: 3.7 mmol/L (ref 3.5–5.2)
Sodium: 143 mmol/L (ref 134–144)

## 2020-04-03 ENCOUNTER — Telehealth: Payer: Self-pay

## 2020-04-03 NOTE — Telephone Encounter (Signed)
Patient was called and a voicemail was left informing patient to return phone call for lab results. 

## 2020-04-03 NOTE — Telephone Encounter (Signed)
-----   Message from Hoy Register, MD sent at 04/01/2020  9:10 AM EDT ----- Labs are normal

## 2020-04-06 NOTE — Telephone Encounter (Signed)
Patient was called and a voicemail was left informing patient to return phone call for lab results. 

## 2020-06-29 ENCOUNTER — Other Ambulatory Visit: Payer: Self-pay | Admitting: Family Medicine

## 2020-06-29 DIAGNOSIS — I10 Essential (primary) hypertension: Secondary | ICD-10-CM

## 2020-06-30 NOTE — Telephone Encounter (Signed)
Requested Prescriptions  Pending Prescriptions Disp Refills   lisinopril-hydrochlorothiazide (ZESTORETIC) 20-25 MG tablet [Pharmacy Med Name: Lisinopril-hydroCHLOROthiazide 20-25 MG Oral Tablet] 90 tablet 0    Sig: Take 1 tablet by mouth once daily     Cardiovascular:  ACEI + Diuretic Combos Passed - 06/29/2020 10:58 AM      Passed - Na in normal range and within 180 days    Sodium  Date Value Ref Range Status  03/31/2020 143 134 - 144 mmol/L Final         Passed - K in normal range and within 180 days    Potassium  Date Value Ref Range Status  03/31/2020 3.7 3.5 - 5.2 mmol/L Final         Passed - Cr in normal range and within 180 days    Creat  Date Value Ref Range Status  03/01/2017 1.24 0.70 - 1.25 mg/dL Final    Comment:      For patients > or = 66 years of age: The upper reference limit for Creatinine is approximately 13% higher for people identified as African-American.      Creatinine, Ser  Date Value Ref Range Status  03/31/2020 1.21 0.76 - 1.27 mg/dL Final         Passed - Ca in normal range and within 180 days    Calcium  Date Value Ref Range Status  03/31/2020 9.7 8.6 - 10.2 mg/dL Final   Calcium, Ion  Date Value Ref Range Status  06/14/2010 1.01 (L) 1.12 - 1.32 mmol/L Final         Passed - Patient is not pregnant      Passed - Last BP in normal range    BP Readings from Last 1 Encounters:  03/31/20 129/72         Passed - Valid encounter within last 6 months    Recent Outpatient Visits          3 months ago Rhinosinusitis   [facility] [place], Odette Horns, MD   9 months ago Tobacco abuse   [facility] Hoy Register, MD   12 months ago Tobacco abuse   [facility] Hoy Register, MD   1 year ago Xerosis of skin   [facility] Debby Bud, MD   1 year ago Felon of finger of right hand   [facility] [place], [place], MD

## 2020-07-07 DIAGNOSIS — Z0000 Encounter for general adult medical examination without abnormal findings: Secondary | ICD-10-CM | POA: Diagnosis not present

## 2020-11-03 NOTE — H&P (Signed)
HISTORY AND PHYSICAL  Martin Mason is a 63 y.o. male patient with CC: painful teeth  No diagnosis found.  Past Medical History:  Diagnosis Date  . Hypertension     No current facility-administered medications for this encounter.   Current Outpatient Medications  Medication Sig Dispense Refill  . lisinopril-hydrochlorothiazide (ZESTORETIC) 20-25 MG tablet Take 1 tablet by mouth once daily 90 tablet 0  . cetirizine (ZYRTEC) 10 MG tablet Take 1 tablet (10 mg total) by mouth daily. (Patient not taking: Reported on 11/02/2020) 30 tablet 1   No Known Allergies Active Problems:   * No active hospital problems. *  Vitals: There were no vitals taken for this visit. Lab results:No results found for this or any previous visit (from the past 24 hour(s)). Radiology Results: No results found. General appearance: alert, cooperative and no distress Head: Normocephalic, without obvious abnormality, atraumatic Eyes: negative Nose: Nares normal. Septum midline. Mucosa normal. No drainage or sinus tenderness. Throat: Multiple carious teeth, generalized periodontitis, no purulence, fluctuance, edema, trismusl. Pharynx clear. Neck: no adenopathy and supple, symmetrical, trachea midline Resp: clear to auscultation bilaterally Cardio: regular rate and rhythm, S1, S2 normal, no murmur, click, rub or gallop  Assessment: All teeth non-restorable secondary to dental caries, periodontitis.  Plan: Full mouth extraction with alveoloplasty. GA, Day surgery.   Ocie Doyne 11/03/2020

## 2020-11-04 ENCOUNTER — Inpatient Hospital Stay (HOSPITAL_COMMUNITY): Admission: RE | Admit: 2020-11-04 | Payer: Medicare Other | Source: Ambulatory Visit

## 2020-11-05 ENCOUNTER — Encounter (HOSPITAL_COMMUNITY): Payer: Self-pay | Admitting: Oral Surgery

## 2020-11-05 ENCOUNTER — Other Ambulatory Visit: Payer: Self-pay

## 2020-11-05 NOTE — Progress Notes (Signed)
[name] denies chest pain or shortness of breath.  [name] reported that he has had 3 Covid vaccines and has not been exposed with anyone with Covid to his knowledge.  Patient will be tested on arrival, as he will be transported by a transportation Zenaida Niece, [name] brother will be with patient.

## 2020-11-06 ENCOUNTER — Ambulatory Visit (HOSPITAL_COMMUNITY): Payer: Medicare Other | Admitting: Certified Registered Nurse Anesthetist

## 2020-11-06 ENCOUNTER — Encounter (HOSPITAL_COMMUNITY)
Admission: RE | Disposition: A | Discharge status: Home / Self Care | Payer: Self-pay | Source: Home / Self Care | Attending: Oral Surgery

## 2020-11-06 ENCOUNTER — Other Ambulatory Visit: Payer: Self-pay

## 2020-11-06 ENCOUNTER — Ambulatory Visit (HOSPITAL_COMMUNITY)
Admission: RE | Admit: 2020-11-06 | Discharge: 2020-11-06 | Disposition: A | Discharge status: Home / Self Care | Payer: Medicare Other | Attending: Oral Surgery | Admitting: Oral Surgery

## 2020-11-06 ENCOUNTER — Encounter (HOSPITAL_COMMUNITY): Payer: Self-pay | Admitting: Oral Surgery

## 2020-11-06 DIAGNOSIS — K05323 Chronic periodontitis, generalized, severe: Secondary | ICD-10-CM | POA: Insufficient documentation

## 2020-11-06 DIAGNOSIS — M2607 Excessive tuberosity of jaw: Secondary | ICD-10-CM | POA: Insufficient documentation

## 2020-11-06 DIAGNOSIS — I10 Essential (primary) hypertension: Secondary | ICD-10-CM | POA: Insufficient documentation

## 2020-11-06 DIAGNOSIS — K029 Dental caries, unspecified: Secondary | ICD-10-CM | POA: Insufficient documentation

## 2020-11-06 DIAGNOSIS — F17200 Nicotine dependence, unspecified, uncomplicated: Secondary | ICD-10-CM | POA: Insufficient documentation

## 2020-11-06 DIAGNOSIS — Z79899 Other long term (current) drug therapy: Secondary | ICD-10-CM | POA: Insufficient documentation

## 2020-11-06 DIAGNOSIS — Z20822 Contact with and (suspected) exposure to covid-19: Secondary | ICD-10-CM | POA: Diagnosis not present

## 2020-11-06 HISTORY — PX: TOOTH EXTRACTION: SHX859

## 2020-11-06 HISTORY — DX: Unspecified asthma, uncomplicated: J45.909

## 2020-11-06 LAB — POCT I-STAT, CHEM 8
BUN: 11 mg/dL (ref 8–23)
Calcium, Ion: 1.2 mmol/L (ref 1.15–1.40)
Chloride: 103 mmol/L (ref 98–111)
Creatinine, Ser: 0.8 mg/dL (ref 0.61–1.24)
Glucose, Bld: 71 mg/dL (ref 70–99)
HCT: 35 % — ABNORMAL LOW (ref 39.0–52.0)
Hemoglobin: 11.9 g/dL — ABNORMAL LOW (ref 13.0–17.0)
Potassium: 4.3 mmol/L (ref 3.5–5.1)
Sodium: 138 mmol/L (ref 135–145)
TCO2: 20 mmol/L — ABNORMAL LOW (ref 22–32)

## 2020-11-06 LAB — SARS CORONAVIRUS 2 BY RT PCR (HOSPITAL ORDER, PERFORMED IN ~~LOC~~ HOSPITAL LAB): SARS Coronavirus 2: NEGATIVE

## 2020-11-06 SURGERY — DENTAL RESTORATION/EXTRACTIONS
Anesthesia: General

## 2020-11-06 MED ORDER — ORAL CARE MOUTH RINSE: 15.0000 mL | Freq: Once | OROMUCOSAL | Status: DC

## 2020-11-06 MED ORDER — AMOXICILLIN 500 MG PO CAPS: 500.0000 mg | ORAL_CAPSULE | Freq: Three times a day (TID) | ORAL | 0 refills | Status: DC

## 2020-11-06 MED ORDER — ACETAMINOPHEN 325 MG PO TABS
325.0000 mg | ORAL_TABLET | ORAL | Status: DC | PRN
  Administered 2020-11-06: 650 mg via ORAL

## 2020-11-06 MED ORDER — DEXAMETHASONE SODIUM PHOSPHATE 10 MG/ML IJ SOLN
INTRAMUSCULAR | Status: AC
  Filled 2020-11-06: qty 1

## 2020-11-06 MED ORDER — EPHEDRINE SULFATE-NACL 50-0.9 MG/10ML-% IV SOSY
PREFILLED_SYRINGE | INTRAVENOUS | Status: DC | PRN
  Administered 2020-11-06: 5 mg via INTRAVENOUS
  Administered 2020-11-06: 10 mg via INTRAVENOUS

## 2020-11-06 MED ORDER — PHENYLEPHRINE 40 MCG/ML (10ML) SYRINGE FOR IV PUSH (FOR BLOOD PRESSURE SUPPORT)
PREFILLED_SYRINGE | INTRAVENOUS | Status: AC
  Filled 2020-11-06: qty 20

## 2020-11-06 MED ORDER — FENTANYL CITRATE (PF) 100 MCG/2ML IJ SOLN: 25.0000 ug | INTRAMUSCULAR | Status: DC | PRN

## 2020-11-06 MED ORDER — EPHEDRINE 5 MG/ML INJ
INTRAVENOUS | Status: AC
  Filled 2020-11-06: qty 10

## 2020-11-06 MED ORDER — LIDOCAINE 2% (20 MG/ML) 5 ML SYRINGE
INTRAMUSCULAR | Status: AC
  Filled 2020-11-06: qty 5

## 2020-11-06 MED ORDER — FENTANYL CITRATE (PF) 250 MCG/5ML IJ SOLN
INTRAMUSCULAR | Status: DC | PRN
  Administered 2020-11-06 (×2): 50 ug via INTRAVENOUS

## 2020-11-06 MED ORDER — PROPOFOL 10 MG/ML IV BOLUS
INTRAVENOUS | Status: AC
  Filled 2020-11-06: qty 20

## 2020-11-06 MED ORDER — ONDANSETRON HCL 4 MG/2ML IJ SOLN
INTRAMUSCULAR | Status: AC
  Filled 2020-11-06: qty 2

## 2020-11-06 MED ORDER — OXYMETAZOLINE HCL 0.05 % NA SOLN
NASAL | Status: DC | PRN
  Administered 2020-11-06: 3 via NASAL

## 2020-11-06 MED ORDER — CEFAZOLIN SODIUM-DEXTROSE 2-4 GM/100ML-% IV SOLN
2.0000 g | INTRAVENOUS | Status: AC
  Administered 2020-11-06: 2 g via INTRAVENOUS
  Filled 2020-11-06: qty 100

## 2020-11-06 MED ORDER — ACETAMINOPHEN 160 MG/5ML PO SOLN: 325.0000 mg | ORAL | Status: DC | PRN

## 2020-11-06 MED ORDER — PHENYLEPHRINE HCL-NACL 10-0.9 MG/250ML-% IV SOLN
INTRAVENOUS | Status: DC | PRN
  Administered 2020-11-06: 60 ug/min via INTRAVENOUS

## 2020-11-06 MED ORDER — SUGAMMADEX SODIUM 200 MG/2ML IV SOLN
INTRAVENOUS | Status: DC | PRN
  Administered 2020-11-06: 200 mg via INTRAVENOUS

## 2020-11-06 MED ORDER — ROCURONIUM BROMIDE 10 MG/ML (PF) SYRINGE
PREFILLED_SYRINGE | INTRAVENOUS | Status: AC
  Filled 2020-11-06: qty 10

## 2020-11-06 MED ORDER — OXYCODONE HCL 5 MG PO TABS: 5.0000 mg | ORAL_TABLET | Freq: Once | ORAL | Status: DC | PRN

## 2020-11-06 MED ORDER — ACETAMINOPHEN 325 MG PO TABS
ORAL_TABLET | ORAL | Status: AC
  Filled 2020-11-06: qty 2

## 2020-11-06 MED ORDER — ALBUMIN HUMAN 5 % IV SOLN: INTRAVENOUS | Status: DC | PRN

## 2020-11-06 MED ORDER — LACTATED RINGERS IV SOLN: INTRAVENOUS | Status: DC

## 2020-11-06 MED ORDER — ROCURONIUM BROMIDE 100 MG/10ML IV SOLN
INTRAVENOUS | Status: DC | PRN
  Administered 2020-11-06: 50 mg via INTRAVENOUS

## 2020-11-06 MED ORDER — MEPERIDINE HCL 25 MG/ML IJ SOLN: 6.2500 mg | INTRAMUSCULAR | Status: DC | PRN

## 2020-11-06 MED ORDER — CHLORHEXIDINE GLUCONATE 0.12 % MT SOLN
15.0000 mL | Freq: Once | OROMUCOSAL | Status: DC
  Filled 2020-11-06: qty 15

## 2020-11-06 MED ORDER — SODIUM CHLORIDE 0.9 % IR SOLN
Status: DC | PRN
  Administered 2020-11-06: 100 mL

## 2020-11-06 MED ORDER — OXYCODONE HCL 5 MG/5ML PO SOLN: 5.0000 mg | Freq: Once | ORAL | Status: DC | PRN

## 2020-11-06 MED ORDER — PHENYLEPHRINE 40 MCG/ML (10ML) SYRINGE FOR IV PUSH (FOR BLOOD PRESSURE SUPPORT)
PREFILLED_SYRINGE | INTRAVENOUS | Status: DC | PRN
  Administered 2020-11-06 (×2): 40 ug via INTRAVENOUS
  Administered 2020-11-06: 80 ug via INTRAVENOUS

## 2020-11-06 MED ORDER — OXYCODONE-ACETAMINOPHEN 5-325 MG PO TABS
1.0000 | ORAL_TABLET | ORAL | 0 refills | Status: DC | PRN
Start: 2020-11-06 — End: 2022-01-25

## 2020-11-06 MED ORDER — DEXAMETHASONE SODIUM PHOSPHATE 10 MG/ML IJ SOLN
INTRAMUSCULAR | Status: DC | PRN
  Administered 2020-11-06: 5 mg via INTRAVENOUS

## 2020-11-06 MED ORDER — ONDANSETRON HCL 4 MG/2ML IJ SOLN: 4.0000 mg | Freq: Once | INTRAMUSCULAR | Status: DC | PRN

## 2020-11-06 MED ORDER — FENTANYL CITRATE (PF) 250 MCG/5ML IJ SOLN
INTRAMUSCULAR | Status: AC
  Filled 2020-11-06: qty 5

## 2020-11-06 MED ORDER — LIDOCAINE-EPINEPHRINE 2 %-1:100000 IJ SOLN
INTRAMUSCULAR | Status: DC | PRN
  Administered 2020-11-06: 18 mL via INTRADERMAL

## 2020-11-06 MED ORDER — PROPOFOL 10 MG/ML IV BOLUS
INTRAVENOUS | Status: DC | PRN
  Administered 2020-11-06: 30 mg via INTRAVENOUS
  Administered 2020-11-06: 100 mg via INTRAVENOUS

## 2020-11-06 MED ORDER — ONDANSETRON HCL 4 MG/2ML IJ SOLN
INTRAMUSCULAR | Status: DC | PRN
  Administered 2020-11-06: 4 mg via INTRAVENOUS

## 2020-11-06 MED ORDER — LIDOCAINE-EPINEPHRINE 2 %-1:100000 IJ SOLN
INTRAMUSCULAR | Status: AC
  Filled 2020-11-06: qty 1

## 2020-11-06 SURGICAL SUPPLY — 29 items
BLADE SURG 15 STRL LF DISP TIS (BLADE) ×1 IMPLANT
BLADE SURG 15 STRL SS (BLADE) ×3
BUR CROSS CUT FISSURE 1.6 (BURR) ×2 IMPLANT
BUR CROSS CUT FISSURE 1.6MM (BURR) ×1
BUR EGG ELITE 4.0 (BURR) ×1 IMPLANT
BUR EGG ELITE 4.0MM (BURR)
CANISTER SUCT 3000ML PPV (MISCELLANEOUS) ×3 IMPLANT
COVER SURGICAL LIGHT HANDLE (MISCELLANEOUS) ×3 IMPLANT
GAUZE PACKING FOLDED 2  STR (GAUZE/BANDAGES/DRESSINGS) ×3
GAUZE PACKING FOLDED 2 STR (GAUZE/BANDAGES/DRESSINGS) ×1 IMPLANT
GLOVE BIO SURGEON STRL SZ8 (GLOVE) ×3 IMPLANT
GOWN STRL REUS W/ TWL LRG LVL3 (GOWN DISPOSABLE) ×1 IMPLANT
GOWN STRL REUS W/ TWL XL LVL3 (GOWN DISPOSABLE) ×1 IMPLANT
GOWN STRL REUS W/TWL LRG LVL3 (GOWN DISPOSABLE) ×3
GOWN STRL REUS W/TWL XL LVL3 (GOWN DISPOSABLE) ×3
IV NS 1000ML (IV SOLUTION) ×3
IV NS 1000ML BAXH (IV SOLUTION) ×1 IMPLANT
KIT BASIN OR (CUSTOM PROCEDURE TRAY) ×3 IMPLANT
KIT TURNOVER KIT B (KITS) ×3 IMPLANT
NDL HYPO 25GX1X1/2 BEV (NEEDLE) ×2 IMPLANT
NEEDLE HYPO 25GX1X1/2 BEV (NEEDLE) ×6 IMPLANT
NS IRRIG 1000ML POUR BTL (IV SOLUTION) ×3 IMPLANT
PAD ARMBOARD 7.5X6 YLW CONV (MISCELLANEOUS) ×3 IMPLANT
SLEEVE IRRIGATION ELITE 7 (MISCELLANEOUS) ×3 IMPLANT
SUT CHROMIC 3 0 PS 2 (SUTURE) ×5 IMPLANT
SYR CONTROL 10ML LL (SYRINGE) ×3 IMPLANT
TRAY ENT MC OR (CUSTOM PROCEDURE TRAY) ×3 IMPLANT
TUBING IRRIGATION (MISCELLANEOUS) ×3 IMPLANT
YANKAUER SUCT BULB TIP NO VENT (SUCTIONS) ×3 IMPLANT

## 2020-11-06 NOTE — Transfer of Care (Signed)
Immediate Anesthesia Transfer of Care Note  Patient: Martin Mason  Procedure(s) Performed: DENTAL RESTORATION/EXTRACTIONS (N/A )  Patient Location: PACU  Anesthesia Type:General  Level of Consciousness: awake and patient cooperative  Airway & Oxygen Therapy: Patient Spontanous Breathing and Patient connected to face mask oxygen  Post-op Assessment: Report given to RN and Post -op Vital signs reviewed and stable  Post vital signs: Reviewed  Last Vitals:  Vitals Value Taken Time  BP 148/92 11/06/20 1124  Temp    Pulse 88 11/06/20 1127  Resp 9 11/06/20 1127  SpO2 100 % 11/06/20 1127  Vitals shown include unvalidated device data.  Last Pain:  Vitals:   11/06/20 0804  TempSrc: Oral         Complications: No complications documented.

## 2020-11-06 NOTE — H&P (Signed)
H&P documentation  -History and Physical Reviewed  -Patient has been re-examined  -No change in the plan of care  Martin Mason  

## 2020-11-06 NOTE — Anesthesia Preprocedure Evaluation (Addendum)
Anesthesia Evaluation  Patient identified by MRN, date of birth, ID band Patient awake    Reviewed: Allergy & Precautions, H&P , NPO status , Patient's Chart, lab work & pertinent test results, reviewed documented beta blocker date and time   Airway Mallampati: II  TM Distance: >3 FB Neck ROM: full    Dental no notable dental hx. (+) Poor Dentition, Chipped, Missing, Loose   Pulmonary neg pulmonary ROS, Current Smoker and Patient abstained from smoking.,    Pulmonary exam normal breath sounds clear to auscultation       Cardiovascular Exercise Tolerance: Good hypertension, negative cardio ROS   Rhythm:regular Rate:Normal     Neuro/Psych negative neurological ROS  negative psych ROS   GI/Hepatic negative GI ROS, (+)     substance abuse  alcohol use,   Endo/Other  negative endocrine ROS  Renal/GU negative Renal ROS  negative genitourinary   Musculoskeletal   Abdominal   Peds  Hematology negative hematology ROS (+)   Anesthesia Other Findings   Reproductive/Obstetrics negative OB ROS                            Anesthesia Physical Anesthesia Plan  ASA: III  Anesthesia Plan: General   Post-op Pain Management:    Induction:   PONV Risk Score and Plan: Ondansetron and Treatment may vary due to age or medical condition  Airway Management Planned: Oral ETT, Nasal ETT and Video Laryngoscope Planned  Additional Equipment:   Intra-op Plan:   Post-operative Plan:   Informed Consent: I have reviewed the patients History and Physical, chart, labs and discussed the procedure including the risks, benefits and alternatives for the proposed anesthesia with the patient or authorized representative who has indicated his/her understanding and acceptance.     Dental Advisory Given  Plan Discussed with: CRNA and Anesthesiologist  Anesthesia Plan Comments:         Anesthesia Quick  Evaluation

## 2020-11-06 NOTE — Op Note (Signed)
11/06/2020  11:11 AM  PATIENT:  Martin Mason  64 y.o. male  PRE-OPERATIVE DIAGNOSIS:  NON-RESTORABLE TEETH #3, 4, 5, 6, 7, 8, 9, 10, 11, 12, 13, 14, 15, 16, 17, 18, 19, 20, 21, 22, 27, 28, 32 SECONDARY TO DENTAL CARIES AND SEVERE GENERALIZED PERIODONTITIS  POST-OPERATIVE DIAGNOSIS:  SAME+  HYPERPLASTIC LEFT MAXILLARY TUBEROSITY    PROCEDURE:  Procedure(s): EXTRACTION TEETH #3, 4, 5, 6, 7, 8, 9, 10, 11, 12, 13, 14, 15, 16, 17, 18, 19, 20, 21, 22, 27, 28, 32, ALVEOLOPLASTY RIGHT AND LEFT MAXILLA AND MANDIBLE, REMOVAL HYPERPLASTIC LEFT MAXILLARY TUBEROSITY  SURGEON:  Surgeon(s): Ocie Doyne, DDS  ANESTHESIA:   local and general  EBL:  minimal  DRAINS: none   SPECIMEN:  No Specimen  COUNTS:  YES  PLAN OF CARE: Discharge to home after PACU  PATIENT DISPOSITION:  PACU - hemodynamically stable.   PROCEDURE DETAILS: Dictation #[id number]  Georgia Lopes, DMD 11/06/2020 11:11 AM

## 2020-11-06 NOTE — Anesthesia Procedure Notes (Signed)
Procedure Name: Intubation Date/Time: 11/06/2020 10:01 AM Performed by: Audie Pinto, CRNA Pre-anesthesia Checklist: Patient identified, Emergency Drugs available, Suction available and Patient being monitored Patient Re-evaluated:Patient Re-evaluated prior to induction Oxygen Delivery Method: Circle system utilized Preoxygenation: Pre-oxygenation with 100% oxygen Induction Type: IV induction Ventilation: Mask ventilation without difficulty Laryngoscope Size: Glidescope and 3 Grade View: Grade I Nasal Tubes: Nasal prep performed and Nasal Rae Tube size: 6.5 mm Number of attempts: 1 Placement Confirmation: ETT inserted through vocal cords under direct vision,  positive ETCO2 and breath sounds checked- equal and bilateral Tube secured with: Tape Dental Injury: Teeth and Oropharynx as per pre-operative assessment

## 2020-11-06 NOTE — Anesthesia Postprocedure Evaluation (Signed)
Anesthesia Post Note  Patient: Martin Mason  Procedure(s) Performed: DENTAL RESTORATION/EXTRACTIONS (N/A )     Patient location during evaluation: PACU Anesthesia Type: General Level of consciousness: awake and alert Pain management: pain level controlled Vital Signs Assessment: post-procedure vital signs reviewed and stable Respiratory status: spontaneous breathing, nonlabored ventilation, respiratory function stable and patient connected to nasal cannula oxygen Cardiovascular status: blood pressure returned to baseline and stable Postop Assessment: no apparent nausea or vomiting Anesthetic complications: no   No complications documented.  Last Vitals:  Vitals:   11/06/20 1140 11/06/20 1155  BP: (!) 150/75 (!) 148/82  Pulse: 84 78  Resp: 19 18  Temp:  (!) 36.4 C  SpO2: 99% 96%    Last Pain:  Vitals:   11/06/20 1155  TempSrc:   PainSc: 0-No pain                 Kushi Kun

## 2020-11-07 ENCOUNTER — Encounter (HOSPITAL_COMMUNITY): Payer: Self-pay | Admitting: Oral Surgery

## 2020-11-07 NOTE — Op Note (Signed)
[employer] MEDICAL RECORD [id number] ACCOUNT [id number] DATE OF BIRTH:09-27-1956 FACILITY: [facility] LOCATION: [facility] PHYSICIAN:Wister Hoefle M. Javeah Loeza, DDS  OPERATIVE REPORT  DATE OF PROCEDURE:  11/06/2020  PREOPERATIVE DIAGNOSIS:  Nonrestorable teeth secondary to dental caries and severe generalized periodontitis numbers 3, 4, 5, 6, 7, 8, 9, 10, 11, 12, 13, 14, 15, 16, 17, 18, 19, 20, 21, 22, 27, 28, 32.  POSTOPERATIVE DIAGNOSIS:  Nonrestorable teeth secondary to dental caries and severe generalized periodontitis numbers 3, 4, 5, 6, 7, 8, 9, 10, 11, 12, 13, 14, 15, 16, 17, 18, 19, 20, 21, 22, 27, 28, 32, hyperplastic left maxillary tuberosity.  PROCEDURE:  Extraction of teeth numbers 3, 4, 5, 6, 7, 8, 9, 10, 11, 12, 13, 14, 15, 16, 17, 18, 19, 20, 21, 22, 27, 28, 32, alveoplasty right and left maxilla and mandible, removal of hyperplastic left maxillary tuberosity.  SURGEON:  Ocie Doyne, DDS  ANESTHESIA:  General, nasal intubation, Dr. Tacy Dura, attending.  DESCRIPTION OF PROCEDURE:  The patient was taken to the operating room and placed on the table in supine position.  General anesthesia was administered intravenously and a nasal endotracheal tube was placed and secured.  The eyes were protected and the  patient was draped for surgery.  A timeout was performed.  The posterior pharynx was suctioned and a throat pack was placed, 2% lidocaine 1:100,000 epinephrine was infiltrated in an inferior alveolar block on the right and left sides and in buccal and  palatal infiltration in the maxilla.  A bite block was placed on the right side of the mouth and a sweetheart retractor was used to retract the tongue.  A #15 blade used to make incision starting in the mandible at tooth #17 carried forward in the buccal  and lingual gingival sulcus until tooth #22 was encountered and then an incision was created anteriorly from tooth #22 to the midline of the mandibular alveolar crest.  The periosteum was  reflected from around these teeth.  The teeth were elevated with  a 301 elevator and removed with the dental forceps.  Tissue was trimmed and then the sockets were curetted.  The periosteum was reflected to expose the alveolar crest, which was grossly irregular in contour due to severe periodontitis.  Alveoplasty was  performed using an egg bur followed by the bone file.  Then, the area was irrigated and closed with 3-0 chromic.  In the left maxilla gross tartar was debrided with a rongeur and periosteal elevator and then an incision was made around teeth numbers 16,  15, 14, 13, 12, 11, 10, 9, 8, 7.  The periosteum was reflected from around these teeth and the teeth were elevated and removed with the dental forceps.  The palatal tissue was hyperplastic and redundant, causing an undercut in the palatal at the alveolar  ridge based on the left side.  After the teeth were extracted and the sockets curetted, the tissue was trimmed and a portion of the palatal tissue was resected with a 15 blade by making an incision at the tuberosity region, carried forward approximately  1 cm below the edge of the original sulcular incision on the palatal side, forward to the proximate area of the premolars and taking out a wedge of fibers of the palatal tissue as well as portion of the mucosa so that the area could be reapproximated  with good contour and primary closure.  Alveoplasty was performed using the egg bur under irrigation followed by the bone file and then  the left maxilla was sutured with 3-0 chromic interrupted and continuous interlocking sutures.  Then, the bite block  was placed on the other side of the mouth.  Attention was turned to the right mandible.  A 15 blade used to make an incision overlying tooth #32 in an elliptical fashion.  The tooth was then elevated with a 301 elevator and removed, the socket was  curetted.  Then, another incision was made around teeth numbers 27 and 28 with posterior and  anterior extensions on the alveolar ridge for access and closure.  The periosteum was reflected.  The teeth were elevated and removed with the Ash forceps.   Then, the sockets were curetted.  The alveoplasty was performed to recontour the bone so that it was in similar width as the edentulous portion of the right mandible.  The egg bur was used for this followed by the bone file.  Then, the right mandible was  closed with 3-0 chromic.  Then, the 15 blade used to make an incision around teeth numbers 3, 4, 5 and 6 in the maxilla.  The periosteum was reflected and the teeth were removed with the dental forceps.  The sockets were curetted.  The periosteum was  reflected and alveoplasty was performed using the egg bur and the bone file.  Then, this area was irrigated and closed with 3-0 chromic.  The oral cavity was then irrigated and suctioned and a throat pack was removed.  The patient was left in care of  anesthesia for extubation and transport to recovery room.  Plans for discharge home through day surgery.  ESTIMATED BLOOD LOSS:  Minimal.  COMPLICATIONS:  None.  HN/NUANCE  D:11/06/2020 T:11/07/2020 JOB:[id number]

## 2020-11-09 LAB — POCT I-STAT, CHEM 8
BUN: 19 mg/dL (ref 8–23)
Calcium, Ion: 1.05 mmol/L — ABNORMAL LOW (ref 1.15–1.40)
Chloride: 106 mmol/L (ref 98–111)
Creatinine, Ser: 1.2 mg/dL (ref 0.61–1.24)
Glucose, Bld: 84 mg/dL (ref 70–99)
HCT: 47 % (ref 39.0–52.0)
Hemoglobin: 16 g/dL (ref 13.0–17.0)
Potassium: 6.5 mmol/L (ref 3.5–5.1)
Sodium: 138 mmol/L (ref 135–145)
TCO2: 24 mmol/L (ref 22–32)

## 2020-11-10 ENCOUNTER — Telehealth: Payer: Self-pay

## 2020-11-10 ENCOUNTER — Other Ambulatory Visit: Payer: Self-pay | Admitting: Family Medicine

## 2020-11-10 DIAGNOSIS — E875 Hyperkalemia: Secondary | ICD-10-CM

## 2020-11-10 NOTE — Telephone Encounter (Signed)
-----   Message from Hoy Register, MD sent at 11/10/2020  8:50 AM EST ----- Schedule labs for him as his most recent blood work during hospitalization revealed severely elevated potassium due to a problem with the specimen.

## 2020-11-10 NOTE — Telephone Encounter (Signed)
Patient was called and informed of potassium levels and lab appointment.

## 2020-11-12 ENCOUNTER — Other Ambulatory Visit: Payer: Self-pay

## 2020-11-12 ENCOUNTER — Ambulatory Visit: Payer: Medicare Other | Attending: Family Medicine

## 2020-11-12 DIAGNOSIS — Z23 Encounter for immunization: Secondary | ICD-10-CM | POA: Diagnosis not present

## 2020-11-12 DIAGNOSIS — E875 Hyperkalemia: Secondary | ICD-10-CM

## 2020-11-13 ENCOUNTER — Telehealth: Payer: Self-pay

## 2020-11-13 LAB — BASIC METABOLIC PANEL
BUN/Creatinine Ratio: 5 — ABNORMAL LOW (ref 10–24)
BUN: 6 mg/dL — ABNORMAL LOW (ref 8–27)
CO2: 24 mmol/L (ref 20–29)
Calcium: 9.5 mg/dL (ref 8.6–10.2)
Chloride: 104 mmol/L (ref 96–106)
Creatinine, Ser: 1.11 mg/dL (ref 0.76–1.27)
GFR calc Af Amer: 80 mL/min/{1.73_m2} (ref >59)
GFR calc non Af Amer: 69 mL/min/{1.73_m2} (ref >59)
Glucose: 82 mg/dL (ref 65–99)
Potassium: 4.4 mmol/L (ref 3.5–5.2)
Sodium: 141 mmol/L (ref 134–144)

## 2020-11-13 NOTE — Telephone Encounter (Signed)
-----   Message from Hoy Register, MD sent at 11/13/2020  2:10 PM EST ----- Please inform the patient that labs are normal. Thank you.

## 2020-11-13 NOTE — Telephone Encounter (Signed)
Patient was called and a voicemail was left informing patient to return phone call for lab results. 

## 2021-03-30 ENCOUNTER — Other Ambulatory Visit: Payer: Self-pay | Admitting: Family Medicine

## 2021-03-30 DIAGNOSIS — I10 Essential (primary) hypertension: Secondary | ICD-10-CM

## 2021-03-30 NOTE — Telephone Encounter (Signed)
  Notes to clinic: Patient has appointment on 04/28/2021 Review for refill until this appointment    Requested Prescriptions  Pending Prescriptions Disp Refills   lisinopril-hydrochlorothiazide (ZESTORETIC) 20-25 MG tablet [Pharmacy Med Name: Lisinopril-hydroCHLOROthiazide 20-25 MG Oral Tablet] 90 tablet 0    Sig: Take 1 tablet by mouth once daily      Cardiovascular:  ACEI + Diuretic Combos Failed - 03/30/2021 10:24 AM      Failed - Last BP in normal range    BP Readings from Last 1 Encounters:  11/06/20 (!) 144/90          Failed - Valid encounter within last 6 months    Recent Outpatient Visits           12 months ago Rhinosinusitis   [facility] [place], Odette Horns, MD   1 year ago Tobacco abuse   [facility] [place], [place], MD   1 year ago Tobacco abuse   [facility] [place], [place], MD   2 years ago Xerosis of skin   [facility] [place], [place], MD   2 years ago Felon of finger of right hand   [facility] [place], [place], MD       Future Appointments             In 4 weeks Hoy Register, MD [facility]             Passed - Na in normal range and within 180 days    Sodium  Date Value Ref Range Status  11/12/2020 141 134 - 144 mmol/L Final          Passed - K in normal range and within 180 days    Potassium  Date Value Ref Range Status  11/12/2020 4.4 3.5 - 5.2 mmol/L Final          Passed - Cr in normal range and within 180 days    Creat  Date Value Ref Range Status  03/01/2017 1.24 0.70 - 1.25 mg/dL Final    Comment:      For patients > or = 67 years of age: The upper reference limit for Creatinine is approximately 13% higher for people identified as African-American.      Creatinine, Ser  Date Value Ref Range Status  11/12/2020 1.11 0.76 - 1.27 mg/dL Final           Passed - Ca in normal range and within 180 days    Calcium  Date Value Ref Range Status  11/12/2020 9.5 8.6 - 10.2 mg/dL Final   Calcium, Ion  Date Value Ref Range Status  11/06/2020 1.20 1.15 - 1.40 mmol/L Final          Passed - Patient is not pregnant

## 2021-03-31 ENCOUNTER — Other Ambulatory Visit: Payer: Self-pay | Admitting: Family Medicine

## 2021-03-31 DIAGNOSIS — I10 Essential (primary) hypertension: Secondary | ICD-10-CM

## 2021-04-28 ENCOUNTER — Other Ambulatory Visit: Payer: Self-pay

## 2021-04-28 ENCOUNTER — Ambulatory Visit: Payer: Medicare (Managed Care) | Attending: Family Medicine | Admitting: Family Medicine

## 2021-04-28 ENCOUNTER — Encounter: Payer: Self-pay | Admitting: Family Medicine

## 2021-04-28 VITALS — BP 153/57 | HR 97 | Ht 68.5 in | Wt 116.0 lb

## 2021-04-28 DIAGNOSIS — I10 Essential (primary) hypertension: Secondary | ICD-10-CM

## 2021-04-28 DIAGNOSIS — N528 Other male erectile dysfunction: Secondary | ICD-10-CM | POA: Diagnosis not present

## 2021-04-28 DIAGNOSIS — Z1211 Encounter for screening for malignant neoplasm of colon: Secondary | ICD-10-CM

## 2021-04-28 MED ORDER — SILDENAFIL CITRATE 50 MG PO TABS: 50.0000 mg | ORAL_TABLET | Freq: Every day | ORAL | 1 refills | Status: DC | PRN

## 2021-04-28 MED ORDER — LISINOPRIL-HYDROCHLOROTHIAZIDE 20-25 MG PO TABS: 1.0000 | ORAL_TABLET | Freq: Every day | ORAL | 1 refills | Status: DC

## 2021-04-28 NOTE — Patient Instructions (Signed)

## 2021-04-28 NOTE — Progress Notes (Signed)
States that he can not smell.

## 2021-04-28 NOTE — Progress Notes (Signed)
Subjective:  Patient ID: Martin Mason, male    DOB: October 22, 1956  Age: 65 y.o. MRN: [id number]  CC: Hypertension   HPI Martin Mason  is a65 year old with a history of hypertension, tobacco abuse who presents today for follow-up visit. He is compliant with his antihypertensive and has no adverse effects from his medications. He is due for colonoscopy.  I had referred him for a colonoscopy in 08/2017 but it appears referral was closed. Compliant with his antihypertensive and his blood pressure is elevated today.  It was 144/90 at his last visit. He is requesting medication for erectile dysfunction.  Past Medical History:  Diagnosis Date  . Asthma    childhood- young child to age 36  . Hypertension     Past Surgical History:  Procedure Laterality Date  . COLONOSCOPY    . CYST EXCISION     throat  . TOOTH EXTRACTION N/A 11/06/2020   Procedure: DENTAL RESTORATION/EXTRACTIONS;  Surgeon: Ocie Doyne, DDS;  Location: [facility] OR;  Service: Oral Surgery;  Laterality: N/A;    Family History  Problem Relation Age of Onset  . Hypertension Mother   . Cancer Mother   . Hypertension Father   . Diabetes Sister     No Known Allergies  Outpatient Medications Prior to Visit  Medication Sig Dispense Refill  . lisinopril-hydrochlorothiazide (ZESTORETIC) 20-25 MG tablet Take 1 tablet by mouth once daily 30 tablet 0  . amoxicillin (AMOXIL) 500 MG capsule Take 1 capsule (500 mg total) by mouth 3 (three) times daily. (Patient not taking: Reported on 04/28/2021) 21 capsule 0  . cetirizine (ZYRTEC) 10 MG tablet Take 1 tablet (10 mg total) by mouth daily. (Patient not taking: Reported on 04/28/2021) 30 tablet 1  . oxyCODONE-acetaminophen (PERCOCET) 5-325 MG tablet Take 1 tablet by mouth every 4 (four) hours as needed. (Patient not taking: Reported on 04/28/2021) 30 tablet 0   No facility-administered medications prior to visit.     ROS Review of Systems  Constitutional: Negative for activity change and  appetite change.  HENT: Negative for sinus pressure and sore throat.   Eyes: Negative for visual disturbance.  Respiratory: Negative for cough, chest tightness and shortness of breath.   Cardiovascular: Negative for chest pain and leg swelling.  Gastrointestinal: Negative for abdominal distention, abdominal pain, constipation and diarrhea.  Endocrine: Negative.   Genitourinary: Negative for dysuria.  Musculoskeletal: Negative for joint swelling and myalgias.  Skin: Negative for rash.  Allergic/Immunologic: Negative.   Neurological: Negative for weakness, light-headedness and numbness.  Psychiatric/Behavioral: Negative for dysphoric mood and suicidal ideas.    Objective:  BP (!) 153/57   Pulse 97   Ht 5' 8.5" (1.74 m)   Wt 116 lb (52.6 kg)   SpO2 100%   BMI 17.38 kg/m   BP/Weight 04/28/2021 11/06/2020 03/31/2020  Systolic BP 153 144 129  Diastolic BP 57 90 72  Wt. (Lbs) 116 139.99 121  BMI 17.38 20.98 18.4      Physical Exam Constitutional:      Appearance: He is well-developed.  Neck:     Vascular: No JVD.  Cardiovascular:     Rate and Rhythm: Normal rate.     Heart sounds: Normal heart sounds. No murmur heard.   Pulmonary:     Effort: Pulmonary effort is normal.     Breath sounds: Normal breath sounds. No wheezing or rales.  Chest:     Chest wall: No tenderness.  Abdominal:     General: Bowel sounds are normal. There  is no distension.     Palpations: Abdomen is soft. There is no mass.     Tenderness: There is no abdominal tenderness.  Musculoskeletal:        General: Normal range of motion.     Right lower leg: No edema.     Left lower leg: No edema.  Neurological:     Mental Status: He is alert and oriented to person, place, and time.  Psychiatric:        Mood and Affect: Mood normal.     CMP Latest Ref Rng & Units 04/28/2021 11/12/2020 11/06/2020  Glucose 65 - 99 mg/dL 84 82 71  BUN 8 - 27 mg/dL 13 6(L) 11  Creatinine 0.76 - 1.27 mg/dL 3.53 6.14 4.31   Sodium 134 - 144 mmol/L 145(H) 141 138  Potassium 3.5 - 5.2 mmol/L 3.7 4.4 4.3  Chloride 96 - 106 mmol/L 106 104 103  CO2 20 - 29 mmol/L 24 24 -  Calcium 8.6 - 10.2 mg/dL 9.8 9.5 -  Total Protein 6.0 - 8.5 g/dL - - -  Total Bilirubin 0.0 - 1.2 mg/dL - - -  Alkaline Phos 39 - 117 IU/L - - -  AST 0 - 40 IU/L - - -  ALT 0 - 44 IU/L - - -    Lipid Panel     Component Value Date/Time   CHOL 156 07/03/2019 1105   TRIG 67 07/03/2019 1105   HDL 54 07/03/2019 1105   CHOLHDL 2.9 07/03/2019 1105   CHOLHDL 3.4 12/16/2014 1249   VLDL 13 12/16/2014 1249   LDLCALC 89 07/03/2019 1105    CBC    Component Value Date/Time   WBC 5.9 10/30/2015 2131   RBC 4.36 10/30/2015 2131   HGB 11.9 (L) 11/06/2020 0940   HCT 35.0 (L) 11/06/2020 0940   PLT 225 10/30/2015 2131   MCV 83.5 10/30/2015 2131   MCH 28.4 10/30/2015 2131   MCHC 34.1 10/30/2015 2131   RDW 13.6 10/30/2015 2131   LYMPHSABS 2.0 12/16/2014 1249   MONOABS 0.5 12/16/2014 1249   EOSABS 0.3 12/16/2014 1249   BASOSABS 0.1 12/16/2014 1249    Lab Results  Component Value Date   HGBA1C 5.5 12/16/2014    Assessment & Plan:  1. Essential hypertension Uncontrolled Amlodipine added to regimen Counseled on blood pressure goal of less than 130/80, low-sodium, DASH diet, medication compliance, 150 minutes of moderate intensity exercise per week. Discussed medication compliance, adverse effects. - Basic Metabolic Panel - lisinopril-hydrochlorothiazide (ZESTORETIC) 20-25 MG tablet; Take 1 tablet by mouth daily.  Dispense: 90 tablet; Refill: 1 - amLODipine (NORVASC) 2.5 MG tablet; Take 1 tablet (2.5 mg total) by mouth daily.  Dispense: 30 tablet; Refill: 6  2. Screening for colon cancer Referred in the past; I will refer again - Ambulatory referral to Gastroenterology  3. Other male erectile dysfunction - sildenafil (VIAGRA) 50 MG tablet; Take 1 tablet (50 mg total) by mouth daily as needed for erectile dysfunction. At least 24 hours  between doses  Dispense: 10 tablet; Refill: 1    Meds ordered this encounter  Medications  . lisinopril-hydrochlorothiazide (ZESTORETIC) 20-25 MG tablet    Sig: Take 1 tablet by mouth daily.    Dispense:  90 tablet    Refill:  1  . sildenafil (VIAGRA) 50 MG tablet    Sig: Take 1 tablet (50 mg total) by mouth daily as needed for erectile dysfunction. At least 24 hours between doses    Dispense:  10  tablet    Refill:  1    Follow-up: Return in about 6 months (around 10/29/2021) for Chronic disease management.       Hoy Register, MD, FAAFP. [facility] [place], [place] [phone]   04/29/2021, 8:04 AM

## 2021-04-29 LAB — BASIC METABOLIC PANEL
BUN/Creatinine Ratio: 10 (ref 10–24)
BUN: 13 mg/dL (ref 8–27)
CO2: 24 mmol/L (ref 20–29)
Calcium: 9.8 mg/dL (ref 8.6–10.2)
Chloride: 106 mmol/L (ref 96–106)
Creatinine, Ser: 1.26 mg/dL (ref 0.76–1.27)
Glucose: 84 mg/dL (ref 65–99)
Potassium: 3.7 mmol/L (ref 3.5–5.2)
Sodium: 145 mmol/L — ABNORMAL HIGH (ref 134–144)
eGFR: 63 mL/min/{1.73_m2} (ref >59)

## 2021-04-29 MED ORDER — AMLODIPINE BESYLATE 2.5 MG PO TABS: 2.5000 mg | ORAL_TABLET | Freq: Every day | ORAL | 6 refills | Status: DC

## 2021-04-30 ENCOUNTER — Telehealth: Payer: Self-pay

## 2021-04-30 NOTE — Telephone Encounter (Signed)
-----   Message from Hoy Register, MD sent at 04/29/2021  8:08 AM EDT ----- Labs are normal

## 2021-04-30 NOTE — Telephone Encounter (Signed)
Patient name and DOB has been verified Patient was informed of lab results. Patient had no questions.  

## 2021-06-01 NOTE — Progress Notes (Deleted)
Subjective:    Martin Mason is a 65 y.o. male who presents for a welcome to Medicare exam.   Cardiac risk factors: {risk factors:510}.  Depression Screen (Note: if answer to either of the following is "Yes", a more complete depression screening is indicated)  Q1: Over the past two weeks, have you felt down, depressed or hopeless? {yes/no:311178} Q2: Over the past two weeks, have you felt little interest or pleasure in doing things? {yes/no:311178}  Activities of Daily Living In your present state of health, do you have any difficulty performing the following activities?:  Preparing food and eating?: {yes/no (default no):140031::"No"} Bathing yourself: {yes/no (default no):140031::"No"} Getting dressed: {yes/no (default no):140031::"No"} Using the toilet:{yes/no (default no):140031::"No"} Moving around from place to place: {yes/no (default no):140031::"No"} In the past year have you fallen or had a near fall?:{yes/no (default no):140031::"No"}  Current exercise habits: {exercise:19826}  Dietary issues discussed: ***  Hearing difficulties: {yes/no (default no):140031::"No"} Safe in current home environment: {yes no free text:314490}  {Common ambulatory SmartLinks:19316} Review of Systems {ros; complete:30496}    Objective:     Vision by Snellen chart: right eye:{vision:19455::"20/20"}, left eye:{vision:19455::"20/20"} There were no vitals taken for this visit. There is no height or weight on file to calculate BMI. {Exam, KCMK:34917}    Assessment:    ***     Plan:     During the course of the visit the patient was educated and counseled about appropriate screening and preventive services including:   {plan:19837}  Patient Instructions (the written plan) was given to the patient.     Godfrey Pick. Tiburcio Pea, MSN, [facility]  [address] [place], [place], [place] [address] [phone]

## 2021-06-02 ENCOUNTER — Encounter: Payer: Medicare (Managed Care) | Admitting: Family Medicine

## 2021-09-11 ENCOUNTER — Ambulatory Visit (HOSPITAL_BASED_OUTPATIENT_CLINIC_OR_DEPARTMENT_OTHER): Payer: Medicaid Other

## 2021-09-11 DIAGNOSIS — Z5329 Procedure and treatment not carried out because of patient's decision for other reasons: Secondary | ICD-10-CM

## 2021-09-11 DIAGNOSIS — Z91199 Patient's noncompliance with other medical treatment and regimen due to unspecified reason: Secondary | ICD-10-CM

## 2021-09-11 NOTE — Patient Instructions (Signed)
Health Maintenance, Male Adopting a healthy lifestyle and getting preventive care are important in promoting health and wellness. Ask your health care provider about: The right schedule for you to have regular tests and exams. Things you can do on your own to prevent diseases and keep yourself healthy. What should I know about diet, weight, and exercise? Eat a healthy diet  Eat a diet that includes plenty of vegetables, fruits, low-fat dairy products, and lean protein. Do not eat a lot of foods that are high in solid fats, added sugars, or sodium. Maintain a healthy weight Body mass index (BMI) is a measurement that can be used to identify possible weight problems. It estimates body fat based on height and weight. Your health care provider can help determine your BMI and help you achieve or maintain a healthy weight. Get regular exercise Get regular exercise. This is one of the most important things you can do for your health. Most adults should: Exercise for at least 150 minutes each week. The exercise should increase your heart rate and make you sweat (moderate-intensity exercise). Do strengthening exercises at least twice a week. This is in addition to the moderate-intensity exercise. Spend less time sitting. Even light physical activity can be beneficial. Watch cholesterol and blood lipids Have your blood tested for lipids and cholesterol at 67 years of age, then have this test every 5 years. You may need to have your cholesterol levels checked more often if: Your lipid or cholesterol levels are high. You are older than 67 years of age. You are at high risk for heart disease. What should I know about cancer screening? Many types of cancers can be detected early and may often be prevented. Depending on your health history and family history, you may need to have cancer screening at various ages. This may include screening for: Colorectal cancer. Prostate cancer. Skin cancer. Lung  cancer. What should I know about heart disease, diabetes, and high blood pressure? Blood pressure and heart disease High blood pressure causes heart disease and increases the risk of stroke. This is more likely to develop in people who have high blood pressure readings, are of African descent, or are overweight. Talk with your health care provider about your target blood pressure readings. Have your blood pressure checked: Every 3-5 years if you are 18-39 years of age. Every year if you are 40 years old or older. If you are between the ages of 65 and 75 and are a current or former smoker, ask your health care provider if you should have a one-time screening for abdominal aortic aneurysm (AAA). Diabetes Have regular diabetes screenings. This checks your fasting blood sugar level. Have the screening done: Once every three years after age 45 if you are at a normal weight and have a low risk for diabetes. More often and at a younger age if you are overweight or have a high risk for diabetes. What should I know about preventing infection? Hepatitis B If you have a higher risk for hepatitis B, you should be screened for this virus. Talk with your health care provider to find out if you are at risk for hepatitis B infection. Hepatitis C Blood testing is recommended for: Everyone born from 1945 through 1965. Anyone with known risk factors for hepatitis C. Sexually transmitted infections (STIs) You should be screened each year for STIs, including gonorrhea and chlamydia, if: You are sexually active and are younger than 67 years of age. You are older than 67 years   of age and your health care provider tells you that you are at risk for this type of infection. Your sexual activity has changed since you were last screened, and you are at increased risk for chlamydia or gonorrhea. Ask your health care provider if you are at risk. Ask your health care provider about whether you are at high risk for HIV.  Your health care provider may recommend a prescription medicine to help prevent HIV infection. If you choose to take medicine to prevent HIV, you should first get tested for HIV. You should then be tested every 3 months for as long as you are taking the medicine. Follow these instructions at home: Lifestyle Do not use any products that contain nicotine or tobacco, such as cigarettes, e-cigarettes, and chewing tobacco. If you need help quitting, ask your health care provider. Do not use street drugs. Do not share needles. Ask your health care provider for help if you need support or information about quitting drugs. Alcohol use Do not drink alcohol if your health care provider tells you not to drink. If you drink alcohol: Limit how much you have to 0-2 drinks a day. Be aware of how much alcohol is in your drink. In the [place] one drink equals one 12 oz bottle of beer (355 mL), one 5 oz glass of wine (148 mL), or one 1 oz glass of hard liquor (44 mL). General instructions Schedule regular health, dental, and eye exams. Stay current with your vaccines. Tell your health care provider if: You often feel depressed. You have ever been abused or do not feel safe at home. Summary Adopting a healthy lifestyle and getting preventive care are important in promoting health and wellness. Follow your health care provider's instructions about healthy diet, exercising, and getting tested or screened for diseases. Follow your health care provider's instructions on monitoring your cholesterol and blood pressure. This information is not intended to replace advice given to you by your health care provider. Make sure you discuss any questions you have with your health care provider. Document Revised: 02/19/2021 Document Reviewed: 12/05/2018 Elsevier Patient Education  2022 Elsevier Inc.  

## 2021-10-23 ENCOUNTER — Other Ambulatory Visit: Payer: Self-pay | Admitting: Family Medicine

## 2021-10-23 DIAGNOSIS — I10 Essential (primary) hypertension: Secondary | ICD-10-CM

## 2021-10-23 NOTE — Telephone Encounter (Signed)
Requested medication (s) are due for refill today: yes  Requested medication (s) are on the active medication list: yes  Last refill:  04/28/21 #90 1 RF  Future visit scheduled: yes  Notes to clinic:  overdue Na level   Requested Prescriptions  Pending Prescriptions Disp Refills   lisinopril-hydrochlorothiazide (ZESTORETIC) 20-25 MG tablet [Pharmacy Med Name: Lisinopril-hydroCHLOROthiazide 20-25 MG Oral Tablet] 90 tablet 0    Sig: Take 1 tablet by mouth once daily     Cardiovascular:  ACEI + Diuretic Combos Failed - 10/23/2021  9:29 AM      Failed - Na in normal range and within 180 days    Sodium  Date Value Ref Range Status  04/28/2021 145 (H) 134 - 144 mmol/L Final          Failed - Last BP in normal range    BP Readings from Last 1 Encounters:  04/28/21 (!) 153/57          Passed - K in normal range and within 180 days    Potassium  Date Value Ref Range Status  04/28/2021 3.7 3.5 - 5.2 mmol/L Final          Passed - Cr in normal range and within 180 days    Creat  Date Value Ref Range Status  03/01/2017 1.24 0.70 - 1.25 mg/dL Final    Comment:      For patients > or = 67 years of age: The upper reference limit for Creatinine is approximately 13% higher for people identified as African-American.      Creatinine, Ser  Date Value Ref Range Status  04/28/2021 1.26 0.76 - 1.27 mg/dL Final          Passed - Ca in normal range and within 180 days    Calcium  Date Value Ref Range Status  04/28/2021 9.8 8.6 - 10.2 mg/dL Final   Calcium, Ion  Date Value Ref Range Status  11/06/2020 1.20 1.15 - 1.40 mmol/L Final          Passed - Patient is not pregnant      Passed - Valid encounter within last 6 months    Recent Outpatient Visits           5 months ago Essential hypertension   [facility] [place], Odette Horns, MD   1 year ago Rhinosinusitis   [facility] Hoy Register, MD   2 years ago  Tobacco abuse   [facility] Hoy Register, MD   2 years ago Tobacco abuse   [facility] Hoy Register, MD   2 years ago Xerosis of skin   [facility] Debby Bud, MD       Future Appointments             In 1 week Hoy Register, MD [facility]

## 2021-10-25 NOTE — Telephone Encounter (Signed)
Requested Prescriptions  Pending Prescriptions Disp Refills  . lisinopril-hydrochlorothiazide (ZESTORETIC) 20-25 MG tablet [Pharmacy Med Name: Lisinopril-hydroCHLOROthiazide 20-25 MG Oral Tablet] 90 tablet 0    Sig: Take 1 tablet by mouth once daily     Cardiovascular:  ACEI + Diuretic Combos Failed - 10/25/2021 10:35 AM      Failed - Na in normal range and within 180 days    Sodium  Date Value Ref Range Status  04/28/2021 145 (H) 134 - 144 mmol/L Final         Failed - Last BP in normal range    BP Readings from Last 1 Encounters:  04/28/21 (!) 153/57         Passed - K in normal range and within 180 days    Potassium  Date Value Ref Range Status  04/28/2021 3.7 3.5 - 5.2 mmol/L Final         Passed - Cr in normal range and within 180 days    Creat  Date Value Ref Range Status  03/01/2017 1.24 0.70 - 1.25 mg/dL Final    Comment:      For patients > or = 67 years of age: The upper reference limit for Creatinine is approximately 13% higher for people identified as African-American.      Creatinine, Ser  Date Value Ref Range Status  04/28/2021 1.26 0.76 - 1.27 mg/dL Final         Passed - Ca in normal range and within 180 days    Calcium  Date Value Ref Range Status  04/28/2021 9.8 8.6 - 10.2 mg/dL Final   Calcium, Ion  Date Value Ref Range Status  11/06/2020 1.20 1.15 - 1.40 mmol/L Final         Passed - Patient is not pregnant      Passed - Valid encounter within last 6 months    Recent Outpatient Visits          6 months ago Essential hypertension   [facility] [place], Odette Horns, MD   1 year ago Rhinosinusitis   [facility] Hoy Register, MD   2 years ago Tobacco abuse   [facility] Hoy Register, MD   2 years ago Tobacco abuse   [facility] Hoy Register, MD   2 years ago Xerosis of skin   [facility] Debby Bud, MD      Future Appointments            In 2 months Hoy Register, MD [facility]

## 2021-10-25 NOTE — Telephone Encounter (Signed)
Pt called to report that he is running low on his current supply.

## 2021-11-01 ENCOUNTER — Ambulatory Visit: Payer: Medicare (Managed Care) | Admitting: Family Medicine

## 2022-01-12 ENCOUNTER — Ambulatory Visit: Payer: Medicare (Managed Care) | Admitting: Family Medicine

## 2022-01-20 ENCOUNTER — Other Ambulatory Visit: Payer: Self-pay | Admitting: Family Medicine

## 2022-01-20 DIAGNOSIS — I10 Essential (primary) hypertension: Secondary | ICD-10-CM

## 2022-01-20 NOTE — Telephone Encounter (Signed)
Requested Prescriptions  Pending Prescriptions Disp Refills   lisinopril-hydrochlorothiazide (ZESTORETIC) 20-25 MG tablet [Pharmacy Med Name: Lisinopril-hydroCHLOROthiazide 20-25 MG Oral Tablet] 30 tablet 0    Sig: Take 1 tablet by mouth once daily     Cardiovascular:  ACEI + Diuretic Combos Failed - 01/20/2022  2:35 AM      Failed - Na in normal range and within 180 days    Sodium  Date Value Ref Range Status  04/28/2021 145 (H) 134 - 144 mmol/L Final         Failed - K in normal range and within 180 days    Potassium  Date Value Ref Range Status  04/28/2021 3.7 3.5 - 5.2 mmol/L Final         Failed - Cr in normal range and within 180 days    Creat  Date Value Ref Range Status  03/01/2017 1.24 0.70 - 1.25 mg/dL Final    Comment:      For patients > or = 68 years of age: The upper reference limit for Creatinine is approximately 13% higher for people identified as African-American.      Creatinine, Ser  Date Value Ref Range Status  04/28/2021 1.26 0.76 - 1.27 mg/dL Final         Failed - Ca in normal range and within 180 days    Calcium  Date Value Ref Range Status  04/28/2021 9.8 8.6 - 10.2 mg/dL Final   Calcium, Ion  Date Value Ref Range Status  11/06/2020 1.20 1.15 - 1.40 mmol/L Final         Failed - Last BP in normal range    BP Readings from Last 1 Encounters:  04/28/21 (!) 153/57         Failed - Valid encounter within last 6 months    Recent Outpatient Visits          8 months ago Essential hypertension   [facility] [place], Odette Horns, MD   1 year ago Rhinosinusitis   [facility] Hoy Register, MD   2 years ago Tobacco abuse   [facility] Hoy Register, MD   2 years ago Tobacco abuse   [facility] Hoy Register, MD   3 years ago Xerosis of skin   [facility] Debby Bud, MD      Future  Appointments            In 5 days Marcine Matar, MD [facility]           Passed - Patient is not pregnant

## 2022-01-25 ENCOUNTER — Other Ambulatory Visit: Payer: Self-pay

## 2022-01-25 ENCOUNTER — Ambulatory Visit: Payer: Medicare (Managed Care) | Attending: Family Medicine | Admitting: Internal Medicine

## 2022-01-25 ENCOUNTER — Encounter: Payer: Self-pay | Admitting: Internal Medicine

## 2022-01-25 VITALS — BP 163/77 | HR 51 | Resp 16 | Wt 121.2 lb

## 2022-01-25 DIAGNOSIS — F17200 Nicotine dependence, unspecified, uncomplicated: Secondary | ICD-10-CM | POA: Diagnosis not present

## 2022-01-25 DIAGNOSIS — Z1211 Encounter for screening for malignant neoplasm of colon: Secondary | ICD-10-CM | POA: Diagnosis not present

## 2022-01-25 DIAGNOSIS — D509 Iron deficiency anemia, unspecified: Secondary | ICD-10-CM | POA: Diagnosis not present

## 2022-01-25 DIAGNOSIS — I10 Essential (primary) hypertension: Secondary | ICD-10-CM | POA: Diagnosis not present

## 2022-01-25 DIAGNOSIS — Z23 Encounter for immunization: Secondary | ICD-10-CM

## 2022-01-25 MED ORDER — LISINOPRIL-HYDROCHLOROTHIAZIDE 20-25 MG PO TABS: 1.0000 | ORAL_TABLET | Freq: Every day | ORAL | 0 refills | Status: DC

## 2022-01-25 MED ORDER — AMLODIPINE BESYLATE 2.5 MG PO TABS: 2.5000 mg | ORAL_TABLET | Freq: Every day | ORAL | 0 refills | Status: DC

## 2022-01-25 NOTE — Progress Notes (Addendum)
Patient ID: Martin Mason, male    DOB: 1956-05-22  MRN: [id number]  CC: Hypertension   Subjective: Martin Mason is a 66 y.o. male who presents for chronic disease management.  PCP is Dr. Alvis Lemmings.  He last saw her 04/2021 His concerns today include:  hypertension, tobacco abuse, ED  HYPERTENSION Currently taking: see medication list Med Adherence: []  Yes    [x]  No suppose to be on Lis/HCTZ and Norvasc but only has Lis/HCTZ with him.  Takes the Lis/HCTZ regularly and took already for today.  He does not recall the Norvasc was added on last visit Medication side effects: []  Yes    [x]  No Adherence with salt restriction: [x]  Yes    []  No Home Monitoring?: [x]  Yes    []  No Monitoring Frequency: infrequent Home BP results range: does not recall readings SOB? []  Yes    [x]  No Chest Pain?: []  Yes    [x]  No Leg swelling?: [x]  Yes -gets mild swelling above sock line   []  No Headaches?: []  Yes    [x]  No Dizziness? []  Yes    [x]  No Comments:   Tob dep: smokes 3 cig/day.  Reports he has cut back from 1/2 pk a day.  He is not ready to give a trial of quitting.  HM:  due for flu shot. Referred for c-scope last yr by PCP but looks like he was never called.  Mom had colon CA Patient Active Problem List   Diagnosis Date Noted   Tobacco abuse 03/01/2017   Other male erectile dysfunction 06/18/2015   Essential hypertension, benign 11/28/2013   HYPERTROPHY PROSTATE W/O UR OBST & OTH LUTS 03/22/2011   FLANK PAIN, RIGHT 03/22/2011   NEOPLASM, MALIGNANT, COLON, HX OF 03/22/2011   DELIRIUM TREMENS 02/04/2011   Essential hypertension 09/21/2010   ELECTROCARDIOGRAM, ABNORMAL 09/21/2010   ALCOHOLISM 10/27/2009     Current Outpatient Medications on File Prior to Visit  Medication Sig Dispense Refill   sildenafil (VIAGRA) 50 MG tablet Take 1 tablet (50 mg total) by mouth daily as needed for erectile dysfunction. At least 24 hours between doses (Patient not taking: Reported on 01/25/2022) 10 tablet 1    No current facility-administered medications on file prior to visit.    No Known Allergies  Social History   Socioeconomic History   Marital status: Divorced    Spouse name: Not on file   Number of children: Not on file   Years of education: Not on file   Highest education level: Not on file  Occupational History   Not on file  Tobacco Use   Smoking status: Every Day    Packs/day: 0.12    Types: Cigarettes   Smokeless tobacco: Never   Tobacco comments:    3 daily  Vaping Use   Vaping Use: Never used  Substance and Sexual Activity   Alcohol use: No    Comment: quit drinking 7 years ago after 30 years of drinking   Drug use: Yes    Types: Marijuana    Comment: last time early November 2021   Sexual activity: Not on file  Other Topics Concern   Not on file  Social History Narrative   Not on file   Social Determinants of Health   Financial Resource Strain: Not on file  Food Insecurity: Not on file  Transportation Needs: Not on file  Physical Activity: Not on file  Stress: Not on file  Social Connections: Not on file  Intimate Partner Violence:  Not on file    Family History  Problem Relation Age of Onset   Hypertension Mother    Cancer Mother    Hypertension Father    Diabetes Sister     Past Surgical History:  Procedure Laterality Date   COLONOSCOPY     CYST EXCISION     throat   TOOTH EXTRACTION N/A 11/06/2020   Procedure: DENTAL RESTORATION/EXTRACTIONS;  Surgeon: Ocie DoyneJensen, Scott, DDS;  Location: [facility] OR;  Service: Oral Surgery;  Laterality: N/A;    ROS: Review of Systems  Constitutional:        Patient reports good appetite.  However he reports some decreased taste  Gastrointestinal:        Is moving his bowels okay.  Denies any blood in his stools.  Genitourinary:  Negative for difficulty urinating.    PHYSICAL EXAM: BP (!) 163/77    Pulse (!) 51    Resp 16    Wt 121 lb 3.2 oz (55 kg)    SpO2 100%    BMI 18.16 kg/m   Wt Readings from Last 3  Encounters:  01/25/22 121 lb 3.2 oz (55 kg)  04/28/21 116 lb (52.6 kg)  11/06/20 139 lb 15.9 oz (63.5 kg)    Physical Exam   General appearance - alert, pleasant elderly male who appears small framed.   Mental status -patient is oriented to person, and place.  However he is a little forgetful.   Eyes -patient with arcus senilis Neck - supple, no significant adenopathy Chest - clear to auscultation, no wheezes, rales or rhonchi, symmetric air entry Heart -regular rate and rhythm with occasional ectopy Extremities -trace lower extremity edema above his socks line  CMP Latest Ref Rng & Units 04/28/2021 11/12/2020 11/06/2020  Glucose 65 - 99 mg/dL 84 82 71  BUN 8 - 27 mg/dL 13 6(L) 11  Creatinine 0.76 - 1.27 mg/dL 1.471.26 8.291.11 5.620.80  Sodium 134 - 144 mmol/L 145(H) 141 138  Potassium 3.5 - 5.2 mmol/L 3.7 4.4 4.3  Chloride 96 - 106 mmol/L 106 104 103  CO2 20 - 29 mmol/L 24 24 -  Calcium 8.6 - 10.2 mg/dL 9.8 9.5 -  Total Protein 6.0 - 8.5 g/dL - - -  Total Bilirubin 0.0 - 1.2 mg/dL - - -  Alkaline Phos 39 - 117 IU/L - - -  AST 0 - 40 IU/L - - -  ALT 0 - 44 IU/L - - -   Lipid Panel     Component Value Date/Time   CHOL 156 07/03/2019 1105   TRIG 67 07/03/2019 1105   HDL 54 07/03/2019 1105   CHOLHDL 2.9 07/03/2019 1105   CHOLHDL 3.4 12/16/2014 1249   VLDL 13 12/16/2014 1249   LDLCALC 89 07/03/2019 1105    CBC    Component Value Date/Time   WBC 5.9 10/30/2015 2131   RBC 4.36 10/30/2015 2131   HGB 11.9 (L) 11/06/2020 0940   HCT 35.0 (L) 11/06/2020 0940   PLT 225 10/30/2015 2131   MCV 83.5 10/30/2015 2131   MCH 28.4 10/30/2015 2131   MCHC 34.1 10/30/2015 2131   RDW 13.6 10/30/2015 2131   LYMPHSABS 2.0 12/16/2014 1249   MONOABS 0.5 12/16/2014 1249   EOSABS 0.3 12/16/2014 1249   BASOSABS 0.1 12/16/2014 1249    ASSESSMENT AND PLAN:  1. Essential hypertension Not at goal.  He will continue lisinopril/HCTZ.  Add low-dose amlodipine as was intended by his PCP when he was last  seen.  Continue low-salt  diet. - Comprehensive metabolic panel - CBC - lisinopril-hydrochlorothiazide (ZESTORETIC) 20-25 MG tablet; Take 1 tablet by mouth daily.  Dispense: 90 tablet; Refill: 0 - amLODipine (NORVASC) 2.5 MG tablet; Take 1 tablet (2.5 mg total) by mouth daily.  Dispense: 90 tablet; Refill: 0  2. Tobacco dependence Advised to quit.  Discussed health risks associated with smoking.  Patient not ready to give a trial of quitting.  3. Need for immunization against influenza - Flu Vaccine QUAD 46mo+IM (Fluarix, Fluzone & Alfiuria Quad PF)  4. Screening for colon cancer I have resubmitted referral to gastroenterology for colonoscopy. - Ambulatory referral to Gastroenterology   Patient was given the opportunity to ask questions.  Patient verbalized understanding of the plan and was able to repeat key elements of the plan.   Addendum: Please see telephone note from 01/26/2022.  Patient with microcytic anemia.  Iron studies will be added to blood  that was already drawn. Orders Placed This Encounter  Procedures   Flu Vaccine QUAD 5mo+IM (Fluarix, Fluzone & Alfiuria Quad PF)   Comprehensive metabolic panel   CBC   Ambulatory referral to Gastroenterology     Requested Prescriptions   Signed Prescriptions Disp Refills   lisinopril-hydrochlorothiazide (ZESTORETIC) 20-25 MG tablet 90 tablet 0    Sig: Take 1 tablet by mouth daily.   amLODipine (NORVASC) 2.5 MG tablet 90 tablet 0    Sig: Take 1 tablet (2.5 mg total) by mouth daily.    Return in about 3 months (around 04/24/2022) for f/u in 3 mths with PCP Dr. Alvis Lemmings.  F/U with Franky Macho in 2 wks for BP recheck.  Jonah Blue, MD, FACP

## 2022-01-26 ENCOUNTER — Telehealth: Payer: Self-pay | Admitting: Internal Medicine

## 2022-01-26 LAB — COMPREHENSIVE METABOLIC PANEL
ALT: 14 IU/L (ref 0–44)
AST: 24 IU/L (ref 0–40)
Albumin/Globulin Ratio: 1.3 (ref 1.2–2.2)
Albumin: 4.1 g/dL (ref 3.8–4.8)
Alkaline Phosphatase: 79 IU/L (ref 44–121)
BUN/Creatinine Ratio: 11 (ref 10–24)
BUN: 14 mg/dL (ref 8–27)
Bilirubin Total: 0.3 mg/dL (ref 0.0–1.2)
CO2: 23 mmol/L (ref 20–29)
Calcium: 9.6 mg/dL (ref 8.6–10.2)
Chloride: 101 mmol/L (ref 96–106)
Creatinine, Ser: 1.29 mg/dL — ABNORMAL HIGH (ref 0.76–1.27)
Globulin, Total: 3.2 g/dL (ref 1.5–4.5)
Glucose: 75 mg/dL (ref 70–99)
Potassium: 3.8 mmol/L (ref 3.5–5.2)
Sodium: 144 mmol/L (ref 134–144)
Total Protein: 7.3 g/dL (ref 6.0–8.5)
eGFR: 61 mL/min/{1.73_m2} (ref >59)

## 2022-01-26 LAB — CBC
Hematocrit: 32.7 % — ABNORMAL LOW (ref 37.5–51.0)
Hemoglobin: 10.4 g/dL — ABNORMAL LOW (ref 13.0–17.7)
MCH: 24.1 pg — ABNORMAL LOW (ref 26.6–33.0)
MCHC: 31.8 g/dL (ref 31.5–35.7)
MCV: 76 fL — ABNORMAL LOW (ref 79–97)
Platelets: 356 10*3/uL (ref 150–450)
RBC: 4.31 x10E6/uL (ref 4.14–5.80)
RDW: 15.9 % — ABNORMAL HIGH (ref 11.6–15.4)
WBC: 7.2 10*3/uL (ref 3.4–10.8)

## 2022-01-26 NOTE — Addendum Note (Signed)
Addended by: Karle Plumber B on: 01/26/2022 11:27 AM   Modules accepted: Orders

## 2022-01-26 NOTE — Telephone Encounter (Signed)
Phone call placed to patient this morning to go over lab results. Patient informed that labs shows that he has worsening anemia that may be due to iron deficiency.  I have asked the lab to add iron studies to blood test done yesterday.  If it is iron deficiency we will get him on iron supplement.  Because of the anemia, I also stressed the importance of getting colonoscopy done to screen for colon cancer.  He denies any blood in the stools or black stools at this time.  He is agreeable to having the colonoscopy done and will await a call regarding that. I also told him that his kidney function is not 100% but stable compared to when it was last checked in May of last year by his PCP. All questions were answered for patient.

## 2022-01-27 ENCOUNTER — Other Ambulatory Visit: Payer: Self-pay | Admitting: Internal Medicine

## 2022-01-27 LAB — SPECIMEN STATUS REPORT

## 2022-01-27 LAB — IRON,TIBC AND FERRITIN PANEL
Ferritin: 15 ng/mL — ABNORMAL LOW (ref 30–400)
Iron Saturation: 8 % — CL (ref 15–55)
Iron: 29 ug/dL — ABNORMAL LOW (ref 38–169)
Total Iron Binding Capacity: 365 ug/dL (ref 250–450)
UIBC: 336 ug/dL (ref 111–343)

## 2022-01-27 MED ORDER — FERROUS SULFATE 325 (65 FE) MG PO TABS: 325.0000 mg | ORAL_TABLET | Freq: Every day | ORAL | 0 refills | Status: DC

## 2022-01-31 ENCOUNTER — Telehealth: Payer: Self-pay

## 2022-01-31 NOTE — Telephone Encounter (Signed)
Contacted pt to go over lab results pt is aware and doesn't have any questions or concerns 

## 2022-02-01 ENCOUNTER — Telehealth: Payer: Self-pay | Admitting: Family Medicine

## 2022-02-01 NOTE — Telephone Encounter (Signed)
Pt needs [facility] transportation to pick him up on Feb 14 for his 2 pm appointment w/ Franky Macho.

## 2022-02-08 ENCOUNTER — Ambulatory Visit: Payer: Medicare (Managed Care) | Admitting: Pharmacist

## 2022-02-10 ENCOUNTER — Other Ambulatory Visit: Payer: Self-pay

## 2022-02-10 ENCOUNTER — Ambulatory Visit: Payer: Medicare (Managed Care) | Attending: Family Medicine | Admitting: Pharmacist

## 2022-02-10 ENCOUNTER — Encounter: Payer: Self-pay | Admitting: Pharmacist

## 2022-02-10 VITALS — BP 166/72

## 2022-02-10 DIAGNOSIS — I10 Essential (primary) hypertension: Secondary | ICD-10-CM | POA: Diagnosis not present

## 2022-02-10 NOTE — Progress Notes (Signed)
° °  S:    PCP: Dr. Alvis Lemmings   No chief complaint on file.  Martin Mason is a 66 y.o. male who presents for hypertension evaluation, education, and management. PMH is significant for HTN, alcoholism, ED and tobacco abuse. Patient was referred on 01/25/2022 by Dr. Laural Benes.  Today, He arrives in good spirits and presents without assistance. Denies dizziness, headache, blurred vision, swelling.   Patient reports hypertension is longstanding.   Family/Social history:  -Fhx: HTN, DM -Tobacco: has cut back to 2 cigarettes daily -Alcohol: none reported   Medication adherence reported. Patient has taken BP medications today.   Current antihypertensives include:  amlodipine 2.5 mg daily, lisinopril-HCTZ 20-25 mg daily   Reported home BP readings: none  Patient reported dietary habits:  -Uses garlic salt when cooking food. Denies adding salt to his food at the table -Denies drinking soda or coffee but does drink sweet tea daily   Patient-reported exercise habits: - Walks, plays basketball for about 2-3 hours daily    O:  Vitals:   02/10/22 1526  BP: (!) 166/72     Last 3 Office BP readings: BP Readings from Last 3 Encounters:  02/10/22 (!) 166/72  01/25/22 (!) 163/77  04/28/21 (!) 153/57    BMET    Component Value Date/Time   NA 144 01/25/2022 1414   K 3.8 01/25/2022 1414   CL 101 01/25/2022 1414   CO2 23 01/25/2022 1414   GLUCOSE 75 01/25/2022 1414   GLUCOSE 71 11/06/2020 0940   BUN 14 01/25/2022 1414   CREATININE 1.29 (H) 01/25/2022 1414   CREATININE 1.24 03/01/2017 1229   CALCIUM 9.6 01/25/2022 1414   GFRNONAA 69 11/12/2020 1057   GFRNONAA 62 03/01/2017 1229   GFRAA 80 11/12/2020 1057   GFRAA 72 03/01/2017 1229    Renal function: CrCl cannot be calculated (Unknown ideal weight.).  Clinical ASCVD: No  The 10-year ASCVD risk score (Arnett DK, et al., 2019) is: 38.6%   Values used to calculate the score:     Age: 69 years     Sex: Male     Is Non-Hispanic  African American: Yes     Diabetic: No     Tobacco smoker: Yes     Systolic Blood Pressure: 166 mmHg     Is BP treated: Yes     HDL Cholesterol: 54 mg/dL     Total Cholesterol: 156 mg/dL   A/P: Hypertension longstanding currently above goal on current medications. BP goal < 130/80 mmHg. Medication adherence appears appropriate. Pt would like to hold off on making changes today and return in 1 month for reassessment.   -Continued current regimen.  -F/u labs ordered - none -Counseled on lifestyle modifications for blood pressure control including reduced dietary sodium, increased exercise, adequate sleep. -Encouraged patient to check BP at home and bring log of readings to next visit. Counseled on proper use of home BP cuff.   Results reviewed and written information provided. Patient verbalized understanding of treatment plan. Total time in face-to-face counseling 30 minutes.   F/u clinic visit in 1 month.  [name], PharmD, [place]y [place], CPP Clinical Pharmacist [facility] [phone]

## 2022-03-10 ENCOUNTER — Ambulatory Visit: Payer: Medicare (Managed Care) | Attending: Family Medicine | Admitting: Pharmacist

## 2022-03-10 ENCOUNTER — Encounter: Payer: Self-pay | Admitting: Pharmacist

## 2022-03-10 ENCOUNTER — Other Ambulatory Visit: Payer: Self-pay

## 2022-03-10 DIAGNOSIS — I10 Essential (primary) hypertension: Secondary | ICD-10-CM | POA: Diagnosis not present

## 2022-03-10 MED ORDER — AMLODIPINE BESYLATE 2.5 MG PO TABS: 2.5000 mg | ORAL_TABLET | Freq: Every day | ORAL | 0 refills | Status: DC

## 2022-03-10 MED ORDER — LISINOPRIL-HYDROCHLOROTHIAZIDE 20-25 MG PO TABS: 1.0000 | ORAL_TABLET | Freq: Every day | ORAL | 0 refills | Status: DC

## 2022-03-10 NOTE — Progress Notes (Signed)
? ?  S:    ?PCP: Dr. Alvis Lemmings  ? ?No chief complaint on file. ? ?Martin Mason is a 66 y.o. male who presents for hypertension evaluation, education, and management. PMH is significant for HTN, alcoholism, ED and tobacco abuse. Patient was referred on 01/25/2022 by Dr. Laural Benes. I saw him on 02/10/2022 and his BP was elevated. I suggested changes and he declined changes. Of note, it was noted that he was not taking amlodipine consistently.  ? ?Today, He arrives in good spirits and presents without assistance. Denies dizziness, headache, blurred vision, swelling.  ? ?Patient reports hypertension is longstanding.  ? ?Family/Social history:  ?-Fhx: HTN, DM ?-Tobacco: has cut back to 2 cigarettes daily ?-Alcohol: none reported  ? ?Medication adherence reported. Patient has taken BP medications today.  ? ?Current antihypertensives include:  amlodipine 2.5 mg daily, lisinopril-HCTZ 20-25 mg daily  ? ?Reported home BP readings: none ? ?Patient reported dietary habits:  ?-Uses garlic salt when cooking food. Denies adding salt to his food at the table ?-Denies drinking soda or coffee but does drink sweet tea daily  ? ?Patient-reported exercise habits: ?- Walks, plays basketball for about 2-3 hours daily  ? ? ?O:  ?Vitals:  ? 03/10/22 1519  ?BP: 137/83  ? ? ?Last 3 Office BP readings: ?BP Readings from Last 3 Encounters:  ?03/10/22 137/83  ?02/10/22 (!) 166/72  ?01/25/22 (!) 163/77  ? ? ?BMET ?   ?Component Value Date/Time  ? NA 144 01/25/2022 1414  ? K 3.8 01/25/2022 1414  ? CL 101 01/25/2022 1414  ? CO2 23 01/25/2022 1414  ? GLUCOSE 75 01/25/2022 1414  ? GLUCOSE 71 11/06/2020 0940  ? BUN 14 01/25/2022 1414  ? CREATININE 1.29 (H) 01/25/2022 1414  ? CREATININE 1.24 03/01/2017 1229  ? CALCIUM 9.6 01/25/2022 1414  ? GFRNONAA 69 11/12/2020 1057  ? GFRNONAA 62 03/01/2017 1229  ? GFRAA 80 11/12/2020 1057  ? GFRAA 72 03/01/2017 1229  ? ? ?Renal function: ?CrCl cannot be calculated (Patient's most recent lab result is older than the maximum  21 days allowed.). ? ?Clinical ASCVD: No  ?The 10-year ASCVD risk score (Arnett DK, et al., 2019) is: 28.7% ?  Values used to calculate the score: ?    Age: 89 years ?    Sex: Male ?    Is Non-Hispanic African American: Yes ?    Diabetic: No ?    Tobacco smoker: Yes ?    Systolic Blood Pressure: 137 mmHg ?    Is BP treated: Yes ?    HDL Cholesterol: 54 mg/dL ?    Total Cholesterol: 156 mg/dL ? ? ?A/P: ?Hypertension longstanding currently close to goal on current medications. BP goal < 130/80 mmHg. Medication adherence appears appropriate. ?-Continued current regimen.  ?-Counseled on lifestyle modifications for blood pressure control including reduced dietary sodium, increased exercise, adequate sleep. ?-Encouraged patient to check BP at home and bring log of readings to next visit. Counseled on proper use of home BP cuff.  ? ?Results reviewed and written information provided. Patient verbalized understanding of treatment plan. Total time in face-to-face counseling 30 minutes.  ? ?F/u clinic visit in 1 month. ? ?[name], PharmD, BCACP, CPP ?Clinical Pharmacist ?[facility] ?[phone] ? ? ?

## 2022-03-28 ENCOUNTER — Telehealth: Payer: Self-pay | Admitting: Family Medicine

## 2022-03-28 NOTE — Telephone Encounter (Signed)
Copied from [place] [id number]. Topic: General - Other >> Mar 28, 2022  9:13 AM Leward Quan A wrote: Reason for CRM: Patient called in to inquire about a medication that he is supposed to be getting to help him with his food since he can not taste or smell his food. Per patient he is very confused and went to the pharmacy but they have no idea what he was inquiring about. Please advise and call patient at Ph# [phone]

## 2022-03-28 NOTE — Telephone Encounter (Signed)
I am not sure what he is referring to.  He has not seen me since 04/2021 and will need to schedule an appointment.  He had a visit with Lurena Joiner 2 weeks ago and there is no mention of that. ?

## 2022-03-30 NOTE — Telephone Encounter (Signed)
Called pt made aware of below messages. Stated will f/u with PCP on his next visit ?

## 2022-04-20 ENCOUNTER — Ambulatory Visit: Payer: Medicare (Managed Care) | Attending: Family Medicine | Admitting: Family Medicine

## 2022-04-20 ENCOUNTER — Encounter: Payer: Self-pay | Admitting: Family Medicine

## 2022-04-20 VITALS — BP 135/70 | HR 72 | Ht 68.0 in | Wt 123.0 lb

## 2022-04-20 DIAGNOSIS — Z23 Encounter for immunization: Secondary | ICD-10-CM | POA: Diagnosis not present

## 2022-04-20 DIAGNOSIS — I10 Essential (primary) hypertension: Secondary | ICD-10-CM | POA: Diagnosis not present

## 2022-04-20 DIAGNOSIS — R430 Anosmia: Secondary | ICD-10-CM | POA: Diagnosis not present

## 2022-04-20 DIAGNOSIS — D649 Anemia, unspecified: Secondary | ICD-10-CM | POA: Diagnosis not present

## 2022-04-20 DIAGNOSIS — R432 Parageusia: Secondary | ICD-10-CM | POA: Diagnosis not present

## 2022-04-20 MED ORDER — FLUTICASONE PROPIONATE 50 MCG/ACT NA SUSP: 2.0000 | Freq: Every day | NASAL | 1 refills | Status: DC

## 2022-04-20 MED ORDER — LISINOPRIL-HYDROCHLOROTHIAZIDE 20-25 MG PO TABS: 1.0000 | ORAL_TABLET | Freq: Every day | ORAL | 1 refills | Status: DC

## 2022-04-20 MED ORDER — CETIRIZINE HCL 10 MG PO TABS: 10.0000 mg | ORAL_TABLET | Freq: Every day | ORAL | 1 refills | Status: DC

## 2022-04-20 MED ORDER — AMLODIPINE BESYLATE 2.5 MG PO TABS: 2.5000 mg | ORAL_TABLET | Freq: Every day | ORAL | 1 refills | Status: DC

## 2022-04-20 MED ORDER — FERROUS SULFATE 325 (65 FE) MG PO TABS: 325.0000 mg | ORAL_TABLET | Freq: Every day | ORAL | 1 refills | Status: DC

## 2022-04-20 MED ORDER — ZOSTER VAC RECOMB ADJUVANTED 50 MCG/0.5ML IM SUSR: 0.5000 mL | Freq: Once | INTRAMUSCULAR | 1 refills | Status: AC

## 2022-04-20 NOTE — Progress Notes (Signed)
? ?Subjective:  ?Patient ID: Martin Mason, male    DOB: 01/01/1956  Age: 66 y.o. MRN: [id number] ? ?CC: Hypertension ? ? ?HPI ?Martin Mason is a 66 y.o. year old male with a history of hypertension, tobacco abuse here for follow-up visit. ? ?Interval History: ?His BP is elevated and he states he took his antihypertensives.  He states he does have whitecoat hypertension. ? ?Complains of problems with taste and smell x2 mos. States he has a good appetite but food does not taste as good as it should. He has to bring something close to his nose to smell anything. He has a had time perceiving his cologne. ?Denies recent or past history of COVID. ?His voice is also getting hoarse but he has no weight loss (of note he states he has always been on the small side.)  Endorses presence of postnasal drip. ?He smokes 3 Cigarettes/day and states he is cutting back ? ?Past Medical History:  ?Diagnosis Date  ? Asthma   ? childhood- young child to age 39  ? Hypertension   ? ? ?Past Surgical History:  ?Procedure Laterality Date  ? COLONOSCOPY    ? CYST EXCISION    ? throat  ? TOOTH EXTRACTION N/A 11/06/2020  ? Procedure: DENTAL RESTORATION/EXTRACTIONS;  Surgeon: Ocie Doyne, DDS;  Location: [facility];  Service: Oral Surgery;  Laterality: N/A;  ? ? ?Family History  ?Problem Relation Age of Onset  ? Hypertension Mother   ? Cancer Mother   ? Hypertension Father   ? Diabetes Sister   ? ? ?Social History  ? ?Socioeconomic History  ? Marital status: Divorced  ?  Spouse name: Not on file  ? Number of children: Not on file  ? Years of education: Not on file  ? Highest education level: Not on file  ?Occupational History  ? Not on file  ?Tobacco Use  ? Smoking status: Every Day  ?  Packs/day: 0.12  ?  Types: Cigarettes  ? Smokeless tobacco: Never  ? Tobacco comments:  ?  3 daily  ?Vaping Use  ? Vaping Use: Never used  ?Substance and Sexual Activity  ? Alcohol use: No  ?  Comment: quit drinking 7 years ago after 30 years of drinking  ? Drug use:  Yes  ?  Types: Marijuana  ?  Comment: last time early November 2021  ? Sexual activity: Not on file  ?Other Topics Concern  ? Not on file  ?Social History Narrative  ? Not on file  ? ?Social Determinants of Health  ? ?Financial Resource Strain: Not on file  ?Food Insecurity: Not on file  ?Transportation Needs: Not on file  ?Physical Activity: Not on file  ?Stress: Not on file  ?Social Connections: Not on file  ? ? ?No Known Allergies ? ?Outpatient Medications Prior to Visit  ?Medication Sig Dispense Refill  ? sildenafil (VIAGRA) 50 MG tablet Take 1 tablet (50 mg total) by mouth daily as needed for erectile dysfunction. At least 24 hours between doses 10 tablet 1  ? amLODipine (NORVASC) 2.5 MG tablet Take 1 tablet (2.5 mg total) by mouth daily. 90 tablet 0  ? ferrous sulfate 325 (65 FE) MG tablet Take 1 tablet (325 mg total) by mouth daily with breakfast. 100 tablet 0  ? lisinopril-hydrochlorothiazide (ZESTORETIC) 20-25 MG tablet Take 1 tablet by mouth daily. 90 tablet 0  ? ?No facility-administered medications prior to visit.  ? ? ? ?ROS ?Review of Systems  ?Constitutional:  Negative for activity  change and appetite change.  ?HENT:  Negative for sinus pressure and sore throat.   ?Eyes:  Negative for visual disturbance.  ?Respiratory:  Negative for cough, chest tightness and shortness of breath.   ?Cardiovascular:  Negative for chest pain and leg swelling.  ?Gastrointestinal:  Negative for abdominal distention, abdominal pain, constipation and diarrhea.  ?Endocrine: Negative.   ?Genitourinary:  Negative for dysuria.  ?Musculoskeletal:  Negative for joint swelling and myalgias.  ?Skin:  Negative for rash.  ?Allergic/Immunologic: Negative.   ?Neurological:  Negative for weakness, light-headedness and numbness.  ?Psychiatric/Behavioral:  Negative for dysphoric mood and suicidal ideas.   ? ?Objective:  ?BP 135/70   Pulse 72   Ht 5\' 8"  (1.727 m)   Wt 123 lb (55.8 kg)   SpO2 100%   BMI 18.70 kg/m?  ? ? ?  04/20/2022  ?   2:24 PM 04/20/2022  ?  2:04 PM 03/10/2022  ?  3:19 PM  ?BP/Weight  ?Systolic BP 135 157 137  ?Diastolic BP 70 70 83  ?Wt. (Lbs)  123   ?BMI  18.7 kg/m2   ? ? ? ? ?Physical Exam ?Constitutional:   ?   Appearance: He is well-developed.  ?HENT:  ?   Right Ear: Tympanic membrane normal.  ?   Left Ear: Tympanic membrane normal.  ?   Mouth/Throat:  ?   Mouth: Mucous membranes are moist.  ?Cardiovascular:  ?   Rate and Rhythm: Normal rate.  ?   Heart sounds: Normal heart sounds. No murmur heard. ?Pulmonary:  ?   Effort: Pulmonary effort is normal.  ?   Breath sounds: Normal breath sounds. No wheezing or rales.  ?Chest:  ?   Chest wall: No tenderness.  ?Abdominal:  ?   General: Bowel sounds are normal. There is no distension.  ?   Palpations: Abdomen is soft. There is no mass.  ?   Tenderness: There is no abdominal tenderness.  ?Musculoskeletal:     ?   General: Normal range of motion.  ?   Right lower leg: No edema.  ?   Left lower leg: No edema.  ?Neurological:  ?   Mental Status: He is alert and oriented to person, place, and time.  ?Psychiatric:     ?   Mood and Affect: Mood normal.  ? ? ? ?  Latest Ref Rng & Units 01/25/2022  ?  2:14 PM 04/28/2021  ?  4:38 PM 11/12/2020  ? 10:57 AM  ?CMP  ?Glucose 70 - 99 mg/dL 75   84   82    ?BUN 8 - 27 mg/dL 14   13   6     ?Creatinine 0.76 - 1.27 mg/dL 11/14/2020     1.61    ?Sodium 134 - 144 mmol/L 144   145   141    ?Potassium 3.5 - 5.2 mmol/L 3.8   3.7   4.4    ?Chloride 96 - 106 mmol/L 101   106   104    ?CO2 20 - 29 mmol/L 23   24   24     ?Calcium 8.6 - 10.2 mg/dL 9.6   9.8   9.5    ?Total Protein 6.0 - 8.5 g/dL 7.3      ?Total Bilirubin 0.0 - 1.2 mg/dL 0.3      ?Alkaline Phos 44 - 121 IU/L 79      ?AST 0 - 40 IU/L 24      ?ALT 0 - 44 IU/L  14      ? ? ?Lipid Panel  ?   ?Component Value Date/Time  ? CHOL 156 07/03/2019 1105  ? TRIG 67 07/03/2019 1105  ? HDL 54 07/03/2019 1105  ? CHOLHDL 2.9 07/03/2019 1105  ? CHOLHDL 3.4 12/16/2014 1249  ? VLDL 13 12/16/2014 1249  ? LDLCALC 89  07/03/2019 1105  ? ? ?CBC ?   ?Component Value Date/Time  ? WBC 7.2 01/25/2022 1414  ? WBC 5.9 10/30/2015 2131  ? RBC 4.31 01/25/2022 1414  ? RBC 4.36 10/30/2015 2131  ? HGB 10.4 (L) 01/25/2022 1414  ? HCT 32.7 (L) 01/25/2022 1414  ? PLT 356 01/25/2022 1414  ? MCV 76 (L) 01/25/2022 1414  ? MCH 24.1 (L) 01/25/2022 1414  ? MCH 28.4 10/30/2015 2131  ? MCHC 31.8 01/25/2022 1414  ? MCHC 34.1 10/30/2015 2131  ? RDW 15.9 (H) 01/25/2022 1414  ? LYMPHSABS 2.0 12/16/2014 1249  ? MONOABS 0.5 12/16/2014 1249  ? EOSABS 0.3 12/16/2014 1249  ? BASOSABS 0.1 12/16/2014 1249  ? ? ?Lab Results  ?Component Value Date  ? HGBA1C 5.5 12/16/2014  ? ? ?Assessment & Plan:  ?1. Essential hypertension ?Initially elevated but repeat performed by myself is normal ?Continue current regimen ?Counseled on blood pressure goal of less than 130/80, low-sodium, DASH diet, medication compliance, 150 minutes of moderate intensity exercise per week. ?Discussed medication compliance, adverse effects. ?- lisinopril-hydrochlorothiazide (ZESTORETIC) 20-25 MG tablet; Take 1 tablet by mouth daily.  Dispense: 90 tablet; Refill: 1 ?- amLODipine (NORVASC) 2.5 MG tablet; Take 1 tablet (2.5 mg total) by mouth daily.  Dispense: 90 tablet; Refill: 1 ? ?2. Anosmia ?Could be secondary to sinus symptoms ?We will treat with antihistamine and nasal corticosteroid ?Advised that if symptoms persist after 1 month he needs to notify me and I will refer him to ENT ?- fluticasone (FLONASE) 50 MCG/ACT nasal spray; Place 2 sprays into both nostrils daily.  Dispense: 16 g; Refill: 1 ?- cetirizine (ZYRTEC) 10 MG tablet; Take 1 tablet (10 mg total) by mouth daily.  Dispense: 30 tablet; Refill: 1 ? ?3. Dysgeusia ?No history of COVID ?See #2 above ? ?4. Need for shingles vaccine ?- Zoster Vaccine Adjuvanted [facility](SHINGRIX) injection; Inject 0.5 mLs into the muscle once for 1 dose.  Dispense: 0.5 mL; Refill: 1 ? ?5. Anemia, unspecified type ?Last hemoglobin was 10.4 in 12/2021 ?He was  referred to GI for colonoscopy but is yet to schedule ?I have provided him with the number to GI as part of anemia work-up to exclude malignancy ?- ferrous sulfate 325 (65 FE) MG tablet; Take 1 tablet (325 mg total) by

## 2022-04-20 NOTE — Progress Notes (Signed)
Has problems with smell and taste. ?

## 2022-04-20 NOTE — Patient Instructions (Addendum)
Please call to schedule your colonoscopy: [facility] [address] [place], [place] [address] PH# [phone] 

## 2022-06-20 ENCOUNTER — Other Ambulatory Visit: Payer: Self-pay | Admitting: Family Medicine

## 2022-06-20 DIAGNOSIS — R430 Anosmia: Secondary | ICD-10-CM

## 2022-07-11 ENCOUNTER — Other Ambulatory Visit: Payer: Self-pay | Admitting: Family Medicine

## 2022-07-11 DIAGNOSIS — R430 Anosmia: Secondary | ICD-10-CM

## 2022-07-19 ENCOUNTER — Other Ambulatory Visit: Payer: Self-pay | Admitting: Family Medicine

## 2022-07-19 DIAGNOSIS — R430 Anosmia: Secondary | ICD-10-CM

## 2022-07-20 ENCOUNTER — Ambulatory Visit: Payer: Medicare (Managed Care) | Admitting: Family Medicine

## 2022-08-30 ENCOUNTER — Other Ambulatory Visit: Payer: Self-pay | Admitting: Family Medicine

## 2022-08-30 DIAGNOSIS — R430 Anosmia: Secondary | ICD-10-CM

## 2022-10-12 ENCOUNTER — Other Ambulatory Visit: Payer: Self-pay | Admitting: Family Medicine

## 2022-10-12 DIAGNOSIS — R430 Anosmia: Secondary | ICD-10-CM

## 2022-10-12 DIAGNOSIS — I10 Essential (primary) hypertension: Secondary | ICD-10-CM

## 2022-10-12 NOTE — Telephone Encounter (Signed)
Requested Prescriptions  Pending Prescriptions Disp Refills  . amLODipine (NORVASC) 2.5 MG tablet [Pharmacy Med Name: amLODIPine Besylate 2.5 MG Oral Tablet] 90 tablet 0    Sig: Take 1 tablet by mouth once daily     Cardiovascular: Calcium Channel Blockers 2 Passed - 10/12/2022  9:42 AM      Passed - Last BP in normal range    BP Readings from Last 1 Encounters:  04/20/22 135/70         Passed - Last Heart Rate in normal range    Pulse Readings from Last 1 Encounters:  04/20/22 72         Passed - Valid encounter within last 6 months    Recent Outpatient Visits          5 months ago [place], Charlane Ferretti, MD   7 months ago Essential hypertension   [place], Jarome Matin, RPH-CPP   8 months ago Essential hypertension   [place], Jarome Matin, RPH-CPP   8 months ago Essential hypertension   [place] Ladell Pier, MD   1 year ago Essential hypertension   [place], Enobong, MD      Future Appointments            In 3 weeks Charlott Rakes, MD [place]           . EQ ALLERGY RELIEF, CETIRIZINE, 10 MG tablet [Pharmacy Med Name: EQ Allergy Relief (Cetirizine) 10 MG Oral Tablet] 90 tablet 1    Sig: Take 1 tablet by mouth once daily     Ear, Nose, and Throat:  Antihistamines 2 Failed - 10/12/2022  9:42 AM      Failed - Cr in normal range and within 360 days    Creat  Date Value Ref Range Status  03/01/2017 1.24 0.70 - 1.25 mg/dL Final    Comment:      For patients > or = 68 years of age: The upper reference limit for Creatinine is approximately 13% higher for people identified as African-American.      Creatinine, Ser  Date Value Ref Range Status  01/25/2022 1.29 (H) 0.76 - 1.27 mg/dL Final         Passed - Valid encounter  within last 12 months    Recent Outpatient Visits          5 months ago [place], Enobong, MD   7 months ago Essential hypertension   [place], Jarome Matin, RPH-CPP   8 months ago Essential hypertension   [place], RPH-CPP   8 months ago Essential hypertension   [place] Ladell Pier, MD   1 year ago Essential hypertension   [place], Enobong, MD      Future Appointments            In 3 weeks Charlott Rakes, MD [place]           . fluticasone (FLONASE) 50 MCG/ACT nasal spray [Pharmacy Med Name: Fluticasone Propionate 50 MCG/ACT Nasal Suspension] 16 g 2    Sig: Use 2 spray(s) in each nostril once daily  Ear, Nose, and Throat: Nasal Preparations - Corticosteroids Passed - 10/12/2022  9:42 AM      Passed - Valid encounter within last 12 months    Recent Outpatient Visits          5 months ago [place], Enobong, MD   7 months ago Essential hypertension   [place], Jarome Matin, RPH-CPP   8 months ago Essential hypertension   [place], RPH-CPP   8 months ago Essential hypertension   [place] Ladell Pier, MD   1 year ago Essential hypertension   [place], Enobong, MD      Future Appointments            In 3 weeks Charlott Rakes, MD [place]           . lisinopril-hydrochlorothiazide (ZESTORETIC) 20-25 MG tablet [Pharmacy Med Name: Lisinopril-hydroCHLOROthiazide 20-25 MG Oral Tablet] 90 tablet 0    Sig: Take 1 tablet by mouth once daily      Cardiovascular:  ACEI + Diuretic Combos Failed - 10/12/2022  9:42 AM      Failed - Na in normal range and within 180 days    Sodium  Date Value Ref Range Status  01/25/2022 144 134 - 144 mmol/L Final         Failed - K in normal range and within 180 days    Potassium  Date Value Ref Range Status  01/25/2022 3.8 3.5 - 5.2 mmol/L Final         Failed - Cr in normal range and within 180 days    Creat  Date Value Ref Range Status  03/01/2017 1.24 0.70 - 1.25 mg/dL Final    Comment:      For patients > or = 68 years of age: The upper reference limit for Creatinine is approximately 13% higher for people identified as African-American.      Creatinine, Ser  Date Value Ref Range Status  01/25/2022 1.29 (H) 0.76 - 1.27 mg/dL Final         Failed - eGFR is 30 or above and within 180 days    GFR, Est African American  Date Value Ref Range Status  03/01/2017 72 >=60 mL/min Final   GFR calc Af Amer  Date Value Ref Range Status  11/12/2020 80 >59 mL/min/1.73 Final    Comment:    **In accordance with recommendations from the NKF-ASN Task force,**   [facility] is in the process of updating its eGFR calculation to the   2021 CKD-EPI creatinine equation that estimates kidney function   without a race variable.    GFR, Est Non African American  Date Value Ref Range Status  03/01/2017 62 >=60 mL/min Final   GFR calc non Af Amer  Date Value Ref Range Status  11/12/2020 69 >59 mL/min/1.73 Final   eGFR  Date Value Ref Range Status  01/25/2022 61 >59 mL/min/1.73 Final         Passed - Patient is not pregnant      Passed - Last BP in normal range    BP Readings from Last 1 Encounters:  04/20/22 135/70         Passed - Valid encounter within last 6 months    Recent Outpatient Visits  5 months ago [place], Enobong, MD   7 months ago Essential hypertension   [place],  Jarome Matin, RPH-CPP   8 months ago Essential hypertension   [place], Jarome Matin, RPH-CPP   8 months ago Essential hypertension   [place] Ladell Pier, MD   1 year ago Essential hypertension   [place], Enobong, MD      Future Appointments            In 3 weeks Charlott Rakes, MD [place]

## 2022-11-08 ENCOUNTER — Ambulatory Visit: Payer: Medicare (Managed Care) | Admitting: Family Medicine

## 2022-11-11 ENCOUNTER — Encounter: Payer: Self-pay | Admitting: Family Medicine

## 2022-11-11 ENCOUNTER — Ambulatory Visit: Payer: Medicare (Managed Care) | Attending: Family Medicine | Admitting: Family Medicine

## 2022-11-11 VITALS — BP 169/73 | HR 61 | Temp 98.2°F | Ht 68.0 in | Wt 123.0 lb

## 2022-11-11 DIAGNOSIS — I10 Essential (primary) hypertension: Secondary | ICD-10-CM

## 2022-11-11 DIAGNOSIS — I739 Peripheral vascular disease, unspecified: Secondary | ICD-10-CM

## 2022-11-11 DIAGNOSIS — Z131 Encounter for screening for diabetes mellitus: Secondary | ICD-10-CM | POA: Diagnosis not present

## 2022-11-11 DIAGNOSIS — Z23 Encounter for immunization: Secondary | ICD-10-CM | POA: Diagnosis not present

## 2022-11-11 LAB — POCT ABI - SCREENING FOR PILOT NO CHARGE: Left ABI: 0.69

## 2022-11-11 MED ORDER — ATORVASTATIN CALCIUM 20 MG PO TABS: 20.0000 mg | ORAL_TABLET | Freq: Every day | ORAL | 3 refills | Status: DC

## 2022-11-11 MED ORDER — AMLODIPINE BESYLATE 5 MG PO TABS: 5.0000 mg | ORAL_TABLET | Freq: Every day | ORAL | 1 refills | Status: DC

## 2022-11-11 NOTE — Progress Notes (Signed)
Subjective:  Patient ID: Martin Mason, male    DOB: Aug 24, 1956  Age: 66 y.o. MRN: [id number]  CC: Hypertension   HPI Martin Mason is a 66 y.o. year old male with a history of hypertension, tobacco abuse here for follow-up visit.   Interval History:  BP is elevated and he endorses taking his antihypertensive this morning but smoked an 1 hr ago.  When he walks to the store he has tightness in his left calf which he describes as a pulling which resolves when he rests. He sometimes has left ankle swelling.  Continues to smoke 4 Cigarettes/ day and states he is not ready to quit. Past Medical History:  Diagnosis Date   Asthma    childhood- young child to age 60   Hypertension     Past Surgical History:  Procedure Laterality Date   COLONOSCOPY     CYST EXCISION     throat   TOOTH EXTRACTION N/A 11/06/2020   Procedure: DENTAL RESTORATION/EXTRACTIONS;  Surgeon: Diona Browner, DDS;  Location: [facility];  Service: Oral Surgery;  Laterality: N/A;    Family History  Problem Relation Age of Onset   Hypertension Mother    Cancer Mother    Hypertension Father    Diabetes Sister     Social History   Socioeconomic History   Marital status: Divorced    Spouse name: Not on file   Number of children: Not on file   Years of education: Not on file   Highest education level: Not on file  Occupational History   Not on file  Tobacco Use   Smoking status: Every Day    Packs/day: 0.12    Types: Cigarettes   Smokeless tobacco: Never   Tobacco comments:    3 daily  Vaping Use   Vaping Use: Never used  Substance and Sexual Activity   Alcohol use: No    Comment: quit drinking 7 years ago after 30 years of drinking   Drug use: Yes    Types: Marijuana    Comment: last time early November 2021   Sexual activity: Not on file  Other Topics Concern   Not on file  Social History Narrative   Not on file   Social Determinants of Health   Financial Resource Strain: Not on file  Food  Insecurity: Not on file  Transportation Needs: Not on file  Physical Activity: Not on file  Stress: Not on file  Social Connections: Not on file    No Known Allergies  Outpatient Medications Prior to Visit  Medication Sig Dispense Refill   EQ ALLERGY RELIEF, CETIRIZINE, 10 MG tablet Take 1 tablet by mouth once daily 90 tablet 1   ferrous sulfate 325 (65 FE) MG tablet Take 1 tablet (325 mg total) by mouth daily with breakfast. 90 tablet 1   fluticasone (FLONASE) 50 MCG/ACT nasal spray Use 2 spray(s) in each nostril once daily 16 g 2   lisinopril-hydrochlorothiazide (ZESTORETIC) 20-25 MG tablet Take 1 tablet by mouth once daily 90 tablet 0   sildenafil (VIAGRA) 50 MG tablet Take 1 tablet (50 mg total) by mouth daily as needed for erectile dysfunction. At least 24 hours between doses 10 tablet 1   amLODipine (NORVASC) 2.5 MG tablet Take 1 tablet by mouth once daily 90 tablet 0   No facility-administered medications prior to visit.     ROS Review of Systems  Constitutional:  Negative for activity change and appetite change.  HENT:  Negative for sinus pressure  and sore throat.   Respiratory:  Negative for chest tightness, shortness of breath and wheezing.   Cardiovascular:  Negative for chest pain and palpitations.  Gastrointestinal:  Negative for abdominal distention, abdominal pain and constipation.  Genitourinary: Negative.   Musculoskeletal: Negative.   Psychiatric/Behavioral:  Negative for behavioral problems and dysphoric mood.     Objective:  BP (!) 169/73   Pulse 61   Temp 98.2 F (36.8 C) (Oral)   Ht _0  (1.727 m)   Wt 123 lb (55.8 kg)   SpO2 100%   BMI 18.70 kg/m      11/11/2022   10:19 AM 11/11/2022    9:17 AM 04/20/2022    2:24 PM  BP/Weight  Systolic BP 809 983 382  Diastolic BP 73 79 70  Wt. (Lbs)  123   BMI  18.7 kg/m2       Physical Exam Constitutional:      Appearance: He is well-developed.  Cardiovascular:     Rate and Rhythm: Normal rate.      Heart sounds: Normal heart sounds. No murmur heard.    Comments: Unable to palpate dorsalis pedis and posterior tibialis bilaterally Pulmonary:     Effort: Pulmonary effort is normal.     Breath sounds: Normal breath sounds. No wheezing or rales.  Chest:     Chest wall: No tenderness.  Abdominal:     General: Bowel sounds are normal. There is no distension.     Palpations: Abdomen is soft. There is no mass.     Tenderness: There is no abdominal tenderness.  Musculoskeletal:        General: Normal range of motion.     Right lower leg: No edema.     Left lower leg: No edema.  Neurological:     Mental Status: He is alert and oriented to person, place, and time.  Psychiatric:        Mood and Affect: Mood normal.        Latest Ref Rng & Units 01/25/2022    2:14 PM 04/28/2021    4:38 PM 11/12/2020   10:57 AM  CMP  Glucose 70 - 99 mg/dL 75  84  82   BUN 8 - 27 mg/dL _1 Creatinine 0.76 - 1.27 mg/dL 1.29  1.26  1.11   Sodium 134 - 144 mmol/L 144  145  141   Potassium 3.5 - 5.2 mmol/L 3.8  3.7  4.4   Chloride 96 - 106 mmol/L 101  106  104   CO2 20 - 29 mmol/L _2 Calcium 8.6 - 10.2 mg/dL 9.6  9.8  9.5   Total Protein 6.0 - 8.5 g/dL 7.3     Total Bilirubin 0.0 - 1.2 mg/dL 0.3     Alkaline Phos 44 - 121 IU/L 79     AST 0 - 40 IU/L 24     ALT 0 - 44 IU/L 14       Lipid Panel     Component Value Date/Time   CHOL 156 07/03/2019 1105   TRIG 67 07/03/2019 1105   HDL 54 07/03/2019 1105   CHOLHDL 2.9 07/03/2019 1105   CHOLHDL 3.4 12/16/2014 1249   VLDL 13 12/16/2014 1249   LDLCALC 89 07/03/2019 1105    CBC    Component Value Date/Time   WBC 7.2 01/25/2022 1414   WBC 5.9 10/30/2015 2131   RBC 4.31 01/25/2022 1414   RBC  4.36 10/30/2015 2131   HGB 10.4 (L) 01/25/2022 1414   HCT 32.7 (L) 01/25/2022 1414   PLT 356 01/25/2022 1414   MCV 76 (L) 01/25/2022 1414   MCH 24.1 (L) 01/25/2022 1414   MCH 28.4 10/30/2015 2131   MCHC 31.8 01/25/2022 1414   MCHC  34.1 10/30/2015 2131   RDW 15.9 (H) 01/25/2022 1414   LYMPHSABS 2.0 12/16/2014 1249   MONOABS 0.5 12/16/2014 1249   EOSABS 0.3 12/16/2014 1249   BASOSABS 0.1 12/16/2014 1249    Lab Results  Component Value Date   HGBA1C 5.5 12/16/2014   Assessment & Plan:  1. Essential hypertension Uncontrolled Increase dose of amlodipine Continue lisinopril/HCTZ Counseled on blood pressure goal of less than 130/80, low-sodium, DASH diet, medication compliance, 150 minutes of moderate intensity exercise per week. Discussed medication compliance, adverse effects. - CMP14+EGFR; Future - LP+Non-HDL Cholesterol; Future - amLODipine (NORVASC) 5 MG tablet; Take 1 tablet (5 mg total) by mouth daily.  Dispense: 90 tablet; Refill: 1  2. Screening for diabetes mellitus - Hemoglobin A1c; Future  3. PAD (peripheral artery disease) ([place]) He has intermittent claudication In office ABI reveals PAD and right leg, left leg 0.6 Risk factor modification including smoking cessation.  He is not ready to quit smoking. We will initiate statin - POCT ABI Screening for Pilot No Charge - atorvastatin (LIPITOR) 20 MG tablet; Take 1 tablet (20 mg total) by mouth daily.  Dispense: 30 tablet; Refill: 3 - Ambulatory referral to Vascular Surgery  4. Flu vaccine need - Flu Vaccine QUAD High Dose(Fluad)   Meds ordered this encounter  Medications   atorvastatin (LIPITOR) 20 MG tablet    Sig: Take 1 tablet (20 mg total) by mouth daily.    Dispense:  30 tablet    Refill:  3   amLODipine (NORVASC) 5 MG tablet    Sig: Take 1 tablet (5 mg total) by mouth daily.    Dispense:  90 tablet    Refill:  1    Dose increase    Follow-up: Return in about 3 months (around 02/11/2023) for Chronic medical conditions.       Charlott Rakes, MD, FAAFP. [facility], [place]   11/11/2022, 2:38 PM

## 2022-11-11 NOTE — Patient Instructions (Signed)
Please call to schedule your colonoscopy: [facility] [address] [place], [place] [address] PH# [phone] 

## 2022-11-22 ENCOUNTER — Ambulatory Visit (INDEPENDENT_AMBULATORY_CARE_PROVIDER_SITE_OTHER): Payer: Medicare (Managed Care) | Admitting: Vascular Surgery

## 2022-11-22 ENCOUNTER — Encounter: Payer: Self-pay | Admitting: Vascular Surgery

## 2022-11-22 VITALS — BP 170/86 | HR 71 | Temp 97.9°F | Resp 16 | Ht 68.5 in | Wt 121.0 lb

## 2022-11-22 DIAGNOSIS — I70219 Atherosclerosis of native arteries of extremities with intermittent claudication, unspecified extremity: Secondary | ICD-10-CM | POA: Insufficient documentation

## 2022-11-22 DIAGNOSIS — I70213 Atherosclerosis of native arteries of extremities with intermittent claudication, bilateral legs: Secondary | ICD-10-CM | POA: Diagnosis not present

## 2022-11-22 NOTE — Progress Notes (Signed)
Patient name: Martin Mason MRN: [id number] DOB: 1956/06/11 Sex: male  REASON FOR CONSULT: PAD on screening ABI  HPI: Martin Mason is a 66 y.o. male, with HTN and tobacco abuse that presents for evaluation of PAD based on a screening ABI.  Per the report his ABI on the left was 0.69 and his ABI on the right was PAD per the report.  Patient states he notices cramping in his calves when walking long distances.  He can walk a mile to the store before he has to stop.  Then he walks a mile home.  States he can run.  No pain at night.  No open wounds.  Down to smoking about 5 cigarettes a day (previously > 1PPD).  No previous vascular interventions.  Past Medical History:  Diagnosis Date   Asthma    childhood- young child to age 25   Hypertension     Past Surgical History:  Procedure Laterality Date   COLONOSCOPY     CYST EXCISION     throat   TOOTH EXTRACTION N/A 11/06/2020   Procedure: DENTAL RESTORATION/EXTRACTIONS;  Surgeon: Ocie Doyne, DDS;  Location: [facility] OR;  Service: Oral Surgery;  Laterality: N/A;    Family History  Problem Relation Age of Onset   Hypertension Mother    Cancer Mother    Hypertension Father    Diabetes Sister     SOCIAL HISTORY: Social History   Socioeconomic History   Marital status: Divorced    Spouse name: Not on file   Number of children: Not on file   Years of education: Not on file   Highest education level: Not on file  Occupational History   Not on file  Tobacco Use   Smoking status: Every Day    Packs/day: 0.12    Types: Cigarettes   Smokeless tobacco: Never   Tobacco comments:    3 daily  Vaping Use   Vaping Use: Never used  Substance and Sexual Activity   Alcohol use: No    Comment: quit drinking 7 years ago after 30 years of drinking   Drug use: Yes    Types: Marijuana    Comment: last time early November 2021   Sexual activity: Not on file  Other Topics Concern   Not on file  Social History Narrative   Not on file    Social Determinants of Health   Financial Resource Strain: Not on file  Food Insecurity: Not on file  Transportation Needs: Not on file  Physical Activity: Not on file  Stress: Not on file  Social Connections: Not on file  Intimate Partner Violence: Not on file    No Known Allergies  Current Outpatient Medications  Medication Sig Dispense Refill   amLODipine (NORVASC) 5 MG tablet Take 1 tablet (5 mg total) by mouth daily. 90 tablet 1   atorvastatin (LIPITOR) 20 MG tablet Take 1 tablet (20 mg total) by mouth daily. 30 tablet 3   EQ ALLERGY RELIEF, CETIRIZINE, 10 MG tablet Take 1 tablet by mouth once daily 90 tablet 1   ferrous sulfate 325 (65 FE) MG tablet Take 1 tablet (325 mg total) by mouth daily with breakfast. 90 tablet 1   fluticasone (FLONASE) 50 MCG/ACT nasal spray Use 2 spray(s) in each nostril once daily 16 g 2   lisinopril-hydrochlorothiazide (ZESTORETIC) 20-25 MG tablet Take 1 tablet by mouth once daily 90 tablet 0   sildenafil (VIAGRA) 50 MG tablet Take 1 tablet (50 mg total) by mouth  daily as needed for erectile dysfunction. At least 24 hours between doses 10 tablet 1   No current facility-administered medications for this visit.    REVIEW OF SYSTEMS:  [X]  denotes positive finding, [ ]  denotes negative finding Cardiac  Comments:  Chest pain or chest pressure:    Shortness of breath upon exertion:    Short of breath when lying flat:    Irregular heart rhythm:        Vascular    Pain in calf, thigh, or hip brought on by ambulation: x   Pain in feet at night that wakes you up from your sleep:     Blood clot in your veins:    Leg swelling:         Pulmonary    Oxygen at home:    Productive cough:     Wheezing:         Neurologic    Sudden weakness in arms or legs:     Sudden numbness in arms or legs:     Sudden onset of difficulty speaking or slurred speech:    Temporary loss of vision in one eye:     Problems with dizziness:         Gastrointestinal     Blood in stool:     Vomited blood:         Genitourinary    Burning when urinating:     Blood in urine:        Psychiatric    Major depression:         Hematologic    Bleeding problems:    Problems with blood clotting too easily:        Skin    Rashes or ulcers:        Constitutional    Fever or chills:      PHYSICAL EXAM: Vitals:   11/22/22 1557  BP: (!) 170/86  Pulse: 71  Resp: 16  Temp: 97.9 F (36.6 C)  TempSrc: Temporal  SpO2: 100%  Weight: 121 lb (54.9 kg)  Height: 5' 8.5" (1.74 m)    GENERAL: The patient is a well-nourished male, in no acute distress. The vital signs are documented above. CARDIAC: There is a regular rate and rhythm.  VASCULAR:  Palpable femoral pulses bilaterally No palpable pedal pulses No lower extremity tissue loss PULMONARY: No respiratory distress. ABDOMEN: Soft and non-tender. MUSCULOSKELETAL: There are no major deformities or cyanosis. NEUROLOGIC: No focal weakness or paresthesias are detected. SKIN: There are no ulcers or rashes noted. PSYCHIATRIC: The patient has a normal affect.  DATA:   ABI on 11/11/22 on the left was 0.69 and on the right was PAD per the report  Assessment/Plan:  66 year old male presents for evaluation of PAD after screening ABIs.  Per the report his ABI on the right was PAD and his ABI on the left was 0.69.  He endorses bilateral calf claudication but only after walking 1 mile to the store and then he can walk 1 mile back home.  He also states he can run.  Discussed that for vascular claudication we manage this with lifestyle modification until it becomes lifestyle limiting.  I have recommended he start a 81 mg aspirin in addition to his statin therapy.  Discussed smoking cessation.  Discussed walking therapies which he is already doing.  I did offer to prescribe Pletal but he does not want any additional medications at this time.  I will follow-up with him in 1 year with repeat  ABIs.  I do not believe his  claudication is disabling at this time.  No signs of critical limb ischemia like tissue loss or rest pain.  [name], MD [facility] Office: [phone]

## 2023-01-09 ENCOUNTER — Other Ambulatory Visit: Payer: Self-pay | Admitting: Family Medicine

## 2023-01-09 DIAGNOSIS — R430 Anosmia: Secondary | ICD-10-CM

## 2023-01-09 DIAGNOSIS — I739 Peripheral vascular disease, unspecified: Secondary | ICD-10-CM

## 2023-01-09 DIAGNOSIS — I10 Essential (primary) hypertension: Secondary | ICD-10-CM

## 2023-01-09 DIAGNOSIS — D649 Anemia, unspecified: Secondary | ICD-10-CM

## 2023-01-09 MED ORDER — LISINOPRIL-HYDROCHLOROTHIAZIDE 20-25 MG PO TABS: 1.0000 | ORAL_TABLET | Freq: Every day | ORAL | 0 refills | Status: DC

## 2023-01-09 MED ORDER — FLUTICASONE PROPIONATE 50 MCG/ACT NA SUSP: NASAL | 2 refills | Status: DC

## 2023-01-09 MED ORDER — FERROUS SULFATE 325 (65 FE) MG PO TABS: 325.0000 mg | ORAL_TABLET | Freq: Every day | ORAL | 0 refills | Status: DC

## 2023-01-09 NOTE — Telephone Encounter (Signed)
[facility] called and spoke to [place], [employer]chant [employer] officer about the refill(s) requested. She says the patient already called in the refills and the atorvastatin and cetirizine. She said he was also calling in a refill of amlodipine 2.5 mg and they have 5 mg on file and that he picked up the 5 mg in November, so it's not due until 01/17/23. I advised I will call him to discuss. Patient called and advised on 11/11/22 the Amlodipine was increased from 2.5 mg to 5 mg and that he picked up bottle in November. I asked him to look at all his medication bottles. He spelled out amlodipine 2.5 and the 5 mg bottle, atorvastatin, cetirizine, saying that's all the bottles he has and he takes. I advised to throw away the amlodipine 2.5 mg and take the 5 mg in place of it, he verbalized understanding. I also advised that he's supposed to be taking lisinopril-HCTZ, he says he doesn't have that bottle. I advised I will send this refill in along with the ferrous sulfate and flonase nasal spray. He says he has a 30 day of atorvastatin and would like a 90 day so that he doesn't have to catch the bus over there every month to the pharmacy. I advised I will send the request through. He verbalized understanding.

## 2023-01-09 NOTE — Telephone Encounter (Signed)
Medication Refill - Medication:   cetirizine (ZYRTEC) 10 MG tablet [[id number]]  amLODipine (NORVASC) 2.5 MG tablet  atorvastatin (LIPITOR) 20 MG tablet  fluticasone (FLONASE) 50 MCG/ACT nasal spray   Has the patient contacted their pharmacy? Yes.   (Agent: If no, request that the patient contact the pharmacy for the refill. If patient does not wish to contact the pharmacy document the reason why and proceed with request.) (Agent: If yes, when and what did the pharmacy advise?)  Preferred Pharmacy (with phone number or street name):   [facility] ([place]), [place] - [address]  [address] [place] [place] [address]  Phone: [phone] Fax: [phone]    Has the patient been seen for an appointment in the last year OR does the patient have an upcoming appointment? Yes.    Agent: Please be advised that RX refills may take up to 3 business days. We ask that you follow-up with your pharmacy.

## 2023-01-09 NOTE — Telephone Encounter (Signed)
Requested medication (s) are due for refill today: Yes  Requested medication (s) are on the active medication list: Yes  Last refill:  11/11/22  Future visit scheduled: {Yes  Notes to clinic:  Unable to refill per protocol due to failed labs, no updated results. Patient requesting a 90 day supply.     Requested Prescriptions  Pending Prescriptions Disp Refills   atorvastatin (LIPITOR) 20 MG tablet 30 tablet 3    Sig: Take 1 tablet (20 mg total) by mouth daily.     Cardiovascular:  Antilipid - Statins Failed - 01/09/2023 11:07 AM      Failed - Lipid Panel in normal range within the last 12 months    Cholesterol, Total  Date Value Ref Range Status  07/03/2019 156 100 - 199 mg/dL Final   LDL Calculated  Date Value Ref Range Status  07/03/2019 89 0 - 99 mg/dL Final   HDL  Date Value Ref Range Status  07/03/2019 54 >39 mg/dL Final   Triglycerides  Date Value Ref Range Status  07/03/2019 67 0 - 149 mg/dL Final         Passed - Patient is not pregnant      Passed - Valid encounter within last 12 months    Recent Outpatient Visits           1 month ago Essential hypertension   [place], Charlane Ferretti, MD   8 months ago [place], Enobong, MD   10 months ago Essential hypertension   [place], Jarome Matin, RPH-CPP   11 months ago Essential hypertension   [place], Jarome Matin, RPH-CPP   11 months ago Essential hypertension   [place], MD       Future Appointments             In 1 month Charlott Rakes, MD [place]            Signed Prescriptions Disp Refills   fluticasone (FLONASE) 50 MCG/ACT nasal spray 16 g 2    Sig: Use 2 spray(s) in each nostril once daily     Ear, Nose, and Throat: Nasal Preparations -  Corticosteroids Passed - 01/09/2023 11:07 AM      Passed - Valid encounter within last 12 months    Recent Outpatient Visits           1 month ago Essential hypertension   [place], Enobong, MD   8 months ago [place], Enobong, MD   10 months ago Essential hypertension   [place], Jarome Matin, RPH-CPP   11 months ago Essential hypertension   [place], Jarome Matin, RPH-CPP   11 months ago Essential hypertension   [place], MD       Future Appointments             In 1 month Charlott Rakes, MD [place]             lisinopril-hydrochlorothiazide (ZESTORETIC) 20-25 MG tablet 90 tablet 0    Sig: Take 1 tablet by mouth daily.     Cardiovascular:  ACEI +  Diuretic Combos Failed - 01/09/2023 11:07 AM      Failed - Na in normal range and within 180 days    Sodium  Date Value Ref Range Status  01/25/2022 144 134 - 144 mmol/L Final         Failed - K in normal range and within 180 days    Potassium  Date Value Ref Range Status  01/25/2022 3.8 3.5 - 5.2 mmol/L Final         Failed - Cr in normal range and within 180 days    Creat  Date Value Ref Range Status  03/01/2017 1.24 0.70 - 1.25 mg/dL Final    Comment:      For patients > or = 69 years of age: The upper reference limit for Creatinine is approximately 13% higher for people identified as African-American.      Creatinine, Ser  Date Value Ref Range Status  01/25/2022 1.29 (H) 0.76 - 1.27 mg/dL Final         Failed - eGFR is 30 or above and within 180 days    GFR, Est African American  Date Value Ref Range Status  03/01/2017 72 >=60 mL/min Final   GFR calc Af Amer  Date Value Ref Range Status  11/12/2020 80 >59 mL/min/1.73 Final    Comment:    **In  accordance with recommendations from the NKF-ASN Task force,**   [facility] is in the process of updating its eGFR calculation to the   2021 CKD-EPI creatinine equation that estimates kidney function   without a race variable.    GFR, Est Non African American  Date Value Ref Range Status  03/01/2017 62 >=60 mL/min Final   GFR calc non Af Amer  Date Value Ref Range Status  11/12/2020 69 >59 mL/min/1.73 Final   eGFR  Date Value Ref Range Status  01/25/2022 61 >59 mL/min/1.73 Final         Failed - Last BP in normal range    BP Readings from Last 1 Encounters:  11/22/22 (!) 170/86         Passed - Patient is not pregnant      Passed - Valid encounter within last 6 months    Recent Outpatient Visits           1 month ago Essential hypertension   [place], Stutsman, MD   8 months ago [place], Enobong, MD   10 months ago Essential hypertension   [place], Jarome Matin, RPH-CPP   11 months ago Essential hypertension   [place], Jarome Matin, RPH-CPP   11 months ago Essential hypertension   [place], Dalbert Batman, MD       Future Appointments             In 1 month Charlott Rakes, MD [place]             ferrous sulfate 325 (65 FE) MG tablet 90 tablet 0    Sig: Take 1 tablet (325 mg total) by mouth daily with breakfast.     Endocrinology:  Minerals - Iron Supplementation Failed - 01/09/2023 11:07 AM      Failed - HGB in normal range and within 360 days    Hemoglobin  Date Value Ref Range Status  01/25/2022 10.4 (L)  13.0 - 17.7 g/dL Final         Failed - HCT in normal range and within 360 days    Hematocrit  Date Value Ref Range Status  01/25/2022 32.7 (L) 37.5 - 51.0 % Final         Failed - Fe (serum) in normal  range and within 360 days    Iron  Date Value Ref Range Status  01/25/2022 29 (L) 38 - 169 ug/dL Final   Iron Saturation  Date Value Ref Range Status  01/25/2022 8 (LL) 15 - 55 % Final         Failed - Ferritin in normal range and within 360 days    Ferritin  Date Value Ref Range Status  01/25/2022 15 (L) 30 - 400 ng/mL Final         Passed - RBC in normal range and within 360 days    RBC  Date Value Ref Range Status  01/25/2022 4.31 4.14 - 5.80 x10E6/uL Final  10/30/2015 4.36 4.22 - 5.81 MIL/uL Final         Passed - Valid encounter within last 12 months    Recent Outpatient Visits           1 month ago Essential hypertension   [facility] [place], Odette Horns, MD   8 months ago Anosmia   [facility] Hoy Register, MD   10 months ago Essential hypertension   [facility] [place], Cornelius Moras, RPH-CPP   11 months ago Essential hypertension   [facility] [place], Cornelius Moras, RPH-CPP   11 months ago Essential hypertension   [facility] Marcine Matar, MD       Future Appointments             In 1 month Hoy Register, MD [facility]

## 2023-01-10 MED ORDER — ATORVASTATIN CALCIUM 20 MG PO TABS: 20.0000 mg | ORAL_TABLET | Freq: Every day | ORAL | 0 refills | Status: DC

## 2023-02-06 ENCOUNTER — Ambulatory Visit: Payer: Medicare (Managed Care) | Attending: Family Medicine

## 2023-02-06 DIAGNOSIS — Z0000 Encounter for general adult medical examination without abnormal findings: Secondary | ICD-10-CM

## 2023-02-06 DIAGNOSIS — Z1211 Encounter for screening for malignant neoplasm of colon: Secondary | ICD-10-CM

## 2023-02-06 NOTE — Patient Instructions (Signed)
Martin Mason , Thank you for taking time to come for your Medicare Wellness Visit. I appreciate your ongoing commitment to your health goals. Please review the following plan we discussed and let me know if I can assist you in the future.   These are the goals we discussed:  Goals      Quit Smoking        This is a list of the screening recommended for you and due dates:  Health Maintenance  Topic Date Due   COVID-19 Vaccine (1) Never done   Colon Cancer Screening  Never done   Zoster (Shingles) Vaccine (1 of 2) Never done   Pneumonia Vaccine (1 of 2 - PCV) 04/21/2023*   Medicare Annual Wellness Visit  02/07/2024   DTaP/Tdap/Td vaccine (2 - Td or Tdap) 10/10/2028   Flu Shot  Completed   Hepatitis C Screening: USPSTF Recommendation to screen - Ages 18-79 yo.  Completed   HPV Vaccine  Aged Out  *Topic was postponed. The date shown is not the original due date.   Health Maintenance After Age 39 After age 33, you are at a higher risk for certain long-term diseases and infections as well as injuries from falls. Falls are a major cause of broken bones and head injuries in people who are older than age 64. Getting regular preventive care can help to keep you healthy and well. Preventive care includes getting regular testing and making lifestyle changes as recommended by your health care provider. Talk with your health care provider about: Which screenings and tests you should have. A screening is a test that checks for a disease when you have no symptoms. A diet and exercise plan that is right for you. What should I know about screenings and tests to prevent falls? Screening and testing are the best ways to find a health problem early. Early diagnosis and treatment give you the best chance of managing medical conditions that are common after age 54. Certain conditions and lifestyle choices may make you more likely to have a fall. Your health care provider may recommend: Regular vision checks.  Poor vision and conditions such as cataracts can make you more likely to have a fall. If you wear glasses, make sure to get your prescription updated if your vision changes. Medicine review. Work with your health care provider to regularly review all of the medicines you are taking, including over-the-counter medicines. Ask your health care provider about any side effects that may make you more likely to have a fall. Tell your health care provider if any medicines that you take make you feel dizzy or sleepy. Strength and balance checks. Your health care provider may recommend certain tests to check your strength and balance while standing, walking, or changing positions. Foot health exam. Foot pain and numbness, as well as not wearing proper footwear, can make you more likely to have a fall. Screenings, including: Osteoporosis screening. Osteoporosis is a condition that causes the bones to get weaker and break more easily. Blood pressure screening. Blood pressure changes and medicines to control blood pressure can make you feel dizzy. Depression screening. You may be more likely to have a fall if you have a fear of falling, feel depressed, or feel unable to do activities that you used to do. Alcohol use screening. Using too much alcohol can affect your balance and may make you more likely to have a fall. Follow these instructions at home: Lifestyle Do not drink alcohol if: Your health care  provider tells you not to drink. If you drink alcohol: Limit how much you have to: 0-1 drink a day for women. 0-2 drinks a day for men. Know how much alcohol is in your drink. In the [place] one drink equals one 12 oz bottle of beer (355 mL), one 5 oz glass of wine (148 mL), or one 1 oz glass of hard liquor (44 mL). Do not use any products that contain nicotine or tobacco. These products include cigarettes, chewing tobacco, and vaping devices, such as e-cigarettes. If you need help quitting, ask your health care  provider. Activity  Follow a regular exercise program to stay fit. This will help you maintain your balance. Ask your health care provider what types of exercise are appropriate for you. If you need a cane or walker, use it as recommended by your health care provider. Wear supportive shoes that have nonskid soles. Safety  Remove any tripping hazards, such as rugs, cords, and clutter. Install safety equipment such as grab bars in bathrooms and safety rails on stairs. Keep rooms and walkways well-lit. General instructions Talk with your health care provider about your risks for falling. Tell your health care provider if: You fall. Be sure to tell your health care provider about all falls, even ones that seem minor. You feel dizzy, tiredness (fatigue), or off-balance. Take over-the-counter and prescription medicines only as told by your health care provider. These include supplements. Eat a healthy diet and maintain a healthy weight. A healthy diet includes low-fat dairy products, low-fat (lean) meats, and fiber from whole grains, beans, and lots of fruits and vegetables. Stay current with your vaccines. Schedule regular health, dental, and eye exams. Summary Having a healthy lifestyle and getting preventive care can help to protect your health and wellness after age 58. Screening and testing are the best way to find a health problem early and help you avoid having a fall. Early diagnosis and treatment give you the best chance for managing medical conditions that are more common for people who are older than age 79. Falls are a major cause of broken bones and head injuries in people who are older than age 100. Take precautions to prevent a fall at home. Work with your health care provider to learn what changes you can make to improve your health and wellness and to prevent falls. This information is not intended to replace advice given to you by your health care provider. Make sure you discuss  any questions you have with your health care provider. Document Revised: 05/03/2021 Document Reviewed: 05/03/2021 Elsevier Patient Education  Martin Mason.

## 2023-02-06 NOTE — Progress Notes (Signed)
Subjective:   Martin Mason is a 67 y.o. male who presents for Medicare Annual/Subsequent preventive examination.  Review of Systems     I connected with Martin Mason on 02/06/2023 at 2:16 pm by telephone and verified that I am speaking with the correct person using two identifiers. I discussed the limitations, risks, security and privacy concerns of performing an evaluation and management service by telephone and the availability of in person appointments. I also discussed with the patient that there may be a patient responsible charge related to this service. The patient expressed understanding and agreed to proceed.   Patient location: Home My Location: [place] on the telephone call: Myself and Patinet     Cardiac Risk Factors include: none     Objective:    There were no vitals filed for this visit. There is no height or weight on file to calculate BMI.     02/06/2023    2:12 PM 11/06/2020    8:34 AM 10/10/2018    1:54 PM 08/30/2017    2:25 PM 03/01/2017   11:45 AM 11/06/2015    2:38 PM  Advanced Directives  Does Patient Have a Medical Advance Directive? No No No No No No  Would patient like information on creating a medical advance directive? Yes (ED - Information included in AVS) No - Patient declined No - Patient declined  No - Patient declined No - patient declined information    Current Medications (verified) Outpatient Encounter Medications as of 02/06/2023  Medication Sig   amLODipine (NORVASC) 5 MG tablet Take 1 tablet (5 mg total) by mouth daily.   atorvastatin (LIPITOR) 20 MG tablet Take 1 tablet (20 mg total) by mouth daily.   EQ ALLERGY RELIEF, CETIRIZINE, 10 MG tablet Take 1 tablet by mouth once daily   ferrous sulfate 325 (65 FE) MG tablet Take 1 tablet (325 mg total) by mouth daily with breakfast.   fluticasone (FLONASE) 50 MCG/ACT nasal spray Use 2 spray(s) in each nostril once daily   lisinopril-hydrochlorothiazide (ZESTORETIC) 20-25  MG tablet Take 1 tablet by mouth daily.   sildenafil (VIAGRA) 50 MG tablet Take 1 tablet (50 mg total) by mouth daily as needed for erectile dysfunction. At least 24 hours between doses   No facility-administered encounter medications on file as of 02/06/2023.    Allergies (verified) Patient has no known allergies.   History: Past Medical History:  Diagnosis Date   Asthma    childhood- young child to age 75   Hypertension    Past Surgical History:  Procedure Laterality Date   COLONOSCOPY     CYST EXCISION     throat   TOOTH EXTRACTION N/A 11/06/2020   Procedure: DENTAL RESTORATION/EXTRACTIONS;  Surgeon: Diona Browner, DDS;  Location: [facility];  Service: Oral Surgery;  Laterality: N/A;   Family History  Problem Relation Age of Onset   Hypertension Mother    Cancer Mother    Hypertension Father    Diabetes Sister    Social History   Socioeconomic History   Marital status: Divorced    Spouse name: Not on file   Number of children: Not on file   Years of education: Not on file   Highest education level: Not on file  Occupational History   Not on file  Tobacco Use   Smoking status: Every Day    Packs/day: 0.12    Types: Cigarettes   Smokeless tobacco: Never   Tobacco comments:    3  daily  Vaping Use   Vaping Use: Never used  Substance and Sexual Activity   Alcohol use: No    Comment: quit drinking 7 years ago after 30 years of drinking   Drug use: Yes    Types: Marijuana    Comment: last time early November 2021   Sexual activity: Not on file  Other Topics Concern   Not on file  Social History Narrative   Not on file   Social Determinants of Health   Financial Resource Strain: Low Risk  (02/06/2023)   Overall Financial Resource Strain (CARDIA)    Difficulty of Paying Living Expenses: Not hard at all  Food Insecurity: No Food Insecurity (02/06/2023)   Hunger Vital Sign    Worried About Running Out of Food in the Last Year: Never true    Ran Out of Food in the  Last Year: Never true  Transportation Needs: No Transportation Needs (02/06/2023)   PRAPARE - Hydrologist (Medical): No    Lack of Transportation (Non-Medical): No  Physical Activity: Sufficiently Active (02/06/2023)   Exercise Vital Sign    Days of Exercise per Week: 7 days    Minutes of Exercise per Session: 30 min  Stress: No Stress Concern Present (02/06/2023)   [place]    Feeling of Stress : Not at all  Social Connections: Moderately Isolated (02/06/2023)   Social Connection and Isolation Panel [NHANES]    Frequency of Communication with Friends and Family: Three times a week    Frequency of Social Gatherings with Friends and Family: Three times a week    Attends Religious Services: 1 to 4 times per year    Active Member of Clubs or Organizations: No    Attends Archivist Meetings: Never    Marital Status: Divorced    Tobacco Counseling Ready to quit: Yes Counseling given: Yes Tobacco comments: 3 daily   Clinical Intake:  Pre-visit preparation completed: No  Pain : No/denies pain     Nutritional Risks: None Diabetes: No  How often do you need to have someone help you when you read instructions, pamphlets, or other written materials from your doctor or pharmacy?: 1 - Never  Diabetic?No  Interpreter Needed?: No      Activities of Daily Living    02/06/2023    2:13 PM  In your present state of health, do you have any difficulty performing the following activities:  Hearing? 0  Vision? 0  Difficulty concentrating or making decisions? 0  Walking or climbing stairs? 0  Dressing or bathing? 0  Doing errands, shopping? 0  Preparing Food and eating ? N  Using the Toilet? N  In the past six months, have you accidently leaked urine? N  Do you have problems with loss of bowel control? N  Managing your Medications? N  Managing your Finances? N  Housekeeping  or managing your Housekeeping? N    Patient Care Team: Charlott Rakes, MD as PCP - General (Family Medicine)  Indicate any recent Medical Services you may have received from other than [facility] providers in the past year (date may be approximate).     Assessment:   This is a routine wellness examination for Martin Mason.  Hearing/Vision screen No results found.  Dietary issues and exercise activities discussed: Current Exercise Habits: Home exercise routine, Type of exercise: walking, Time (Minutes): 30, Frequency (Times/Week): 7, Weekly Exercise (Minutes/Week): 210, Intensity: Mild, Exercise limited by:  None identified   Goals Addressed   None    Depression Screen    02/06/2023    2:12 PM 11/11/2022    9:22 AM 04/20/2022    2:36 PM 01/25/2022    1:35 PM 03/31/2020    1:56 PM 01/08/2019   11:36 AM 09/20/2018    4:48 PM  PHQ 2/9 Scores  PHQ - 2 Score 0 0 0 0 0 0 3  PHQ- 9 Score  0 0  0  3    Fall Risk    02/06/2023    2:12 PM 11/11/2022    9:22 AM 04/20/2022    2:05 PM 01/25/2022    1:35 PM 03/31/2020    1:56 PM  [place] in the past year? 0 0 0 0 0  Number falls in past yr: 0 0 0 0   Injury with Fall? 0 0 0 0   Risk for fall due to : No Fall Risks No Fall Risks  No Fall Risks   Follow up Falls prevention discussed        FALL RISK PREVENTION PERTAINING TO THE HOME:  Any stairs in or around the home? No  If so, are there any without handrails? No  Home free of loose throw rugs in walkways, pet beds, electrical cords, etc? Yes  Adequate lighting in your home to reduce risk of falls? Yes   ASSISTIVE DEVICES UTILIZED TO PREVENT FALLS:  Life alert? No  Use of a cane, walker or w/c? No  Grab bars in the bathroom? No  Shower chair or bench in shower? No  Elevated toilet seat or a handicapped toilet? No   TIMED UP AND GO:  Was the test performed? No .  Length of time to ambulate 10 feet: n/a sec.   Gait steady and fast without use of assistive device  Cognitive  Function:    02/06/2023    2:13 PM  MMSE - Mini Mental State Exam  Not completed: Unable to complete        02/06/2023    2:14 PM  6CIT Screen  What Year? 0 points  What month? 0 points  What time? 0 points  Count back from 20 0 points    Immunizations Immunization History  Administered Date(s) Administered   Fluad Quad(high Dose 65+) 11/11/2022   Influenza Whole 10/26/2016   Influenza,inj,Quad PF,6+ Mos 11/28/2013, 12/16/2014, 11/06/2015, 09/20/2018, 10/01/2019, 11/12/2020, 01/25/2022   Tdap 10/10/2018    TDAP status: Up to date  Flu Vaccine status: Up to date  Pneumococcal vaccine status: Declined,  Education has been provided regarding the importance of this vaccine but patient still declined. Advised may receive this vaccine at local pharmacy or Health Dept. Aware to provide a copy of the vaccination record if obtained from local pharmacy or Health Dept. Verbalized acceptance and understanding.   Covid-19 vaccine status: Completed vaccines  Qualifies for Shingles Vaccine? Yes   Zostavax completed  1st vaccine has been completed   Shingrix Completed?: No.    Education has been provided regarding the importance of this vaccine. Patient has been advised to call insurance company to determine out of pocket expense if they have not yet received this vaccine. Advised may also receive vaccine at local pharmacy or Health Dept. Verbalized acceptance and understanding.  Screening Tests Health Maintenance  Topic Date Due   Medicare Annual Wellness (AWV)  Never done   COVID-19 Vaccine (1) Never done   COLONOSCOPY (Pts 45-71yr Insurance coverage will  need to be confirmed)  Never done   Zoster Vaccines- Shingrix (1 of 2) Never done   Pneumonia Vaccine 14+ Years old (1 of 2 - PCV) 04/21/2023 (Originally 11/17/1962)   DTaP/Tdap/Td (2 - Td or Tdap) 10/10/2028   INFLUENZA VACCINE  Completed   Hepatitis C Screening  Completed   HPV VACCINES  Aged Out    Health  Maintenance  Health Maintenance Due  Topic Date Due   Medicare Annual Wellness (AWV)  Never done   COVID-19 Vaccine (1) Never done   COLONOSCOPY (Pts 45-39yr Insurance coverage will need to be confirmed)  Never done   Zoster Vaccines- Shingrix (1 of 2) Never done    Colorectal cancer screening: Referral to GI placed 02/06/2023. Pt aware the office will call re: appt.  Lung Cancer Screening: (Low Dose CT Chest recommended if Age 452-80years, 30 pack-year currently smoking OR have quit w/in 15years.) does not qualify.   Lung Cancer Screening Referral: No  Additional Screening:  Hepatitis C Screening:  does qualify; completed: 02/23/2018  Vision Screening: Recommended annual ophthalmology exams for early detection of glaucoma and other disorders of the eye. Is the patient up to date with their annual eye exam?  Yes  Who is the provider or what is the name of the office in which the patient attends annual eye exams? N/a If pt is not established with a provider, would they like to be referred to a provider to establish care? No .   Dental Screening: Recommended annual dental exams for proper oral hygiene  Community Resource Referral / Chronic Care Management: CRR required this visit?  No   CCM required this visit?  No      Plan:     I have personally reviewed and noted the following in the patient's chart:   Medical and social history Use of alcohol, tobacco or illicit drugs  Current medications and supplements including opioid prescriptions. Patient is not currently taking opioid prescriptions. Functional ability and status Nutritional status Physical activity Advanced directives List of other physicians Hospitalizations, surgeries, and ER visits in previous 12 months Vitals Screenings to include cognitive, depression, and falls Referrals and appointments  In addition, I have reviewed and discussed with patient certain preventive protocols, quality metrics, and best  practice recommendations. A written personalized care plan for preventive services as well as general preventive health recommendations were provided to patient.     AGomez Cleverly [place]  02/06/2023   Nurse Notes: I spent 30 minutes on this telephone encounter AVS mailed to patinet

## 2023-02-14 ENCOUNTER — Encounter: Payer: Self-pay | Admitting: Family Medicine

## 2023-02-14 ENCOUNTER — Ambulatory Visit: Payer: Medicare (Managed Care) | Attending: Family Medicine | Admitting: Family Medicine

## 2023-02-14 VITALS — BP 158/76 | HR 76 | Temp 98.2°F | Ht 68.0 in | Wt 128.6 lb

## 2023-02-14 DIAGNOSIS — F17200 Nicotine dependence, unspecified, uncomplicated: Secondary | ICD-10-CM

## 2023-02-14 DIAGNOSIS — I10 Essential (primary) hypertension: Secondary | ICD-10-CM

## 2023-02-14 DIAGNOSIS — I739 Peripheral vascular disease, unspecified: Secondary | ICD-10-CM | POA: Diagnosis not present

## 2023-02-14 MED ORDER — AMLODIPINE BESYLATE 10 MG PO TABS: 10.0000 mg | ORAL_TABLET | Freq: Every day | ORAL | 1 refills | Status: DC

## 2023-02-14 MED ORDER — LISINOPRIL-HYDROCHLOROTHIAZIDE 20-25 MG PO TABS: 1.0000 | ORAL_TABLET | Freq: Every day | ORAL | 1 refills | Status: DC

## 2023-02-14 MED ORDER — ATORVASTATIN CALCIUM 20 MG PO TABS: 20.0000 mg | ORAL_TABLET | Freq: Every day | ORAL | 1 refills | Status: DC

## 2023-02-14 NOTE — Progress Notes (Signed)
Stopped iron pill due to nausea.

## 2023-02-14 NOTE — Progress Notes (Signed)
Subjective:  Patient ID: Martin Mason, male    DOB: 10/27/1956  Age: 67 y.o. MRN: [id number]  CC: Hypertension   HPI Martin Mason is a 67 y.o. year old male with a history of hypertension, PAD, tobacco abuse here for a follow-up visit.    Interval History:  He had a visit with vascular surgery in 10/2022) notes his symptoms were not disabling and he was advised to follow-up in 1 year and continue aspirin. His blood pressure is elevated and his amlodipine dose had been increased at his last office visit.  He endorses adherence with both antihypertensives as well as his statin. Smokes 5 Cigarettes /day and is not ready to quit.  Past Medical History:  Diagnosis Date   Asthma    childhood- young child to age 34   Hypertension     Past Surgical History:  Procedure Laterality Date   COLONOSCOPY     CYST EXCISION     throat   TOOTH EXTRACTION N/A 11/06/2020   Procedure: DENTAL RESTORATION/EXTRACTIONS;  Surgeon: Diona Browner, DDS;  Location: [facility];  Service: Oral Surgery;  Laterality: N/A;    Family History  Problem Relation Age of Onset   Hypertension Mother    Cancer Mother    Hypertension Father    Diabetes Sister     Social History   Socioeconomic History   Marital status: Divorced    Spouse name: Not on file   Number of children: Not on file   Years of education: Not on file   Highest education level: Not on file  Occupational History   Not on file  Tobacco Use   Smoking status: Every Day    Packs/day: 0.12    Types: Cigarettes   Smokeless tobacco: Never   Tobacco comments:    3 daily  Vaping Use   Vaping Use: Never used  Substance and Sexual Activity   Alcohol use: No    Comment: quit drinking 7 years ago after 30 years of drinking   Drug use: Yes    Types: Marijuana    Comment: last time early November 2021   Sexual activity: Not on file  Other Topics Concern   Not on file  Social History Narrative   Not on file   Social Determinants of Health    Financial Resource Strain: Low Risk  (02/06/2023)   Overall Financial Resource Strain (CARDIA)    Difficulty of Paying Living Expenses: Not hard at all  Food Insecurity: No Food Insecurity (02/06/2023)   Hunger Vital Sign    Worried About Running Out of Food in the Last Year: Never true    [place] in the Last Year: Never true  Transportation Needs: No Transportation Needs (02/06/2023)   PRAPARE - Hydrologist (Medical): No    Lack of Transportation (Non-Medical): No  Physical Activity: Sufficiently Active (02/06/2023)   Exercise Vital Sign    Days of Exercise per Week: 7 days    Minutes of Exercise per Session: 30 min  Stress: No Stress Concern Present (02/06/2023)   [place]    Feeling of Stress : Not at all  Social Connections: Moderately Isolated (02/06/2023)   Social Connection and Isolation Panel [NHANES]    Frequency of Communication with Friends and Family: Three times a week    Frequency of Social Gatherings with Friends and Family: Three times a week    Attends Religious Services: 1  to 4 times per year    Active Member of Clubs or Organizations: No    Attends Archivist Meetings: Never    Marital Status: Divorced    No Known Allergies  Outpatient Medications Prior to Visit  Medication Sig Dispense Refill   EQ ALLERGY RELIEF, CETIRIZINE, 10 MG tablet Take 1 tablet by mouth once daily 90 tablet 1   fluticasone (FLONASE) 50 MCG/ACT nasal spray Use 2 spray(s) in each nostril once daily 16 g 2   sildenafil (VIAGRA) 50 MG tablet Take 1 tablet (50 mg total) by mouth daily as needed for erectile dysfunction. At least 24 hours between doses 10 tablet 1   amLODipine (NORVASC) 5 MG tablet Take 1 tablet (5 mg total) by mouth daily. 90 tablet 1   atorvastatin (LIPITOR) 20 MG tablet Take 1 tablet (20 mg total) by mouth daily. 30 tablet 0   lisinopril-hydrochlorothiazide  (ZESTORETIC) 20-25 MG tablet Take 1 tablet by mouth daily. 90 tablet 0   ferrous sulfate 325 (65 FE) MG tablet Take 1 tablet (325 mg total) by mouth daily with breakfast. (Patient not taking: Reported on 02/14/2023) 90 tablet 0   No facility-administered medications prior to visit.     ROS Review of Systems  Constitutional:  Negative for activity change and appetite change.  HENT:  Negative for sinus pressure and sore throat.   Respiratory:  Negative for chest tightness, shortness of breath and wheezing.   Cardiovascular:  Negative for chest pain and palpitations.  Gastrointestinal:  Negative for abdominal distention, abdominal pain and constipation.  Genitourinary: Negative.   Musculoskeletal: Negative.   Psychiatric/Behavioral:  Negative for behavioral problems and dysphoric mood.     Objective:  BP (!) 158/76   Pulse 76   Temp 98.2 F (36.8 C) (Oral)   Ht 5' 8"$  (1.727 m)   Wt 128 lb 9.6 oz (58.3 kg)   SpO2 100%   BMI 19.55 kg/m      02/14/2023    2:01 PM 11/22/2022    3:57 PM 11/11/2022   10:19 AM  BP/Weight  Systolic BP 0000000 [id number] [id number]  Diastolic BP 76 86 73  Wt. (Lbs) 128.6 121   BMI 19.55 kg/m2 18.13 kg/m2       Physical Exam Constitutional:      Appearance: He is well-developed.  Cardiovascular:     Rate and Rhythm: Normal rate.     Heart sounds: Normal heart sounds. No murmur heard. Pulmonary:     Effort: Pulmonary effort is normal.     Breath sounds: Normal breath sounds. No wheezing or rales.  Chest:     Chest wall: No tenderness.  Abdominal:     General: Bowel sounds are normal. There is no distension.     Palpations: Abdomen is soft. There is no mass.     Tenderness: There is no abdominal tenderness.  Musculoskeletal:        General: Normal range of motion.     Right lower leg: No edema.     Left lower leg: No edema.  Neurological:     Mental Status: He is alert and oriented to person, place, and time.  Psychiatric:        Mood and Affect: Mood  normal.        Latest Ref Rng & Units 01/25/2022    2:14 PM 04/28/2021    4:38 PM 11/12/2020   10:57 AM  CMP  Glucose 70 - 99 mg/dL 75  84  82  BUN 8 - 27 mg/dL 14  13  6   $ Creatinine 0.76 - 1.27 mg/dL 1.29  1.26  1.11   Sodium 134 - 144 mmol/L 144  145  141   Potassium 3.5 - 5.2 mmol/L 3.8  3.7  4.4   Chloride 96 - 106 mmol/L 101  106  104   CO2 20 - 29 mmol/L 23  24  24   $ Calcium 8.6 - 10.2 mg/dL 9.6  9.8  9.5   Total Protein 6.0 - 8.5 g/dL 7.3     Total Bilirubin 0.0 - 1.2 mg/dL 0.3     Alkaline Phos 44 - 121 IU/L 79     AST 0 - 40 IU/L 24     ALT 0 - 44 IU/L 14       Lipid Panel     Component Value Date/Time   CHOL 156 07/03/2019 1105   TRIG 67 07/03/2019 1105   HDL 54 07/03/2019 1105   CHOLHDL 2.9 07/03/2019 1105   CHOLHDL 3.4 12/16/2014 1249   VLDL 13 12/16/2014 1249   LDLCALC 89 07/03/2019 1105    CBC    Component Value Date/Time   WBC 7.2 01/25/2022 1414   WBC 5.9 10/30/2015 2131   RBC 4.31 01/25/2022 1414   RBC 4.36 10/30/2015 2131   HGB 10.4 (L) 01/25/2022 1414   HCT 32.7 (L) 01/25/2022 1414   PLT 356 01/25/2022 1414   MCV 76 (L) 01/25/2022 1414   MCH 24.1 (L) 01/25/2022 1414   MCH 28.4 10/30/2015 2131   MCHC 31.8 01/25/2022 1414   MCHC 34.1 10/30/2015 2131   RDW 15.9 (H) 01/25/2022 1414   LYMPHSABS 2.0 12/16/2014 1249   MONOABS 0.5 12/16/2014 1249   EOSABS 0.3 12/16/2014 1249   BASOSABS 0.1 12/16/2014 1249    Lab Results  Component Value Date   HGBA1C 5.5 12/16/2014    Assessment & Plan:  1. Essential hypertension Uncontrolled Increase amlodipine from 5 mg to 10 mg Counseled on blood pressure goal of less than 130/80, low-sodium, DASH diet, medication compliance, 150 minutes of moderate intensity exercise per week. Discussed medication compliance, adverse effects. - amLODipine (NORVASC) 10 MG tablet; Take 1 tablet (10 mg total) by mouth daily.  Dispense: 90 tablet; Refill: 1 - CMP14+EGFR - lisinopril-hydrochlorothiazide (ZESTORETIC)  20-25 MG tablet; Take 1 tablet by mouth daily.  Dispense: 90 tablet; Refill: 1  2. PAD (peripheral artery disease) (HCC) Stable Smoking cessation strongly encouraged Continue aspirin and statin - atorvastatin (LIPITOR) 20 MG tablet; Take 1 tablet (20 mg total) by mouth daily.  Dispense: 90 tablet; Refill: 1  3. Tobacco dependence Spent 3 minutes counseling on smoking cessation and detrimental effects of ongoing tobacco use but he is not ready to quit    Meds ordered this encounter  Medications   amLODipine (NORVASC) 10 MG tablet    Sig: Take 1 tablet (10 mg total) by mouth daily.    Dispense:  90 tablet    Refill:  1    Dose increase   atorvastatin (LIPITOR) 20 MG tablet    Sig: Take 1 tablet (20 mg total) by mouth daily.    Dispense:  90 tablet    Refill:  1   lisinopril-hydrochlorothiazide (ZESTORETIC) 20-25 MG tablet    Sig: Take 1 tablet by mouth daily.    Dispense:  90 tablet    Refill:  1    Follow-up: Return in about 1 month (around 03/15/2023) for Blood Pressure follow-up, ok to double book.  Charlott Rakes, MD, FAAFP. [facility] and [place], [place]   02/14/2023, 3:39 PM

## 2023-02-14 NOTE — Patient Instructions (Signed)

## 2023-02-15 LAB — CMP14+EGFR
ALT: 10 IU/L (ref 0–44)
AST: 23 IU/L (ref 0–40)
Albumin/Globulin Ratio: 1.4 (ref 1.2–2.2)
Albumin: 4.2 g/dL (ref 3.9–4.9)
Alkaline Phosphatase: 104 IU/L (ref 44–121)
BUN/Creatinine Ratio: 9 — ABNORMAL LOW (ref 10–24)
BUN: 10 mg/dL (ref 8–27)
Bilirubin Total: 0.2 mg/dL (ref 0.0–1.2)
CO2: 24 mmol/L (ref 20–29)
Calcium: 9.3 mg/dL (ref 8.6–10.2)
Chloride: 102 mmol/L (ref 96–106)
Creatinine, Ser: 1.15 mg/dL (ref 0.76–1.27)
Globulin, Total: 2.9 g/dL (ref 1.5–4.5)
Glucose: 80 mg/dL (ref 70–99)
Potassium: 3.6 mmol/L (ref 3.5–5.2)
Sodium: 141 mmol/L (ref 134–144)
Total Protein: 7.1 g/dL (ref 6.0–8.5)
eGFR: 69 mL/min/{1.73_m2} (ref >59)

## 2023-03-15 ENCOUNTER — Ambulatory Visit: Payer: Medicare (Managed Care) | Attending: Family Medicine | Admitting: Family Medicine

## 2023-03-15 ENCOUNTER — Encounter: Payer: Self-pay | Admitting: Family Medicine

## 2023-03-15 VITALS — BP 134/61 | HR 60 | Temp 98.5°F | Ht 68.0 in | Wt 126.8 lb

## 2023-03-15 DIAGNOSIS — R430 Anosmia: Secondary | ICD-10-CM

## 2023-03-15 DIAGNOSIS — I10 Essential (primary) hypertension: Secondary | ICD-10-CM | POA: Diagnosis not present

## 2023-03-15 NOTE — Progress Notes (Signed)
Subjective:  Patient ID: Martin Mason, male    DOB: [birth year]  Age: 67 y.o. MRN: [id number]  CC: Hypertension   HPI Martin Mason is a 67 y.o. year old male with a history of hypertension, PAD, tobacco abuse here for a follow-up visit.    Interval History:  He presents for a blood pressure follow up as his blood pressure was elevated at his last office visit but is normal today.  He endorses adherence with his antihypertensive. Complains of inability to smell for the last 6 months and denies any gradual loss of smell but this occurred suddenly. He denies presence of headaches or visual loss.  He has no postnasal drip, no sinus congestion, no rhinorrhea. Past Medical History:  Diagnosis Date   Asthma    childhood- young child to age 67   Hypertension     Past Surgical History:  Procedure Laterality Date   COLONOSCOPY     CYST EXCISION     throat   TOOTH EXTRACTION N/A 11/06/2020   Procedure: DENTAL RESTORATION/EXTRACTIONS;  Surgeon: Diona Browner, DDS;  Location: [facility];  Service: Oral Surgery;  Laterality: N/A;    Family History  Problem Relation Age of Onset   Hypertension Mother    Cancer Mother    Hypertension Father    Diabetes Sister     Social History   Socioeconomic History   Marital status: Divorced    Spouse name: Not on file   Number of children: Not on file   Years of education: Not on file   Highest education level: Not on file  Occupational History   Not on file  Tobacco Use   Smoking status: Every Day    Packs/day: .12    Types: Cigarettes   Smokeless tobacco: Never   Tobacco comments:    3 daily  Vaping Use   Vaping Use: Never used  Substance and Sexual Activity   Alcohol use: No    Comment: quit drinking 7 years ago after 30 years of drinking   Drug use: Yes    Types: Marijuana    Comment: last time early November 2021   Sexual activity: Not on file  Other Topics Concern   Not on file  Social History Narrative   Not on file   Social  Determinants of Health   Financial Resource Strain: Low Risk  (02/06/2023)   Overall Financial Resource Strain (CARDIA)    Difficulty of Paying Living Expenses: Not hard at all  Food Insecurity: No Food Insecurity (02/06/2023)   Hunger Vital Sign    Worried About Running Out of Food in the Last Year: Never true    [place] in the Last Year: Never true  Transportation Needs: No Transportation Needs (02/06/2023)   PRAPARE - Hydrologist (Medical): No    Lack of Transportation (Non-Medical): No  Physical Activity: Sufficiently Active (02/06/2023)   Exercise Vital Sign    Days of Exercise per Week: 7 days    Minutes of Exercise per Session: 30 min  Stress: No Stress Concern Present (02/06/2023)   [place]    Feeling of Stress : Not at all  Social Connections: Moderately Isolated (02/06/2023)   Social Connection and Isolation Panel [NHANES]    Frequency of Communication with Friends and Family: Three times a week    Frequency of Social Gatherings with Friends and Family: Three times a week    Attends  Religious Services: 1 to 4 times per year    Active Member of Clubs or Organizations: No    Attends Archivist Meetings: Never    Marital Status: Divorced    No Known Allergies  Outpatient Medications Prior to Visit  Medication Sig Dispense Refill   amLODipine (NORVASC) 10 MG tablet Take 1 tablet (10 mg total) by mouth daily. 90 tablet 1   atorvastatin (LIPITOR) 20 MG tablet Take 1 tablet (20 mg total) by mouth daily. 90 tablet 1   EQ ALLERGY RELIEF, CETIRIZINE, 10 MG tablet Take 1 tablet by mouth once daily 90 tablet 1   ferrous sulfate 325 (65 FE) MG tablet Take 1 tablet (325 mg total) by mouth daily with breakfast. 90 tablet 0   fluticasone (FLONASE) 50 MCG/ACT nasal spray Use 2 spray(s) in each nostril once daily 16 g 2   lisinopril-hydrochlorothiazide (ZESTORETIC) 20-25 MG  tablet Take 1 tablet by mouth daily. 90 tablet 1   sildenafil (VIAGRA) 50 MG tablet Take 1 tablet (50 mg total) by mouth daily as needed for erectile dysfunction. At least 24 hours between doses 10 tablet 1   No facility-administered medications prior to visit.     ROS Review of Systems  Constitutional:  Negative for activity change and appetite change.  HENT:  Negative for sinus pressure and sore throat.   Respiratory:  Negative for chest tightness, shortness of breath and wheezing.   Cardiovascular:  Negative for chest pain and palpitations.  Gastrointestinal:  Negative for abdominal distention, abdominal pain and constipation.  Genitourinary: Negative.   Musculoskeletal: Negative.   Psychiatric/Behavioral:  Negative for behavioral problems and dysphoric mood.     Objective:  BP 134/61   Pulse 60   Temp 98.5 F (36.9 C) (Oral)   Ht 5\' 8"  (1.727 m)   Wt 126 lb 12.8 oz (57.5 kg)   SpO2 100%   BMI 19.28 kg/m      03/15/2023    1:41 PM 02/14/2023    2:01 PM 11/22/2022    3:57 PM  BP/Weight  Systolic BP [id number] 0000000 [id number]  Diastolic BP 61 76 86  Wt. (Lbs) 126.8 128.6 121  BMI 19.28 kg/m2 19.55 kg/m2 18.13 kg/m2      Physical Exam Constitutional:      Appearance: He is well-developed.  HENT:     Nose: Nose normal.     Mouth/Throat:     Mouth: Mucous membranes are moist.  Cardiovascular:     Rate and Rhythm: Normal rate.     Heart sounds: Normal heart sounds. No murmur heard. Pulmonary:     Effort: Pulmonary effort is normal.     Breath sounds: Normal breath sounds. No wheezing or rales.  Chest:     Chest wall: No tenderness.  Abdominal:     General: Bowel sounds are normal. There is no distension.     Palpations: Abdomen is soft. There is no mass.     Tenderness: There is no abdominal tenderness.  Musculoskeletal:        General: Normal range of motion.     Right lower leg: No edema.     Left lower leg: No edema.  Neurological:     Mental Status: He is alert and  oriented to person, place, and time.  Psychiatric:        Mood and Affect: Mood normal.        Latest Ref Rng & Units 02/14/2023    2:34 PM 01/25/2022  2:14 PM 04/28/2021    4:38 PM  CMP  Glucose 70 - 99 mg/dL 80  75  84   BUN 8 - 27 mg/dL 10  14  13    Creatinine 0.76 - 1.27 mg/dL 1.15  1.29  1.26   Sodium 134 - 144 mmol/L 141  144  145   Potassium 3.5 - 5.2 mmol/L 3.6  3.8  3.7   Chloride 96 - 106 mmol/L 102  101  106   CO2 20 - 29 mmol/L 24  23  24    Calcium 8.6 - 10.2 mg/dL 9.3  9.6  9.8   Total Protein 6.0 - 8.5 g/dL 7.1  7.3    Total Bilirubin 0.0 - 1.2 mg/dL 0.2  0.3    Alkaline Phos 44 - 121 IU/L 104  79    AST 0 - 40 IU/L 23  24    ALT 0 - 44 IU/L 10  14      Lipid Panel     Component Value Date/Time   CHOL 156 07/03/2019 1105   TRIG 67 07/03/2019 1105   HDL 54 07/03/2019 1105   CHOLHDL 2.9 07/03/2019 1105   CHOLHDL 3.4 12/16/2014 1249   VLDL 13 12/16/2014 1249   LDLCALC 89 07/03/2019 1105    CBC    Component Value Date/Time   WBC 7.2 01/25/2022 1414   WBC 5.9 10/30/2015 2131   RBC 4.31 01/25/2022 1414   RBC 4.36 10/30/2015 2131   HGB 10.4 (L) 01/25/2022 1414   HCT 32.7 (L) 01/25/2022 1414   PLT 356 01/25/2022 1414   MCV 76 (L) 01/25/2022 1414   MCH 24.1 (L) 01/25/2022 1414   MCH 28.4 10/30/2015 2131   MCHC 31.8 01/25/2022 1414   MCHC 34.1 10/30/2015 2131   RDW 15.9 (H) 01/25/2022 1414   LYMPHSABS 2.0 12/16/2014 1249   MONOABS 0.5 12/16/2014 1249   EOSABS 0.3 12/16/2014 1249   BASOSABS 0.1 12/16/2014 1249    Lab Results  Component Value Date   HGBA1C 5.5 12/16/2014    Assessment & Plan:  1. Essential hypertension Controlled Continue amlodipine  2. Anosmia He denies presence of sinus symptoms No foreign body observed in nostril Will refer to ENT for endoscopic procedure - Ambulatory referral to ENT    No orders of the defined types were placed in this encounter.   Follow-up: Return in about 6 months (around 09/15/2023) for Chronic  medical conditions.       Charlott Rakes, MD, FAAFP. [facility] and [place], [place]   03/15/2023, 2:01 PM

## 2023-03-15 NOTE — Patient Instructions (Signed)
Managing Your Hypertension Hypertension, also called high blood pressure, is when the force of the blood pressing against the walls of the arteries is too strong. Arteries are blood vessels that carry blood from your heart throughout your body. Hypertension forces the heart to work harder to pump blood and may cause the arteries to become narrow or stiff. Understanding blood pressure readings A blood pressure reading includes a higher number over a lower number: The first, or top, number is called the systolic pressure. It is a measure of the pressure in your arteries as your heart beats. The second, or bottom number, is called the diastolic pressure. It is a measure of the pressure in your arteries as the heart relaxes. For most people, a normal blood pressure is below 120/80. Your personal target blood pressure may vary depending on your medical conditions, your age, and other factors. Blood pressure is classified into four stages. Based on your blood pressure reading, your health care provider may use the following stages to determine what type of treatment you need, if any. Systolic pressure and diastolic pressure are measured in a unit called millimeters of mercury (mmHg). Normal Systolic pressure: below 120. Diastolic pressure: below 80. Elevated Systolic pressure: 120-129. Diastolic pressure: below 80. Hypertension stage 1 Systolic pressure: 130-139. Diastolic pressure: 80-89. Hypertension stage 2 Systolic pressure: 140 or above. Diastolic pressure: 90 or above. How can this condition affect me? Managing your hypertension is very important. Over time, hypertension can damage the arteries and decrease blood flow to parts of the body, including the brain, heart, and kidneys. Having untreated or uncontrolled hypertension can lead to: A heart attack. A stroke. A weakened blood vessel (aneurysm). Heart failure. Kidney damage. Eye damage. Memory and concentration problems. Vascular  dementia. What actions can I take to manage this condition? Hypertension can be managed by making lifestyle changes and possibly by taking medicines. Your health care provider will help you make a plan to bring your blood pressure within a normal range. You may be referred for counseling on a healthy diet and physical activity. Nutrition  Eat a diet that is high in fiber and potassium, and low in salt (sodium), added sugar, and fat. An example eating plan is called the DASH diet. DASH stands for Dietary Approaches to Stop Hypertension. To eat this way: Eat plenty of fresh fruits and vegetables. Try to fill one-half of your plate at each meal with fruits and vegetables. Eat whole grains, such as whole-wheat pasta, brown rice, or whole-grain bread. Fill about one-fourth of your plate with whole grains. Eat low-fat dairy products. Avoid fatty cuts of meat, processed or cured meats, and poultry with skin. Fill about one-fourth of your plate with lean proteins such as fish, chicken without skin, beans, eggs, and tofu. Avoid pre-made and processed foods. These tend to be higher in sodium, added sugar, and fat. Reduce your daily sodium intake. Many people with hypertension should eat less than 1,500 mg of sodium a day. Lifestyle  Work with your health care provider to maintain a healthy body weight or to lose weight. Ask what an ideal weight is for you. Get at least 30 minutes of exercise that causes your heart to beat faster (aerobic exercise) most days of the week. Activities may include walking, swimming, or biking. Include exercise to strengthen your muscles (resistance exercise), such as weight lifting, as part of your weekly exercise routine. Try to do these types of exercises for 30 minutes at least 3 days a week. Do   not use any products that contain nicotine or tobacco. These products include cigarettes, chewing tobacco, and vaping devices, such as e-cigarettes. If you need help quitting, ask your  health care provider. Control any long-term (chronic) conditions you have, such as high cholesterol or diabetes. Identify your sources of stress and find ways to manage stress. This may include meditation, deep breathing, or making time for fun activities. Alcohol use Do not drink alcohol if: Your health care provider tells you not to drink. You are pregnant, may be pregnant, or are planning to become pregnant. If you drink alcohol: Limit how much you have to: 0-1 drink a day for women. 0-2 drinks a day for men. Know how much alcohol is in your drink. In the [place] one drink equals one 12 oz bottle of beer (355 mL), one 5 oz glass of wine (148 mL), or one 1 oz glass of hard liquor (44 mL). Medicines Your health care provider may prescribe medicine if lifestyle changes are not enough to get your blood pressure under control and if: Your systolic blood pressure is 130 or higher. Your diastolic blood pressure is 80 or higher. Take medicines only as told by your health care provider. Follow the directions carefully. Blood pressure medicines must be taken as told by your health care provider. The medicine does not work as well when you skip doses. Skipping doses also puts you at risk for problems. Monitoring Before you monitor your blood pressure: Do not smoke, drink caffeinated beverages, or exercise within 30 minutes before taking a measurement. Use the bathroom and empty your bladder (urinate). Sit quietly for at least 5 minutes before taking measurements. Monitor your blood pressure at home as told by your health care provider. To do this: Sit with your back straight and supported. Place your feet flat on the floor. Do not cross your legs. Support your arm on a flat surface, such as a table. Make sure your upper arm is at heart level. Each time you measure, take two or three readings one minute apart and record the results. You may also need to have your blood pressure checked regularly by  your health care provider. General information Talk with your health care provider about your diet, exercise habits, and other lifestyle factors that may be contributing to hypertension. Review all the medicines you take with your health care provider because there may be side effects or interactions. Keep all follow-up visits. Your health care provider can help you create and adjust your plan for managing your high blood pressure. Where to find more information National Heart, Lung, and Blood Institute: www.nhlbi.nih.gov American Heart Association: www.heart.org Contact a health care provider if: You think you are having a reaction to medicines you have taken. You have repeated (recurrent) headaches. You feel dizzy. You have swelling in your ankles. You have trouble with your vision. Get help right away if: You develop a severe headache or confusion. You have unusual weakness or numbness, or you feel faint. You have severe pain in your chest or abdomen. You vomit repeatedly. You have trouble breathing. These symptoms may be an emergency. Get help right away. Call 911. Do not wait to see if the symptoms will go away. Do not drive yourself to the hospital. Summary Hypertension is when the force of blood pumping through your arteries is too strong. If this condition is not controlled, it may put you at risk for serious complications. Your personal target blood pressure may vary depending on your medical conditions,   your age, and other factors. For most people, a normal blood pressure is less than 120/80. Hypertension is managed by lifestyle changes, medicines, or both. Lifestyle changes to help manage hypertension include losing weight, eating a healthy, low-sodium diet, exercising more, stopping smoking, and limiting alcohol. This information is not intended to replace advice given to you by your health care provider. Make sure you discuss any questions you have with your health care  provider. Document Revised: 08/26/2021 Document Reviewed: 08/26/2021 Elsevier Patient Education  2023 Elsevier Inc.  

## 2023-03-15 NOTE — Progress Notes (Signed)
Not able to smell good.

## 2023-04-07 ENCOUNTER — Other Ambulatory Visit: Payer: Self-pay | Admitting: Family Medicine

## 2023-04-07 DIAGNOSIS — R430 Anosmia: Secondary | ICD-10-CM

## 2023-04-12 ENCOUNTER — Ambulatory Visit: Payer: Medicare (Managed Care) | Attending: Family Medicine | Admitting: Pharmacist

## 2023-04-12 DIAGNOSIS — Z79899 Other long term (current) drug therapy: Secondary | ICD-10-CM

## 2023-04-12 NOTE — Progress Notes (Signed)
04/12/2023 Name: Martin Mason MRN: [id number] DOB: Dec 25, 1956  No chief complaint on file.   Martin Mason is a 67 y.o. year old male who presented for a telephone visit.   They were referred to the pharmacist by a quality report for assistance in managing hypertension.   Patient is participating in a Managed Medicaid Plan:  no  Subjective:  Care Team: Primary Care Provider: Hoy Register, MD ; Next Scheduled Visit: 09/18/2023  Medication Access/Adherence  Current Pharmacy:  [id number] - [place] - [address] [place] ([place]) [place] [address] Phone: [phone] Fax: [phone]   Patient reports affordability concerns with their medications: No  Patient reports access/transportation concerns to their pharmacy: No  Patient reports adherence concerns with their medications:  No     Hypertension:  Current medications: amlodipine 10 mg daily, lisinopril-HCTZ 20-25 mg daily   Patient does not have a validated, automated, upper arm home BP cuff Current blood pressure readings readings: none  Patient denies hypotensive s/sx including dizziness, lightheadedness.  Patient denies hypertensive symptoms including headache, chest pain, shortness of breath  Current meal patterns: compliant with salt restriction. Denies excessive intake of caffeine.   Current physical activity: walks ~2-3 hours a day.    Objective:  Lab Results  Component Value Date   HGBA1C 5.5 12/16/2014    Lab Results  Component Value Date   CREATININE 1.15 02/14/2023   BUN 10 02/14/2023   NA 141 02/14/2023   K 3.6 02/14/2023   CL 102 02/14/2023   CO2 24 02/14/2023    Lab Results  Component Value Date   CHOL 156 07/03/2019   HDL 54 07/03/2019   LDLCALC 89 07/03/2019   TRIG 67 07/03/2019   CHOLHDL 2.9 07/03/2019    Medications Reviewed Today     Reviewed by Ronette Deter, CMA (Certified Medical Assistant) on 03/15/23 at 1342   Med List Status: <None>   Medication Order Taking? Sig Documenting Provider Last Dose Status Informant  amLODipine (NORVASC) 10 MG tablet [id number] Yes Take 1 tablet (10 mg total) by mouth daily. Hoy Register, MD Taking Active   atorvastatin (LIPITOR) 20 MG tablet [id number] Yes Take 1 tablet (20 mg total) by mouth daily. Hoy Register, MD Taking Active   EQ ALLERGY RELIEF, CETIRIZINE, 10 MG tablet [id number] Yes Take 1 tablet by mouth once daily Hoy Register, MD Taking Active   ferrous sulfate 325 (65 FE) MG tablet [id number] Yes Take 1 tablet (325 mg total) by mouth daily with breakfast. Hoy Register, MD Taking Active   fluticasone (FLONASE) 50 MCG/ACT nasal spray [id number] Yes Use 2 spray(s) in each nostril once daily Hoy Register, MD Taking Active   lisinopril-hydrochlorothiazide (ZESTORETIC) 20-25 MG tablet [id number] Yes Take 1 tablet by mouth daily. Hoy Register, MD Taking Active   sildenafil (VIAGRA) 50 MG tablet [id number] Yes Take 1 tablet (50 mg total) by mouth daily as needed for erectile dysfunction. At least 24 hours between doses Hoy Register, MD Taking Active               Assessment/Plan:   Hypertension: - Currently controlled. BP 134/61 mmHg at last clinic visit. BP goal is <130/80 mmHg. Dr. Alvis Lemmings increased his amlodipine to 10 mg daily back in Feb of this year. He is compliant with this and his combination lisinopril-HCTZ.  - Reviewed long term cardiovascular and renal outcomes of uncontrolled blood pressure - Reviewed appropriate blood pressure monitoring technique and reviewed goal blood pressure.  Recommended to check home blood pressure and heart rate.  - Recommend to call and make an appt with me before his next PCP appt if needed.      Follow Up Plan: with PCP in September.   [name], PharmD, [place]y [place], CPP Clinical Pharmacist [facility] [phone]

## 2023-05-05 ENCOUNTER — Other Ambulatory Visit: Payer: Self-pay | Admitting: Family Medicine

## 2023-05-05 DIAGNOSIS — R430 Anosmia: Secondary | ICD-10-CM

## 2023-05-05 NOTE — Telephone Encounter (Signed)
Requested Prescriptions  Pending Prescriptions Disp Refills   fluticasone (FLONASE) 50 MCG/ACT nasal spray [Pharmacy Med Name: Fluticasone Propionate 50 MCG/ACT Nasal Suspension] 16 g 0    Sig: Use 2 spray(s) in each nostril once daily     Ear, Nose, and Throat: Nasal Preparations - Corticosteroids Passed - 05/05/2023 10:31 AM      Passed - Valid encounter within last 12 months    Recent Outpatient Visits           3 weeks ago Medication management   [facility] [place], Cornelius Moras, RPH-CPP   1 month ago Essential hypertension   [facility] Hoy Register, MD   2 months ago Tobacco dependence   [place] [facility] Hoy Register, MD   5 months ago Essential hypertension   [facility] Hoy Register, MD   1 year ago Anosmia   [place] [facility] Hoy Register, MD       Future Appointments             In 4 months Hoy Register, MD [facility]

## 2023-06-08 ENCOUNTER — Other Ambulatory Visit: Payer: Self-pay | Admitting: Family Medicine

## 2023-06-08 DIAGNOSIS — R430 Anosmia: Secondary | ICD-10-CM

## 2023-06-19 ENCOUNTER — Ambulatory Visit: Payer: Self-pay

## 2023-06-19 NOTE — Telephone Encounter (Signed)
  Chief Complaint: Swollen ankles Symptoms: above Frequency: 3 days Pertinent Negatives: Patient denies pain, sob, cough Disposition: [] ED /[] Urgent Care (no appt availability in office) / [] Appointment(In office/virtual)/ []  [facility]/ [] Home Care/ [] Refused Recommended Disposition /[x] [place]/ []  Follow-up with PCP Additional Notes: Pt states that his ankles have been swollen for the past 3 days. No pain, Pt will go to Mobile unit tomorrow. No appts in office.   Summary: ankles swollen   Pt called in says, both ankles are swollen, fills like something pullling in the back of them when he walks.     Reason for Disposition  MILD or MODERATE ankle swelling (e.g., can't move joint normally, can't do usual activities) (Exceptions: Itchy, localized swelling; swelling is chronic.)  Answer Assessment - Initial Assessment Questions 1. LOCATION: "Which ankle is swollen?" "Where is the swelling?"     both 2. ONSET: "When did the swelling start?"     3 days 3. SWELLING: "How bad is the swelling?" Or, "How large is it?" (e.g., mild, moderate, severe; size of localized swelling)    - NONE: No joint swelling.   - LOCALIZED: Localized; small area of puffy or swollen skin (e.g., insect bite, skin irritation).   - MILD: Joint looks or feels mildly swollen or puffy.   - MODERATE: Swollen; interferes with normal activities (e.g., work or school); decreased range of movement; may be limping.   - SEVERE: Very swollen; can't move swollen joint at all; limping a lot or unable to walk.     mild 4. PAIN: "Is there any pain?" If Yes, ask: "How bad is it?" (Scale 1-10; or mild, moderate, severe)   - NONE (0): no pain.   - MILD (1-3): doesn't interfere with normal activities.    - MODERATE (4-7): interferes with normal activities (e.g., work or school) or awakens from sleep, limping.    - SEVERE (8-10): excruciating pain, unable to do any normal activities, unable to walk.       none 6. OTHER SYMPTOMS: "Do you have any other symptoms?" (e.g., fever, chest pain, difficulty breathing, calf pain)     no  Protocols used: Ankle Swelling-A-AH

## 2023-06-19 NOTE — Telephone Encounter (Signed)
Summary: ankles swollen   Pt called in says, both ankles are swollen, fills like something pullling in the back of them when he walks.        Called pt - left message on machine to return our call. 

## 2023-06-19 NOTE — Telephone Encounter (Signed)
Summary: ankles swollen   Pt called in says, both ankles are swollen, fills like something pullling in the back of them when he walks.        Called pt - left message on machine to return our call.

## 2023-06-19 NOTE — Telephone Encounter (Signed)
Noted  

## 2023-06-20 ENCOUNTER — Ambulatory Visit: Payer: Medicare (Managed Care) | Admitting: Physician Assistant

## 2023-06-20 ENCOUNTER — Encounter: Payer: Self-pay | Admitting: Physician Assistant

## 2023-06-20 ENCOUNTER — Telehealth: Payer: Self-pay

## 2023-06-20 VITALS — BP 122/61 | HR 70 | Ht 68.5 in | Wt 122.0 lb

## 2023-06-20 DIAGNOSIS — I70213 Atherosclerosis of native arteries of extremities with intermittent claudication, bilateral legs: Secondary | ICD-10-CM

## 2023-06-20 DIAGNOSIS — R600 Localized edema: Secondary | ICD-10-CM

## 2023-06-20 DIAGNOSIS — I739 Peripheral vascular disease, unspecified: Secondary | ICD-10-CM

## 2023-06-20 NOTE — Patient Instructions (Addendum)
Will schedule you a follow-up with vein and vascular and call you with that appointment.  In the meantime, I do encourage you to continue working on smoking cessation, keeping your feet elevated when able, and consider wearing compression stockings when walking.  Please let us know if there is anything else we can do for you  Roney Jaffe, PA-C Physician Assistant [facility] [email or web address]    Peripheral Vascular Disease  Peripheral vascular disease (PVD) is a disease of the blood vessels. PVD may also be called peripheral artery disease (PAD) or poor circulation. PVD is the blocking or hardening of the arteries anywhere within the circulatory system beyond the heart. This can result in a decreased supply of blood to the arms, legs, and internal organs, such as the stomach or kidneys. However, PVD most often affects a person's lower legs and feet. Without treatment, PVD often worsens. PVD can lead to acute limb ischemia. This occurs when an arm or leg suddenly has trouble getting enough blood. This is a medical emergency. What are the causes? The most common cause of PVD is atherosclerosis. This is a buildup of fatty material and other substances (plaque)inside your arteries. Pieces of plaque can break off from the walls of an artery and become stuck in a smaller artery, blocking blood flow and possibly causing acute limb ischemia. Other common causes of PVD include: Blood clots that form inside the blood vessels. Injuries to blood vessels. Diseases that cause inflammation of blood vessels or cause blood vessel tightening (spasms). What increases the risk? The following factors may make you more likely to develop this condition: A family history of PVD. Common medical conditions, including: High cholesterol. Diabetes. High blood pressure (hypertension). Heart disease. Known atherosclerotic disease in another area of the  body. Past injury, such as burns or a broken bone. Other medical conditions, such as: Buerger's disease. This is caused by inflamed blood vessels in your hands and feet. Some forms of arthritis. Birth defects that affect the arteries in your legs. Kidney disease. Using tobacco and nicotine products. Not getting enough exercise. Obesity. Being age 77 or older, or being age 83 or older and having the other risk factors. What are the signs or symptoms? This condition may cause different symptoms. Your symptoms depend on what body part is not getting enough blood. Common signs and symptoms include: Cramps in your buttocks, legs, and feet. Intermittent claudication. This is pain and weakness in your legs during activity that resolves with rest. Leg pain at rest and leg numbness, tingling, or weakness. Coldness in a leg or foot, especially when compared to the other leg or foot. Skin or hair changes. These can include: Hair loss. Shiny skin. Pale or bluish skin. Thick toenails. Inability to get or maintain an erection (erectile dysfunction). Tiredness (fatigue). Weak pulse or no pulse in the feet. People with PVD are more likely to develop open wounds (ulcers) and sores on their toes, feet, or legs. The ulcers or sores may take longer than normal to heal. How is this diagnosed? PVD is diagnosed based on your signs and symptoms, a physical exam, and your medical history. You may also have other tests to find the cause. Tests include: Ankle-brachial index test.This test compares the blood pressure readings of the legs and arms. This may also include an exercise ankle-brachial index test in which you walk on a treadmill to check your symptoms. Doppler ultrasound. This takes pictures of blood flow through your blood vessels. Imaging studies  that use dye to show blood flow. These are: CT angiogram. Magnetic resonance angiogram, or MRA. How is this treated? Treatment for PVD depends on the  cause of your condition, how severe your symptoms are, and your age. Underlying causes need to be treated and controlled. These include long-term (chronic) conditions, such as diabetes, high cholesterol, and hypertension. Treatment may include: Lifestyle changes, such as: Quitting tobacco use. Exercising regularly. Following a low-fat, low-cholesterol diet. Not drinking alcohol. Taking medicines, such as: Blood thinners to prevent blood clots. Medicines to improve blood flow. Medicines to improve cholesterol levels. Procedures, such as: Angioplasty. This uses an inflated balloon to open a blocked artery and improve blood flow. Stent implant. This inserts a small mesh tube to keep a blocked artery open. Peripheral bypass surgery. This reroutes blood flow around a blocked artery. Surgery to remove dead tissue from an infected wound (debridement). Amputation. This is surgical removal of the affected limb. It may be necessary in cases of acute limb ischemia when medical or surgical treatments have not helped. Follow these instructions at home: Medicines Take over-the-counter and prescription medicines only as told by your health care provider. If you are taking blood thinners: Talk with your health care provider before you take any medicines that contain aspirin or NSAIDs, such as ibuprofen. These medicines increase your risk for dangerous bleeding. Take your medicine exactly as told, at the same time every day. Avoid activities that could cause injury or bruising, and follow instructions about how to prevent falls. Wear a medical alert bracelet or carry a card that lists what medicines you take. Lifestyle     Exercise regularly. Ask your health care provider about some good activities for you. Talk with your health care provider about maintaining a healthy weight. If needed, ask about losing weight. Eat a diet that is low in fat and cholesterol. If you need help, talk with your health care  provider. Do not drink alcohol. Do not use any products that contain nicotine or tobacco. These products include cigarettes, chewing tobacco, and vaping devices, such as e-cigarettes. If you need help quitting, ask your health care provider. General instructions Take good care of your feet. To do this: Wear comfortable shoes that fit well. Check your feet often for any cuts or sores. Get an annual influenza vaccine. Keep all follow-up visits. This is important. Where to find more information Society for Vascular Surgery: vascular.org American Heart Association: heart.org National Heart, Lung, and Blood Institute: [employer].gl Contact a health care provider if: You have leg cramps while walking. You have leg pain when you rest. Your leg or foot feels cold. Your skin changes color. You have erectile dysfunction. You have cuts or sores on your legs or feet that do not heal. Get help right away if: You have sudden changes in color and feeling of your arms or legs, such as: Your arm or leg turns cold, numb, and blue. Your arm or leg becomes red, warm, swollen, painful, or numb. You have any symptoms of a stroke. "BE FAST" is an easy way to remember the main warning signs of a stroke: B - Balance. Signs are dizziness, sudden trouble walking, or loss of balance. E - Eyes. Signs are trouble seeing or a sudden change in vision. F - Face. Signs are sudden weakness or numbness of the face, or the face or eyelid drooping on one side. A - Arms. Signs are weakness or numbness in an arm. This happens suddenly and usually on one side  of the body. S - Speech. Signs are sudden trouble speaking, slurred speech, or trouble understanding what people say. T - Time. Time to call emergency services. Write down what time symptoms started. You have other signs of a stroke, such as: A sudden, severe headache with no known cause. Nausea or vomiting. Seizure. You have chest pain or trouble breathing. These  symptoms may represent a serious problem that is an emergency. Do not wait to see if the symptoms will go away. Get medical help right away. Call your local emergency services (911 in the [place] Do not drive yourself to the hospital. Summary Peripheral vascular disease (PVD) is a disease of the blood vessels. PVD is the blocking or hardening of the arteries anywhere within the circulatory system beyond the heart. PVD may cause different symptoms. Your symptoms depend on what part of your body is not getting enough blood. Treatment for PVD depends on what caused it, how severe your symptoms are, and your age. This information is not intended to replace advice given to you by your health care provider. Make sure you discuss any questions you have with your health care provider. Document Revised: 06/15/2020 Document Reviewed: 06/15/2020 Elsevier Patient Education  2024 [employer].

## 2023-06-20 NOTE — Progress Notes (Signed)
Established Patient Office Visit  Subjective   Patient ID: Martin Mason, male    DOB: 12-13-1956  Age: 67 y.o. MRN: [id number]  Chief Complaint  Patient presents with   Leg Pain    Bilateral leg pain, noticed a few days ago when walking, pain score of 2. Described as pulling feeling.    Joint Swelling    Bilateral ankle swelling    States that he has been having increased bilateral lower leg pain over the past 2 weeks.  States that he walks approximately 1/2 mile to a store and was started noticing the pain on his half mile walk back home.  States that previously he had the pain in his his right lower leg.  States that he also has had some swelling in both ankles over the past 2 weeks.  Describes the pain as something pulling on the back of his calves.  States that the swelling in his ankles is improved in the morning after waking but does increase as the day goes on.  States that he is down to smoking 2 cigarettes a day.  Denies chest pain or shortness of breath.  Of significant history, he had office visit with his primary care provider in November 2023, plan from that visit:  3. PAD (peripheral artery disease) (HCC) He has intermittent claudication In office ABI reveals PAD and right leg, left leg 0.6 Risk factor modification including smoking cessation.  He is not ready to quit smoking. We will initiate statin - POCT ABI Screening for Pilot No Charge - atorvastatin (LIPITOR) 20 MG tablet; Take 1 tablet (20 mg total) by mouth daily.  Dispense: 30 tablet; Refill: 3 - Ambulatory referral to Vascular Surgery  He then was seen by vascular surgery on November 22, 2022.  Plan from that visit: DATA:    ABI on 11/11/22 on the left was 0.69 and on the right was PAD per the report   Assessment/Plan:   67 year old male presents for evaluation of PAD after screening ABIs.  Per the report his ABI on the right was PAD and his ABI on the left was 0.69.  He endorses bilateral calf  claudication but only after walking 1 mile to the store and then he can walk 1 mile back home.  He also states he can run.  Discussed that for vascular claudication we manage this with lifestyle modification until it becomes lifestyle limiting.  I have recommended he start a 81 mg aspirin in addition to his statin therapy.  Discussed smoking cessation.  Discussed walking therapies which he is already doing.  I did offer to prescribe Pletal but he does not want any additional medications at this time.  I will follow-up with him in 1 year with repeat ABIs.  I do not believe his claudication is disabling at this time.  No signs of critical limb ischemia like tissue loss or rest pain.   [name], MD [facility] Office: [phone]       Past Medical History:  Diagnosis Date   Asthma    childhood- young child to age 25   Hypertension    Social History   Socioeconomic History   Marital status: Divorced    Spouse name: Not on file   Number of children: Not on file   Years of education: Not on file   Highest education level: Not on file  Occupational History   Not on file  Tobacco Use   Smoking status:  Every Day    Packs/day: .12    Types: Cigarettes   Smokeless tobacco: Never   Tobacco comments:    3 daily  Vaping Use   Vaping Use: Never used  Substance and Sexual Activity   Alcohol use: No    Comment: quit drinking 7 years ago after 30 years of drinking   Drug use: Yes    Types: Marijuana    Comment: last time early November 2021   Sexual activity: Not on file  Other Topics Concern   Not on file  Social History Narrative   Not on file   Social Determinants of Health   Financial Resource Strain: Low Risk  (02/06/2023)   Overall Financial Resource Strain (CARDIA)    Difficulty of Paying Living Expenses: Not hard at all  Food Insecurity: No Food Insecurity (02/06/2023)   Hunger Vital Sign    Worried About Running Out of Food in  the Last Year: Never true    Ran Out of Food in the Last Year: Never true  Transportation Needs: No Transportation Needs (02/06/2023)   PRAPARE - Administrator, Civil Service (Medical): No    Lack of Transportation (Non-Medical): No  Physical Activity: Sufficiently Active (02/06/2023)   Exercise Vital Sign    Days of Exercise per Week: 7 days    Minutes of Exercise per Session: 30 min  Stress: No Stress Concern Present (02/06/2023)   [employer] of Occupational Health - Occupational Stress Questionnaire    Feeling of Stress : Not at all  Social Connections: Moderately Isolated (02/06/2023)   Social Connection and Isolation Panel [NHANES]    Frequency of Communication with Friends and Family: Three times a week    Frequency of Social Gatherings with Friends and Family: Three times a week    Attends Religious Services: 1 to 4 times per year    Active Member of Clubs or Organizations: No    Attends Banker Meetings: Never    Marital Status: Divorced  Catering manager Violence: Not At Risk (02/06/2023)   Humiliation, Afraid, Rape, and Kick questionnaire    Fear of Current or Ex-Partner: No    Emotionally Abused: No    Physically Abused: No    Sexually Abused: No   Family History  Problem Relation Age of Onset   Hypertension Mother    Cancer Mother    Hypertension Father    Diabetes Sister    No Known Allergies  Review of Systems  Constitutional: Negative.   HENT: Negative.    Eyes: Negative.   Respiratory:  Negative for shortness of breath.   Cardiovascular:  Negative for chest pain.  Gastrointestinal: Negative.   Genitourinary: Negative.   Musculoskeletal: Negative.   Skin: Negative.   Neurological: Negative.   Endo/Heme/Allergies: Negative.   Psychiatric/Behavioral: Negative.        Objective:     BP 122/61 (BP Location: Left Arm, Patient Position: Sitting, Cuff Size: Large)   Pulse 70   Ht 5' 8.5" (1.74 m)   Wt 122 lb (55.3 kg)    SpO2 99%   BMI 18.28 kg/m  BP Readings from Last 3 Encounters:  06/20/23 122/61  03/15/23 134/61  02/14/23 (!) 158/76   Wt Readings from Last 3 Encounters:  06/20/23 122 lb (55.3 kg)  03/15/23 126 lb 12.8 oz (57.5 kg)  02/14/23 128 lb 9.6 oz (58.3 kg)      Physical Exam Vitals and nursing note reviewed.  Constitutional:  Appearance: Normal appearance.  HENT:     Head: Normocephalic and atraumatic.     Right Ear: External ear normal.     Left Ear: External ear normal.     Nose: Nose normal.     Mouth/Throat:     Mouth: Mucous membranes are moist.     Pharynx: Oropharynx is clear.  Eyes:     Extraocular Movements: Extraocular movements intact.     Conjunctiva/sclera: Conjunctivae normal.     Pupils: Pupils are equal, round, and reactive to light.  Cardiovascular:     Rate and Rhythm: Normal rate and regular rhythm.     Pulses:          Dorsalis pedis pulses are 0 on the right side and 0 on the left side.       Posterior tibial pulses are 0 on the right side and 0 on the left side.     Heart sounds: Normal heart sounds.  Pulmonary:     Effort: Pulmonary effort is normal.     Breath sounds: Normal breath sounds.  Musculoskeletal:        General: Normal range of motion.     Cervical back: Normal range of motion and neck supple.     Right lower leg: 1+ Edema present.     Left lower leg: 1+ Edema present.  Skin:    General: Skin is warm and dry.  Neurological:     General: No focal deficit present.     Mental Status: He is alert and oriented to person, place, and time.  Psychiatric:        Mood and Affect: Mood normal.        Behavior: Behavior normal.        Thought Content: Thought content normal.        Judgment: Judgment normal.        Assessment & Plan:   Problem List Items Addressed This Visit   None Visit Diagnoses     PAD (peripheral artery disease) (HCC)    -  Primary   Localized edema          1. PAD (peripheral artery disease) (HCC) CMA  called vascular and vein specialist in [facility] to help patient obtain appointment for further evaluation.  Message was left.  Patient education given on supportive care.  Encourage patient to make sure he completes follow-up with vascular and vein specialist.  Red flags given for prompt reevaluation.  2. Localized edema Patient education given on supportive care, red flags given for prompt reevaluation.   I have reviewed the patient's medical history (PMH, PSH, Social History, Family History, Medications, and allergies) , and have been updated if relevant. I spent 30 minutes reviewing chart and  face to face time with patient.    Return if symptoms worsen or fail to improve.    Kasandra Knudsen Mayers, PA-C

## 2023-06-20 NOTE — Telephone Encounter (Signed)
Caller: [facility]  Reviewed pt's chart, called pt for clarification, two identifiers used  Concern: claudication, swelling at ankles  Pt walks 1/2 mile to store and then struggles returning d/t the tightness in his calves.  Pt denies rest pain, redness, open wounds/sores  Location: left leg, right leg  Description:  x 1 month  Aggravating Factors: walking  Quality: tight (pulling)  Treatments:  elevate legs for swelling  Resolution: Appointment scheduled for first available with 7 days notice for transportation  Next Appt: Appointment scheduled for 06/28/23 for ABI & PA

## 2023-06-27 ENCOUNTER — Ambulatory Visit (HOSPITAL_COMMUNITY)
Admission: RE | Admit: 2023-06-27 | Discharge: 2023-06-27 | Disposition: A | Discharge status: Home / Self Care | Payer: Medicare (Managed Care) | Source: Ambulatory Visit | Attending: Vascular Surgery | Admitting: Vascular Surgery

## 2023-06-27 ENCOUNTER — Ambulatory Visit (INDEPENDENT_AMBULATORY_CARE_PROVIDER_SITE_OTHER): Payer: Medicare (Managed Care) | Admitting: Physician Assistant

## 2023-06-27 VITALS — BP 143/71 | HR 62 | Temp 98.3°F | Resp 18 | Ht 68.0 in | Wt 120.9 lb

## 2023-06-27 DIAGNOSIS — I70213 Atherosclerosis of native arteries of extremities with intermittent claudication, bilateral legs: Secondary | ICD-10-CM | POA: Diagnosis not present

## 2023-06-27 LAB — VAS US ABI WITH/WO TBI
Left ABI: 0.43
Right ABI: 0.79

## 2023-06-27 NOTE — Progress Notes (Signed)
[facility] [place] HISTORY AND PHYSICAL   History of Present Illness:  Patient is a 67 y.o. year old male who presents for evaluation of claudication.  He called the office and states he is unable to walk as far as he used to prior to have calf pain.  He was able to walk 1 mile before he developed claudication symptoms and now he can walk less than 100 yards before he has to stop and rest.  He has also developed edema in the right LE that does improve over night.  He denise wounds or rest pain.  His primary form of transportation is walking and this has become lifestyle limiting.   He is medically managed on a daily Statin.      Past Medical History:  Diagnosis Date   Asthma    childhood- young child to age 29   Hypertension     Past Surgical History:  Procedure Laterality Date   COLONOSCOPY     CYST EXCISION     throat   TOOTH EXTRACTION N/A 11/06/2020   Procedure: DENTAL RESTORATION/EXTRACTIONS;  Surgeon: Ocie Doyne, DDS;  Location: [facility] OR;  Service: Oral Surgery;  Laterality: N/A;    ROS:   General:  No weight loss, Fever, chills  HEENT: No recent headaches, no nasal bleeding, no visual changes, no sore throat  Neurologic: No dizziness, blackouts, seizures. No recent symptoms of stroke or mini- stroke. No recent episodes of slurred speech, or temporary blindness.  Cardiac: No recent episodes of chest pain/pressure, no shortness of breath at rest.  No shortness of breath with exertion.  Denies history of atrial fibrillation or irregular heartbeat  Vascular: No history of rest pain in feet.  No history of claudication.  No history of non-healing ulcer, No history of DVT   Pulmonary: No home oxygen, no productive cough, no hemoptysis,  No asthma or wheezing  Musculoskeletal:  [ ]  Arthritis, [ ]  Low back pain,  [ ]  Joint pain  Hematologic:No history of hypercoagulable state.  No history of easy bleeding.  No history of anemia  Gastrointestinal: No  hematochezia or melena,  No gastroesophageal reflux, no trouble swallowing  Urinary: [ ]  chronic Kidney disease, [ ]  on HD - [ ]  MWF or [ ]  TTHS, [ ]  Burning with urination, [ ]  Frequent urination, [ ]  Difficulty urinating;   Skin: No rashes  Psychological: No history of anxiety,  No history of depression  Social History Social History   Tobacco Use   Smoking status: Every Day    Packs/day: .12    Types: Cigarettes   Smokeless tobacco: Never   Tobacco comments:    3 daily  Vaping Use   Vaping Use: Never used  Substance Use Topics   Alcohol use: No    Comment: quit drinking 7 years ago after 30 years of drinking   Drug use: Yes    Types: Marijuana    Comment: last time early November 2021    Family History Family History  Problem Relation Age of Onset   Hypertension Mother    Cancer Mother    Hypertension Father    Diabetes Sister     Allergies  No Known Allergies   Current Outpatient Medications  Medication Sig Dispense Refill   amLODipine (NORVASC) 10 MG tablet Take 1 tablet (10 mg total) by mouth daily. 90 tablet 1   atorvastatin (LIPITOR) 20 MG tablet Take 1 tablet (20 mg total) by mouth daily. 90 tablet 1  EQ ALLERGY RELIEF, CETIRIZINE, 10 MG tablet Take 1 tablet by mouth once daily 90 tablet 0   ferrous sulfate 325 (65 FE) MG tablet Take 1 tablet (325 mg total) by mouth daily with breakfast. 90 tablet 0   fluticasone (FLONASE) 50 MCG/ACT nasal spray Use 2 spray(s) in each nostril once daily 48 g 0   lisinopril-hydrochlorothiazide (ZESTORETIC) 20-25 MG tablet Take 1 tablet by mouth daily. 90 tablet 1   sildenafil (VIAGRA) 50 MG tablet Take 1 tablet (50 mg total) by mouth daily as needed for erectile dysfunction. At least 24 hours between doses 10 tablet 1   No current facility-administered medications for this visit.    Physical Examination  Vitals:   06/27/23 1206  BP: (!) 143/71  Pulse: 62  Resp: 18  Temp: 98.3 F (36.8 C)  TempSrc: Temporal   SpO2: 98%  Weight: 120 lb 14.4 oz (54.8 kg)  Height: 5\' 8"  (1.727 m)    Body mass index is 18.38 kg/m.  General:  Alert and oriented, no acute distress HEENT: Normal Neck: No bruit or JVD Pulmonary: Clear to auscultation bilaterally Cardiac: Regular Rate and Rhythm without murmur Abdomen: Soft, non-tender, non-distended, no mass, no scars Skin: No rash Extremity Pulses:   Musculoskeletal: right LE  edema > left Neurologic: Upper and lower extremity motor grossly intact and symmetric  DATA:  ABI Findings:  +---------+------------------+-----+----------+--------+  Right   Rt Pressure (mmHg)IndexWaveform  Comment   +---------+------------------+-----+----------+--------+  Brachial 131                                        +---------+------------------+-----+----------+--------+  PTA     103               0.79 monophasic          +---------+------------------+-----+----------+--------+  DP      82                0.63 monophasic          +---------+------------------+-----+----------+--------+  Great Toe48                0.37 Abnormal            +---------+------------------+-----+----------+--------+   +---------+------------------+-----+----------+-------+  Left    Lt Pressure (mmHg)IndexWaveform  Comment  +---------+------------------+-----+----------+-------+  Brachial 120                                       +---------+------------------+-----+----------+-------+  PTA     56                0.43 monophasic         +---------+------------------+-----+----------+-------+  DP      53                0.40 monophasic         +---------+------------------+-----+----------+-------+  Great Toe28                0.21 Abnormal           +---------+------------------+-----+----------+-------+   +-------+-----------+-----------+------------+------------+  ABI/TBIToday's ABIToday's TBIPrevious ABIPrevious TBI   +-------+-----------+-----------+------------+------------+  Right 0.79       0.37                                 +-------+-----------+-----------+------------+------------+  Left  0.43       0.21                                 +-------+-----------+-----------+------------+------------+         Summary:  Right: Resting right ankle-brachial index indicates moderate right lower  extremity arterial disease. The right toe-brachial index is abnormal.   Left: Resting left ankle-brachial index indicates severe left lower  extremity arterial disease. The left toe-brachial index is abnormal.      ABI on 11/11/22 on the left was 0.69   ASSESSMENT/PLAN:  PAD with worsening claudication at shorter distances.  This is lifestyle limiting because he relies on walking for his transportation.  He denies rest pain or non healing wounds. He has developed edema in the right LE > left over the past 6 months.    The ABI today demonstrates a decrease in index from 0.69 to 0.43 in a 6 month period. I will schedule him for angiogram with possible intervention.  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        Mosetta Pigeon PA-C [facility] [place] Office: [phone]  MD in clinic [place]

## 2023-07-03 ENCOUNTER — Other Ambulatory Visit: Payer: Self-pay

## 2023-07-03 DIAGNOSIS — I70213 Atherosclerosis of native arteries of extremities with intermittent claudication, bilateral legs: Secondary | ICD-10-CM

## 2023-07-06 ENCOUNTER — Ambulatory Visit: Payer: Self-pay | Admitting: *Deleted

## 2023-07-06 ENCOUNTER — Encounter (HOSPITAL_COMMUNITY): Payer: Self-pay | Admitting: Emergency Medicine

## 2023-07-06 ENCOUNTER — Emergency Department (HOSPITAL_BASED_OUTPATIENT_CLINIC_OR_DEPARTMENT_OTHER): Payer: Medicare (Managed Care)

## 2023-07-06 ENCOUNTER — Ambulatory Visit (HOSPITAL_COMMUNITY)
Admission: RE | Admit: 2023-07-06 | Discharge: 2023-07-06 | Payer: Medicare (Managed Care) | Attending: Vascular Surgery | Admitting: Vascular Surgery

## 2023-07-06 ENCOUNTER — Encounter (HOSPITAL_COMMUNITY)
Admission: RE | Disposition: A | Discharge status: Other Acute Inpatient Hospital | Payer: Self-pay | Source: Home / Self Care | Attending: Vascular Surgery

## 2023-07-06 ENCOUNTER — Other Ambulatory Visit: Payer: Self-pay

## 2023-07-06 ENCOUNTER — Emergency Department (HOSPITAL_COMMUNITY)
Admission: EM | Admit: 2023-07-06 | Discharge: 2023-07-06 | Disposition: A | Discharge status: Home / Self Care | Payer: Medicare (Managed Care) | Source: Home / Self Care | Attending: Emergency Medicine | Admitting: Emergency Medicine

## 2023-07-06 DIAGNOSIS — Z79899 Other long term (current) drug therapy: Secondary | ICD-10-CM | POA: Insufficient documentation

## 2023-07-06 DIAGNOSIS — R2241 Localized swelling, mass and lump, right lower limb: Secondary | ICD-10-CM | POA: Insufficient documentation

## 2023-07-06 DIAGNOSIS — Z5329 Procedure and treatment not carried out because of patient's decision for other reasons: Secondary | ICD-10-CM | POA: Insufficient documentation

## 2023-07-06 DIAGNOSIS — I70213 Atherosclerosis of native arteries of extremities with intermittent claudication, bilateral legs: Secondary | ICD-10-CM | POA: Insufficient documentation

## 2023-07-06 DIAGNOSIS — E876 Hypokalemia: Secondary | ICD-10-CM

## 2023-07-06 DIAGNOSIS — M7989 Other specified soft tissue disorders: Secondary | ICD-10-CM

## 2023-07-06 DIAGNOSIS — I10 Essential (primary) hypertension: Secondary | ICD-10-CM | POA: Insufficient documentation

## 2023-07-06 DIAGNOSIS — F17210 Nicotine dependence, cigarettes, uncomplicated: Secondary | ICD-10-CM | POA: Diagnosis not present

## 2023-07-06 DIAGNOSIS — I739 Peripheral vascular disease, unspecified: Secondary | ICD-10-CM

## 2023-07-06 DIAGNOSIS — M79661 Pain in right lower leg: Secondary | ICD-10-CM | POA: Diagnosis not present

## 2023-07-06 DIAGNOSIS — D649 Anemia, unspecified: Secondary | ICD-10-CM | POA: Diagnosis not present

## 2023-07-06 DIAGNOSIS — Z538 Procedure and treatment not carried out for other reasons: Secondary | ICD-10-CM | POA: Insufficient documentation

## 2023-07-06 LAB — CBC
HCT: 26.5 % — ABNORMAL LOW (ref 39.0–52.0)
Hemoglobin: 7.8 g/dL — ABNORMAL LOW (ref 13.0–17.0)
MCH: 20.1 pg — ABNORMAL LOW (ref 26.0–34.0)
MCHC: 29.4 g/dL — ABNORMAL LOW (ref 30.0–36.0)
MCV: 68.3 fL — ABNORMAL LOW (ref 80.0–100.0)
Platelets: 357 10*3/uL (ref 150–400)
RBC: 3.88 MIL/uL — ABNORMAL LOW (ref 4.22–5.81)
RDW: 19.9 % — ABNORMAL HIGH (ref 11.5–15.5)
WBC: 5.4 10*3/uL (ref 4.0–10.5)
nRBC: 0 % (ref 0.0–0.2)

## 2023-07-06 LAB — POCT I-STAT, CHEM 8
BUN: 9 mg/dL (ref 8–23)
BUN: 9 mg/dL (ref 8–23)
Calcium, Ion: 1.17 mmol/L (ref 1.15–1.40)
Calcium, Ion: 1.19 mmol/L (ref 1.15–1.40)
Chloride: 105 mmol/L (ref 98–111)
Chloride: 105 mmol/L (ref 98–111)
Creatinine, Ser: 1.2 mg/dL (ref 0.61–1.24)
Creatinine, Ser: 1.2 mg/dL (ref 0.61–1.24)
Glucose, Bld: 91 mg/dL (ref 70–99)
Glucose, Bld: 93 mg/dL (ref 70–99)
HCT: 26 % — ABNORMAL LOW (ref 39.0–52.0)
HCT: 26 % — ABNORMAL LOW (ref 39.0–52.0)
Hemoglobin: 8.8 g/dL — ABNORMAL LOW (ref 13.0–17.0)
Hemoglobin: 8.8 g/dL — ABNORMAL LOW (ref 13.0–17.0)
Potassium: 2.6 mmol/L — CL (ref 3.5–5.1)
Potassium: 2.7 mmol/L — CL (ref 3.5–5.1)
Sodium: 142 mmol/L (ref 135–145)
Sodium: 142 mmol/L (ref 135–145)
TCO2: 24 mmol/L (ref 22–32)
TCO2: 24 mmol/L (ref 22–32)

## 2023-07-06 LAB — COMPREHENSIVE METABOLIC PANEL
ALT: 16 U/L (ref 0–44)
AST: 28 U/L (ref 15–41)
Albumin: 3.6 g/dL (ref 3.5–5.0)
Alkaline Phosphatase: 84 U/L (ref 38–126)
Anion gap: 14 (ref 5–15)
BUN: 10 mg/dL (ref 8–23)
CO2: 25 mmol/L (ref 22–32)
Calcium: 9.2 mg/dL (ref 8.9–10.3)
Chloride: 102 mmol/L (ref 98–111)
Creatinine, Ser: 1.22 mg/dL (ref 0.61–1.24)
GFR, Estimated: >60 mL/min (ref >60)
Glucose, Bld: 99 mg/dL (ref 70–99)
Potassium: 3.1 mmol/L — ABNORMAL LOW (ref 3.5–5.1)
Sodium: 141 mmol/L (ref 135–145)
Total Bilirubin: 0.8 mg/dL (ref 0.3–1.2)
Total Protein: 7.3 g/dL (ref 6.5–8.1)

## 2023-07-06 LAB — TYPE AND SCREEN
ABO/RH(D): O POS
Antibody Screen: NEGATIVE

## 2023-07-06 LAB — POC OCCULT BLOOD, ED: Fecal Occult Bld: NEGATIVE

## 2023-07-06 LAB — MAGNESIUM: Magnesium: 2.2 mg/dL (ref 1.7–2.4)

## 2023-07-06 SURGERY — ABDOMINAL AORTOGRAM W/LOWER EXTREMITY
Anesthesia: LOCAL

## 2023-07-06 MED ORDER — POLYETHYLENE GLYCOL 3350 17 G PO PACK: 17.0000 g | PACK | Freq: Every day | ORAL | 0 refills | Status: DC

## 2023-07-06 MED ORDER — FERROUS SULFATE 325 (65 FE) MG PO TABS: 325.0000 mg | ORAL_TABLET | Freq: Every day | ORAL | 0 refills | Status: DC

## 2023-07-06 MED ORDER — POTASSIUM CHLORIDE CRYS ER 20 MEQ PO TBCR: 20.0000 meq | EXTENDED_RELEASE_TABLET | Freq: Two times a day (BID) | ORAL | 0 refills | Status: DC

## 2023-07-06 MED ORDER — POTASSIUM CHLORIDE CRYS ER 20 MEQ PO TBCR
60.0000 meq | EXTENDED_RELEASE_TABLET | Freq: Once | ORAL | Status: AC
  Administered 2023-07-06: 60 meq via ORAL
  Filled 2023-07-06: qty 3

## 2023-07-06 MED ORDER — SODIUM CHLORIDE 0.9 % IV SOLN: INTRAVENOUS | Status: DC

## 2023-07-06 NOTE — Discharge Instructions (Signed)
You were seen for your low blood counts and low potassium in the emergency department.   At home, please take iron for low blood counts.  Please take MiraLAX to prevent any constipation from the iron.  Take potassium for the next 3 days.    Check your MyChart online for the results of any tests that had not resulted by the time you left the emergency department.   Follow-up with your primary doctor tomorrow days regarding your visit.    Return immediately to the emergency department if you experience any of the following: Chest pain, shortness of breath, dizziness, bloody stools, or any other concerning symptoms.    Thank you for visiting our Emergency Department. It was a pleasure taking care of you today.

## 2023-07-06 NOTE — Progress Notes (Signed)
Spoke with Clair Gulling, Statistician in ER and will bring client to ER and per Nicki, RN will leave  saline lock in

## 2023-07-06 NOTE — Telephone Encounter (Signed)
  Chief Complaint: critical labs results, requesting appt for patient now Symptoms: potassium 2.6 at 11:13 am 07/06/23. And Hbg 8.8. per RN Melanie.  NT did  not triage patient. Patient was scheduled for abdominal aortogram with lower extremity today and procedure canceled by physician . RN Shawna Orleans report physician wanted patient appt now with PCP.  Frequency: today  Pertinent Negatives: Patient denies na Disposition: [x] ED /[] Urgent Care (no appt availability in office) / [] Appointment(In office/virtual)/ []  [facility]/ [] Home Care/ [] Refused Recommended Disposition /[] [facility]/ []  Follow-up with PCP Additional Notes:  Recommended RN Melanie to send patient to ED now . Office would f/u with patient later time.  Called [phone] for assist from practice and no answer at that time.       Reason for Disposition  RN needs further essential information from caller in order to complete triage  Answer Assessment - Initial Assessment Questions 1. REASON FOR CALL or QUESTION: "What is your reason for calling today?" or "How can I best help you?" or "What question do you have that I can help answer?"     RN., Melanie from [facility] short stay called to report patient labs potassium 2.6 and hbg 8.8. requesting appt now  Protocols used: Information Only Call - No Triage-A-AH

## 2023-07-06 NOTE — Progress Notes (Signed)
Dr Chestine Spore in and procedure cancelled for today; Dr Baxter Flattery office called for followup per Dr Chestine Spore; called Dr Baxter Flattery office and per Oletha Cruel, RN at Dr Baxter Flattery office client should go to the ER

## 2023-07-06 NOTE — Progress Notes (Signed)
Client transferred to ER via wheelchair and report given to triage nurse

## 2023-07-06 NOTE — ED Notes (Signed)
Not in room

## 2023-07-06 NOTE — Telephone Encounter (Signed)
Patient is being evaluated in the ED now.

## 2023-07-06 NOTE — H&P (Signed)
History and Physical Interval Note:  07/06/2023 11:08 AM  Martin Mason  has presented today for surgery, with the diagnosis of tibal disease with claudication.  The various methods of treatment have been discussed with the patient and family. After consideration of risks, benefits and other options for treatment, the patient has consented to  Procedure(s): ABDOMINAL AORTOGRAM W/LOWER EXTREMITY (N/A) as a surgical intervention.  The patient's history has been reviewed, patient examined, no change in status, stable for surgery.  I have reviewed the patient's chart and labs.  Questions were answered to the patient's satisfaction.    Describes disabling claudication in both legs.  States both legs are about the same.  Discussed I would do right transfemoral access and get pictures of both legs but focus intervention on the left leg today.  [name]  [facility] [place] HISTORY AND PHYSICAL    History of Present Illness:  Patient is a 67 y.o. year old male who presents for evaluation of claudication.  He called the office and states he is unable to walk as far as he used to prior to have calf pain.  He was able to walk 1 mile before he developed claudication symptoms and now he can walk less than 100 yards before he has to stop and rest.  He has also developed edema in the right LE that does improve over night.  He denise wounds or rest pain.  His primary form of transportation is walking and this has become lifestyle limiting.   He is medically managed on a daily Statin.                      Past Medical History:  Diagnosis Date   Asthma      childhood- young child to age 67   Hypertension                 Past Surgical History:  Procedure Laterality Date   COLONOSCOPY       CYST EXCISION        throat   TOOTH EXTRACTION N/A 11/06/2020    Procedure: DENTAL RESTORATION/EXTRACTIONS;  Surgeon: Ocie Doyne, DDS;  Location: [facility] OR;  Service: Oral Surgery;   Laterality: N/A;          ROS:    General:  No weight loss, Fever, chills   HEENT: No recent headaches, no nasal bleeding, no visual changes, no sore throat   Neurologic: No dizziness, blackouts, seizures. No recent symptoms of stroke or mini- stroke. No recent episodes of slurred speech, or temporary blindness.   Cardiac: No recent episodes of chest pain/pressure, no shortness of breath at rest.  No shortness of breath with exertion.  Denies history of atrial fibrillation or irregular heartbeat   Vascular: No history of rest pain in feet.  No history of claudication.  No history of non-healing ulcer, No history of DVT    Pulmonary: No home oxygen, no productive cough, no hemoptysis,  No asthma or wheezing   Musculoskeletal:  [ ]  Arthritis, [ ]  Low back pain,  [ ]  Joint pain   Hematologic:No history of hypercoagulable state.  No history of easy bleeding.  No history of anemia   Gastrointestinal: No hematochezia or melena,  No gastroesophageal reflux, no trouble swallowing   Urinary: [ ]  chronic Kidney disease, [ ]  on HD - [ ]  MWF or [ ]  TTHS, [ ]  Burning with urination, [ ]  Frequent urination, [ ]  Difficulty  urinating;    Skin: No rashes   Psychological: No history of anxiety,  No history of depression   Social History Social History  Social History         Tobacco Use   Smoking status: Every Day      Packs/day: .12      Types: Cigarettes   Smokeless tobacco: Never   Tobacco comments:      3 daily  Vaping Use   Vaping Use: Never used  Substance Use Topics   Alcohol use: No      Comment: quit drinking 7 years ago after 30 years of drinking   Drug use: Yes      Types: Marijuana      Comment: last time early November 2021        Family History      Family History  Problem Relation Age of Onset   Hypertension Mother     Cancer Mother     Hypertension Father     Diabetes Sister            Allergies   Allergies  No Known Allergies              Current Outpatient Medications  Medication Sig Dispense Refill   amLODipine (NORVASC) 10 MG tablet Take 1 tablet (10 mg total) by mouth daily. 90 tablet 1   atorvastatin (LIPITOR) 20 MG tablet Take 1 tablet (20 mg total) by mouth daily. 90 tablet 1   EQ ALLERGY RELIEF, CETIRIZINE, 10 MG tablet Take 1 tablet by mouth once daily 90 tablet 0   ferrous sulfate 325 (65 FE) MG tablet Take 1 tablet (325 mg total) by mouth daily with breakfast. 90 tablet 0   fluticasone (FLONASE) 50 MCG/ACT nasal spray Use 2 spray(s) in each nostril once daily 48 g 0   lisinopril-hydrochlorothiazide (ZESTORETIC) 20-25 MG tablet Take 1 tablet by mouth daily. 90 tablet 1   sildenafil (VIAGRA) 50 MG tablet Take 1 tablet (50 mg total) by mouth daily as needed for erectile dysfunction. At least 24 hours between doses 10 tablet 1      No current facility-administered medications for this visit.        Physical Examination      Vitals:    06/27/23 1206  BP: (!) 143/71  Pulse: 62  Resp: 18  Temp: 98.3 F (36.8 C)  TempSrc: Temporal  SpO2: 98%  Weight: 120 lb 14.4 oz (54.8 kg)  Height: 5\' 8"  (1.727 m)      Body mass index is 18.38 kg/m.   General:  Alert and oriented, no acute distress HEENT: Normal Neck: No bruit or JVD Pulmonary: Clear to auscultation bilaterally Cardiac: Regular Rate and Rhythm without murmur Abdomen: Soft, non-tender, non-distended, no mass, no scars Skin: No rash Extremity Pulses:   Musculoskeletal: right LE  edema > left Neurologic: Upper and lower extremity motor grossly intact and symmetric   DATA:  ABI Findings:  +---------+------------------+-----+----------+--------+  Right   Rt Pressure (mmHg)IndexWaveform  Comment   +---------+------------------+-----+----------+--------+  Brachial 131                                        +---------+------------------+-----+----------+--------+  PTA     103               0.79 monophasic           +---------+------------------+-----+----------+--------+  DP  82                0.63 monophasic          +---------+------------------+-----+----------+--------+  Great Toe48                0.37 Abnormal            +---------+------------------+-----+----------+--------+   +---------+------------------+-----+----------+-------+  Left    Lt Pressure (mmHg)IndexWaveform  Comment  +---------+------------------+-----+----------+-------+  Brachial 120                                       +---------+------------------+-----+----------+-------+  PTA     56                0.43 monophasic         +---------+------------------+-----+----------+-------+  DP      53                0.40 monophasic         +---------+------------------+-----+----------+-------+  Great Toe28                0.21 Abnormal           +---------+------------------+-----+----------+-------+   +-------+-----------+-----------+------------+------------+  ABI/TBIToday's ABIToday's TBIPrevious ABIPrevious TBI  +-------+-----------+-----------+------------+------------+  Right 0.79       0.37                                 +-------+-----------+-----------+------------+------------+  Left  0.43       0.21                                 +-------+-----------+-----------+------------+------------+         Summary:  Right: Resting right ankle-brachial index indicates moderate right lower  extremity arterial disease. The right toe-brachial index is abnormal.   Left: Resting left ankle-brachial index indicates severe left lower  extremity arterial disease. The left toe-brachial index is abnormal.         ABI on 11/11/22 on the left was 0.69    ASSESSMENT/PLAN:  PAD with worsening claudication at shorter distances.  This is lifestyle limiting because he relies on walking for his transportation.  He denies rest pain or non healing wounds. He has developed  edema in the right LE > left over the past 6 months.               The ABI today demonstrates a decrease in index from 0.69 to 0.43 in a 6 month period. I will schedule him for angiogram with possible intervention.  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              Mosetta Pigeon PA-C [facility] [place] Office: [phone]   MD in clinic [place]

## 2023-07-06 NOTE — ED Triage Notes (Signed)
Patient brought down from surgical center- was supposed to have a lower extremity arteriogram but in preop the labs showed potassium 2.6 and hemoglobin of 8.8. patient denies any complaints other than being hungry. Denies any blood or dark stools.

## 2023-07-06 NOTE — ED Notes (Signed)
PT is in [place] and is pending discharge at this time

## 2023-07-06 NOTE — Progress Notes (Signed)
RLE venous duplex has been completed.  Preliminary results given to Dr. Eloise Harman.    Results can be found under chart review under CV PROC. 07/06/2023 5:01 PM Delaynee Alred RVT, RDMS

## 2023-07-06 NOTE — Progress Notes (Signed)
67 year old male scheduled for lower extremity arteriogram with possible intervention from the [facility] clinic for disabling claudication.  Preop labs suggest hypokalemia with a potassium of 2.6.  He also has acute on chronic anemia with a hemoglobin of 8.8.  He seems surprised by all of this.  He denies any bloody stools or dark stools.  He denies any recent changes to his medicines.  I discussed claudication is purely elective and we should delay his intervention until he is optimized from a clinical standpoint.  I will have him follow-up with his PCP in addition to arrange follow-up in our office in about 4 to 6 weeks to see how he is doing.  We have given him 60 mEq K-Dur today.  [name], MD [facility] [place] Office: [phone]   [name]

## 2023-07-06 NOTE — ED Provider Notes (Signed)
[place] EMERGENCY DEPARTMENT AT [facility] Provider Note   CSN: [id number] Arrival date & time: 07/06/23  1208     History  Chief Complaint  Patient presents with   Abnormal Lab    Martin Mason is a 67 y.o. male.  67 year old male with a history of peripheral artery disease, hypertension, tobacco use who presents to the emergency department with abnormal labs.  Patient has a history of right lower extremity claudication.  Had ABIs that show peripheral arterial disease and was to undergo an abdominal aortogram today.  He had preprocedural labs which showed a potassium of 2.6 and hemoglobin of 8.8 which appear to be new.  Says that he otherwise feels well.  No melena or hematochezia.  Unsure of if he has had a colonoscopy before and I am unable to find 1 per chart review.  Says that his right leg has been swollen for some time.  Was given 60 mill equivalents of potassium that he took prior to arrival.  Not on blood thinners.  No fatigue, dizziness, shortness of breath, or chest discomfort.       Home Medications Prior to Admission medications   Medication Sig Start Date End Date Taking? Authorizing Provider  ferrous sulfate 325 (65 FE) MG tablet Take 1 tablet (325 mg total) by mouth daily. 07/06/23  Yes Rondel Baton, MD  polyethylene glycol (MIRALAX) 17 g packet Take 17 g by mouth daily. 07/06/23  Yes Rondel Baton, MD  potassium chloride SA (KLOR-CON M) 20 MEQ tablet Take 1 tablet (20 mEq total) by mouth 2 (two) times daily for 3 days. 07/06/23 07/09/23 Yes Rondel Baton, MD  amLODipine (NORVASC) 10 MG tablet Take 1 tablet (10 mg total) by mouth daily. 02/14/23   Hoy Register, MD  atorvastatin (LIPITOR) 20 MG tablet Take 1 tablet (20 mg total) by mouth daily. 02/14/23   Hoy Register, MD  EQ ALLERGY RELIEF, CETIRIZINE, 10 MG tablet Take 1 tablet by mouth once daily 04/07/23   Hoy Register, MD  fluticasone (FLONASE) 50 MCG/ACT nasal spray Use 2 spray(s) in  each nostril once daily 06/08/23   Hoy Register, MD  lisinopril-hydrochlorothiazide (ZESTORETIC) 20-25 MG tablet Take 1 tablet by mouth daily. 02/14/23   Hoy Register, MD  sildenafil (VIAGRA) 50 MG tablet Take 1 tablet (50 mg total) by mouth daily as needed for erectile dysfunction. At least 24 hours between doses 04/28/21   Hoy Register, MD      Allergies    Patient has no known allergies.    Review of Systems   Review of Systems  Physical Exam Updated Vital Signs BP (!) 144/70   Pulse (!) 55   Temp 97.6 F (36.4 C) (Oral)   Resp 18   Ht 5\' 8"  (1.727 m)   Wt 54 kg   SpO2 100%   BMI 18.10 kg/m  Physical Exam Vitals and nursing note reviewed.  Constitutional:      General: He is not in acute distress.    Appearance: He is well-developed.  HENT:     Head: Normocephalic and atraumatic.     Right Ear: External ear normal.     Left Ear: External ear normal.     Nose: Nose normal.  Eyes:     Extraocular Movements: Extraocular movements intact.     Conjunctiva/sclera: Conjunctivae normal.     Pupils: Pupils are equal, round, and reactive to light.  Cardiovascular:     Rate and Rhythm: Normal rate and  regular rhythm.  Pulmonary:     Effort: Pulmonary effort is normal. No respiratory distress.  Musculoskeletal:     Cervical back: Normal range of motion and neck supple.     Right lower leg: Edema present.     Left lower leg: No edema.     Comments: Unable to palpate DP or PT pulse in right lower extremity.  This is consistent with prior vascular surgery exams.  Foot appears warm and well-perfused otherwise.  2+ edema right lower extremity.  No swelling of left lower extremity.  Skin:    General: Skin is warm and dry.  Neurological:     Mental Status: He is alert. Mental status is at baseline.  Psychiatric:        Mood and Affect: Mood normal.        Behavior: Behavior normal.     ED Results / Procedures / Treatments   Labs (all labs ordered are listed, but only  abnormal results are displayed) Labs Reviewed  COMPREHENSIVE METABOLIC PANEL - Abnormal; Notable for the following components:      Result Value   Potassium 3.1 (*)    All other components within normal limits  CBC - Abnormal; Notable for the following components:   RBC 3.88 (*)    Hemoglobin 7.8 (*)    HCT 26.5 (*)    MCV 68.3 (*)    MCH 20.1 (*)    MCHC 29.4 (*)    RDW 19.9 (*)    All other components within normal limits  MAGNESIUM  POC OCCULT BLOOD, ED  TYPE AND SCREEN    EKG EKG Interpretation Date/Time:  Thursday July 06 2023 12:17:13 EDT Ventricular Rate:  66 PR Interval:  200 QRS Duration:  88 QT Interval:  440 QTC Calculation: 461 R Axis:   33  Text Interpretation: Sinus rhythm with sinus arrhythmia with occasional Premature ventricular complexes Possible Left atrial enlargement Anteroseptal infarct , age undetermined Abnormal ECG When compared with ECG of 06-Jul-2023 10:39,  PVCs now present Confirmed by Vonita Moss [phone]) on 07/06/2023 2:53:31 PM  Radiology No results found.  Procedures Procedures    Medications Ordered in ED Medications - No data to display  ED Course/ Medical Decision Making/ A&P                             Medical Decision Making Amount and/or Complexity of Data Reviewed Labs: ordered.  Risk OTC drugs. Prescription drug management.   Martin Mason is a 67 y.o. male with comorbidities that complicate the patient evaluation including peripheral artery disease, hypertension, tobacco use who presents to the emergency department with abnormal labs.    Initial Ddx:  Anemia, GI bleed, lab error, DVT, PAD, limb ischemia  MDM/Course:  Patient arrives with low hemoglobin and potassium but is otherwise asymptomatic.  No melena or hematochezia.  On exam does also have right lower extremity swelling and I do not see any history of DVT ultrasound being ordered for him so we will order one today.  Unable to palpate pulses in his right  lower extremity but did have a vascular surgery appointment today and this appears to be chronic for him.  Not have any worsening pain of his foot that would be suggestive of acute limb ischemia.  Overall well-appearing on exam and EKG did not show any U waves or signs of hypokalemia.  His repeat chemistry here showed that his potassium is 3.1 and his  magnesium was WNL.  Did show a drop in his hemoglobin from earlier today of 8.8-7.8 with his last prior being a year ago when it was 10.  His Hemoccult was negative in the emergency department.  Upon re-evaluation patient was requesting to leave AGAINST MEDICAL ADVICE.  Expressed to him that it was important to stay for treatment of his anemia and find out the etiology as well as for the ultrasound of his leg.  Did express that he could have permanent disability or death but he stated that he wanted to go regardless.  Do feel that he has capacity to make this decision.  Will have the patient follow-up with his primary doctor tomorrow and take iron and potassium supplementation at home.  Return precautions discussed prior to discharge  This patient presents to the ED for concern of complaints listed in HPI, this involves an extensive number of treatment options, and is a complaint that carries with it a high risk of complications and morbidity. Disposition including potential need for admission considered.   Dispo: AMA  Records reviewed Outpatient Clinic Notes The following labs were independently interpreted: Chemistry and CBC and show acute anemia I personally reviewed and interpreted cardiac monitoring: normal sinus rhythm  I personally reviewed and interpreted the pt's EKG: see above for interpretation  I have reviewed the patients home medications and made adjustments as needed Social Determinants of health:  Elderly         Final Clinical Impression(s) / ED Diagnoses Final diagnoses:  Anemia, unspecified type  Hypokalemia  Leg swelling   PAD (peripheral artery disease) (HCC)    Rx / DC Orders ED Discharge Orders          Ordered    ferrous sulfate 325 (65 FE) MG tablet  Daily        07/06/23 1626    polyethylene glycol (MIRALAX) 17 g packet  Daily        07/06/23 1626    potassium chloride SA (KLOR-CON M) 20 MEQ tablet  2 times daily        07/06/23 1626              Rondel Baton, MD 07/06/23 1631

## 2023-07-06 NOTE — ED Notes (Signed)
Patient transported to Ultrasound 

## 2023-07-13 ENCOUNTER — Other Ambulatory Visit: Payer: Self-pay | Admitting: Family Medicine

## 2023-07-13 DIAGNOSIS — R430 Anosmia: Secondary | ICD-10-CM

## 2023-08-15 ENCOUNTER — Ambulatory Visit (INDEPENDENT_AMBULATORY_CARE_PROVIDER_SITE_OTHER): Payer: Medicare (Managed Care) | Admitting: Vascular Surgery

## 2023-08-15 ENCOUNTER — Encounter: Payer: Self-pay | Admitting: Vascular Surgery

## 2023-08-15 VITALS — BP 139/70 | HR 69 | Temp 98.2°F | Wt 119.0 lb

## 2023-08-15 DIAGNOSIS — I70213 Atherosclerosis of native arteries of extremities with intermittent claudication, bilateral legs: Secondary | ICD-10-CM

## 2023-08-15 NOTE — Progress Notes (Signed)
Patient name: Martin Mason MRN: [id number] DOB: 10-13-1956 Sex: male  REASON FOR CONSULT: Hospital follow-up for delayed lower extremity angiogram  HPI: Martin Mason is a 67 y.o. male, with history of hypertension that presents for hospital follow-up after delayed left lower extremity angiogram on 07/06/2023.  He was initially scheduled for left lower extremity angiogram for disabling claudication.  This was canceled due to severe hypokalemia of 2.6 and also acute on chronic anemia with a hemoglobin of 7.8 noted on pre-procedure labs.  States he has follow-up with his PCP next month.  He was seen in the ED and left AMA.  He can walk half a mile before he has to stop and then walks another half a mile home.  Get some cramping actually worse in the right leg per his report today.  Still smoking.    Past Medical History:  Diagnosis Date   Asthma    childhood- young child to age 44   Hypertension     Past Surgical History:  Procedure Laterality Date   COLONOSCOPY     CYST EXCISION     throat   TOOTH EXTRACTION N/A 11/06/2020   Procedure: DENTAL RESTORATION/EXTRACTIONS;  Surgeon: Ocie Doyne, DDS;  Location: [facility] OR;  Service: Oral Surgery;  Laterality: N/A;    Family History  Problem Relation Age of Onset   Hypertension Mother    Cancer Mother    Hypertension Father    Diabetes Sister     SOCIAL HISTORY: Social History   Socioeconomic History   Marital status: Divorced    Spouse name: Not on file   Number of children: Not on file   Years of education: Not on file   Highest education level: Not on file  Occupational History   Not on file  Tobacco Use   Smoking status: Every Day    Current packs/day: 0.12    Types: Cigarettes   Smokeless tobacco: Never   Tobacco comments:    3 daily  Vaping Use   Vaping status: Never Used  Substance and Sexual Activity   Alcohol use: No    Comment: quit drinking 7 years ago after 30 years of drinking   Drug use: Yes    Types:  Marijuana    Comment: last time early November 2021   Sexual activity: Not on file  Other Topics Concern   Not on file  Social History Narrative   Not on file   Social Determinants of Health   Financial Resource Strain: Low Risk  (02/06/2023)   Overall Financial Resource Strain (CARDIA)    Difficulty of Paying Living Expenses: Not hard at all  Food Insecurity: No Food Insecurity (02/06/2023)   Hunger Vital Sign    Worried About Running Out of Food in the Last Year: Never true    Ran Out of Food in the Last Year: Never true  Transportation Needs: No Transportation Needs (02/06/2023)   PRAPARE - Administrator, Civil Service (Medical): No    Lack of Transportation (Non-Medical): No  Physical Activity: Sufficiently Active (02/06/2023)   Exercise Vital Sign    Days of Exercise per Week: 7 days    Minutes of Exercise per Session: 30 min  Stress: No Stress Concern Present (02/06/2023)   [employer] of Occupational Health - Occupational Stress Questionnaire    Feeling of Stress : Not at all  Social Connections: Moderately Isolated (02/06/2023)   Social Connection and Isolation Panel [NHANES]    Frequency of Communication  with Friends and Family: Three times a week    Frequency of Social Gatherings with Friends and Family: Three times a week    Attends Religious Services: 1 to 4 times per year    Active Member of Clubs or Organizations: No    Attends Banker Meetings: Never    Marital Status: Divorced  Catering manager Violence: Not At Risk (02/06/2023)   Humiliation, Afraid, Rape, and Kick questionnaire    Fear of Current or Ex-Partner: No    Emotionally Abused: No    Physically Abused: No    Sexually Abused: No    No Known Allergies  Current Outpatient Medications  Medication Sig Dispense Refill   amLODipine (NORVASC) 10 MG tablet Take 1 tablet (10 mg total) by mouth daily. 90 tablet 1   atorvastatin (LIPITOR) 20 MG tablet Take 1 tablet (20 mg total)  by mouth daily. 90 tablet 1   EQ ALLERGY RELIEF, CETIRIZINE, 10 MG tablet Take 1 tablet by mouth once daily 90 tablet 0   ferrous sulfate 325 (65 FE) MG tablet Take 1 tablet (325 mg total) by mouth daily. 30 tablet 0   fluticasone (FLONASE) 50 MCG/ACT nasal spray Use 2 spray(s) in each nostril once daily 48 g 0   lisinopril-hydrochlorothiazide (ZESTORETIC) 20-25 MG tablet Take 1 tablet by mouth daily. 90 tablet 1   polyethylene glycol (MIRALAX) 17 g packet Take 17 g by mouth daily. 30 each 0   sildenafil (VIAGRA) 50 MG tablet Take 1 tablet (50 mg total) by mouth daily as needed for erectile dysfunction. At least 24 hours between doses 10 tablet 1   potassium chloride SA (KLOR-CON M) 20 MEQ tablet Take 1 tablet (20 mEq total) by mouth 2 (two) times daily for 3 days. 6 tablet 0   No current facility-administered medications for this visit.    REVIEW OF SYSTEMS:  [X]  denotes positive finding, [ ]  denotes negative finding Cardiac  Comments:  Chest pain or chest pressure:    Shortness of breath upon exertion:    Short of breath when lying flat:    Irregular heart rhythm:        Vascular    Pain in calf, thigh, or hip brought on by ambulation: x   Pain in feet at night that wakes you up from your sleep:     Blood clot in your veins:    Leg swelling:         Pulmonary    Oxygen at home:    Productive cough:     Wheezing:         Neurologic    Sudden weakness in arms or legs:     Sudden numbness in arms or legs:     Sudden onset of difficulty speaking or slurred speech:    Temporary loss of vision in one eye:     Problems with dizziness:         Gastrointestinal    Blood in stool:     Vomited blood:         Genitourinary    Burning when urinating:     Blood in urine:        Psychiatric    Major depression:         Hematologic    Bleeding problems:    Problems with blood clotting too easily:        Skin    Rashes or ulcers:        Constitutional  Fever or chills:       PHYSICAL EXAM: Vitals:   08/15/23 1413  BP: 139/70  Pulse: 69  Temp: 98.2 F (36.8 C)  TempSrc: Temporal  SpO2: 98%  Weight: 119 lb (54 kg)    GENERAL: The patient is a well-nourished male, in no acute distress. The vital signs are documented above. CARDIAC: There is a regular rate and rhythm.  VASCULAR:  Bilateral femoral pulses palpable No palpable pedal pulses No lower extremity tissue loss PULMONARY: No respiratory distress. ABDOMEN: Soft and non-tender. MUSCULOSKELETAL: There are no major deformities or cyanosis. NEUROLOGIC: No focal weakness or paresthesias are detected. SKIN: There are no ulcers or rashes noted. PSYCHIATRIC: The patient has a normal affect.  DATA:   ABIs on 06/27/23 were 0.79 on the right monophasic and 0.43 on the left monophasic  Assessment/Plan:   67 y.o. male, with history of hypertension that presents for hospital follow-up after delayed left lower extremity angiogram on 07/06/2023. This was canceled due to severe hypokalemia of 2.6 and also acute on chronic anemia with a hemoglobin of 7.8 on pre-procedure labs.  He was seen in the ED and ruled out for GI bleed with negative Hemoccult and then apparently left before completing the evaluation.  I discussed he needs to maintain his follow-up with his PCP next month.  His claudication does not seem disabling at this time.  He states he can walk half a mile before he has to stop and then can walk another half a mile.  I have recommended delaying any intervention at this time since he can truly walk those distances.  I discussed we reserve intervention on claudication when folks have failed conservative management and this becomes disabling.  I will see him in 6 months with ABIs.  Discussed smoking cessation.   [name], MD [facility] Office: [phone]

## 2023-08-17 ENCOUNTER — Other Ambulatory Visit: Payer: Self-pay | Admitting: Family Medicine

## 2023-08-17 DIAGNOSIS — N528 Other male erectile dysfunction: Secondary | ICD-10-CM

## 2023-09-05 ENCOUNTER — Other Ambulatory Visit: Payer: Self-pay

## 2023-09-05 DIAGNOSIS — I70213 Atherosclerosis of native arteries of extremities with intermittent claudication, bilateral legs: Secondary | ICD-10-CM

## 2023-09-08 ENCOUNTER — Other Ambulatory Visit: Payer: Self-pay | Admitting: Family Medicine

## 2023-09-08 DIAGNOSIS — R430 Anosmia: Secondary | ICD-10-CM

## 2023-09-08 DIAGNOSIS — I10 Essential (primary) hypertension: Secondary | ICD-10-CM

## 2023-09-18 ENCOUNTER — Ambulatory Visit: Payer: Medicare (Managed Care) | Admitting: Family Medicine

## 2023-09-30 ENCOUNTER — Other Ambulatory Visit: Payer: Self-pay | Admitting: Family Medicine

## 2023-09-30 DIAGNOSIS — I10 Essential (primary) hypertension: Secondary | ICD-10-CM

## 2023-10-02 NOTE — Telephone Encounter (Signed)
Requested Prescriptions  Pending Prescriptions Disp Refills   lisinopril-hydrochlorothiazide (ZESTORETIC) 20-25 MG tablet [Pharmacy Med Name: Lisinopril-hydroCHLOROthiazide 20-25 MG Oral Tablet] 90 tablet 0    Sig: Take 1 tablet by mouth once daily     Cardiovascular:  ACEI + Diuretic Combos Failed - 09/30/2023  6:51 AM      Failed - K in normal range and within 180 days    Potassium  Date Value Ref Range Status  07/06/2023 3.1 (L) 3.5 - 5.1 mmol/L Final         Passed - Na in normal range and within 180 days    Sodium  Date Value Ref Range Status  07/06/2023 141 135 - 145 mmol/L Final  02/14/2023 141 134 - 144 mmol/L Final         Passed - Cr in normal range and within 180 days    Creat  Date Value Ref Range Status  03/01/2017 1.24 0.70 - 1.25 mg/dL Final    Comment:      For patients > or = 69 years of age: The upper reference limit for Creatinine is approximately 13% higher for people identified as African-American.      Creatinine, Ser  Date Value Ref Range Status  07/06/2023 1.22 0.61 - 1.24 mg/dL Final         Passed - eGFR is 30 or above and within 180 days    GFR, Est African American  Date Value Ref Range Status  03/01/2017 72 >=60 mL/min Final   GFR calc Af Amer  Date Value Ref Range Status  11/12/2020 80 >59 mL/min/1.73 Final    Comment:    **In accordance with recommendations from the NKF-ASN Task force,**   [facility] is in the process of updating its eGFR calculation to the   2021 CKD-EPI creatinine equation that estimates kidney function   without a race variable.    GFR, Est Non African American  Date Value Ref Range Status  03/01/2017 62 >=60 mL/min Final   GFR, Estimated  Date Value Ref Range Status  07/06/2023 >60 >60 mL/min Final    Comment:    (NOTE) Calculated using the CKD-EPI Creatinine Equation (2021)    eGFR  Date Value Ref Range Status  02/14/2023 69 >59 mL/min/1.73 Final         Passed - Patient is not pregnant      Passed  - Last BP in normal range    BP Readings from Last 1 Encounters:  08/15/23 139/70         Passed - Valid encounter within last 6 months    Recent Outpatient Visits           5 months ago Medication management   [facility] [place], Cornelius Moras, RPH-CPP   6 months ago Essential hypertension   [facility] & [facility] [place], Odette Horns, MD   7 months ago Tobacco dependence   [place] [facility] Hoy Register, MD   10 months ago Essential hypertension   [place] Hoy Register, MD   1 year ago Anosmia   [facility] Hoy Register, MD       Future Appointments             In 2 months Hoy Register, MD [facility]

## 2023-10-08 ENCOUNTER — Other Ambulatory Visit: Payer: Self-pay | Admitting: Family Medicine

## 2023-10-08 DIAGNOSIS — I739 Peripheral vascular disease, unspecified: Secondary | ICD-10-CM

## 2023-10-09 ENCOUNTER — Other Ambulatory Visit: Payer: Self-pay | Admitting: Family Medicine

## 2023-10-09 DIAGNOSIS — I739 Peripheral vascular disease, unspecified: Secondary | ICD-10-CM

## 2023-10-09 NOTE — Telephone Encounter (Signed)
Requested medications are due for refill today.  yes  Requested medications are on the active medications list.  yes  Last refill. 02/14/2023 #90 1 rf  Future visit scheduled.   yes  Notes to clinic.  Labs are expired.    Requested Prescriptions  Pending Prescriptions Disp Refills   atorvastatin (LIPITOR) 20 MG tablet [Pharmacy Med Name: Atorvastatin Calcium 20 MG Oral Tablet] 30 tablet 0    Sig: Take 1 tablet by mouth once daily     Cardiovascular:  Antilipid - Statins Failed - 10/08/2023 11:15 AM      Failed - Lipid Panel in normal range within the last 12 months    Cholesterol, Total  Date Value Ref Range Status  07/03/2019 156 100 - 199 mg/dL Final   LDL Calculated  Date Value Ref Range Status  07/03/2019 89 0 - 99 mg/dL Final   HDL  Date Value Ref Range Status  07/03/2019 54 >39 mg/dL Final   Triglycerides  Date Value Ref Range Status  07/03/2019 67 0 - 149 mg/dL Final         Passed - Patient is not pregnant      Passed - Valid encounter within last 12 months    Recent Outpatient Visits           6 months ago Medication management   [facility] Center [place], Cornelius Moras, RPH-CPP   6 months ago Essential hypertension   [place] [facility] [place], Odette Horns, MD   7 months ago Tobacco dependence   [place] [facility] Hoy Register, MD   11 months ago Essential hypertension   [facility] Hoy Register, MD   1 year ago Anosmia   [place] [facility] Hoy Register, MD       Future Appointments             In 2 months Hoy Register, MD [facility]

## 2023-10-10 NOTE — Telephone Encounter (Signed)
Requested medication (s) are due for refill today - yes  Requested medication (s) are on the active medication list -yes  Future visit scheduled -yes  Last refill: 02/14/23 #90 1RF  Notes to clinic: fails lab protocol- over 1 year- 07/03/19  Requested Prescriptions  Pending Prescriptions Disp Refills   atorvastatin (LIPITOR) 20 MG tablet [Pharmacy Med Name: Atorvastatin Calcium 20 MG Oral Tablet] 30 tablet 0    Sig: Take 1 tablet by mouth once daily     Cardiovascular:  Antilipid - Statins Failed - 10/09/2023  4:19 PM      Failed - Lipid Panel in normal range within the last 12 months    Cholesterol, Total  Date Value Ref Range Status  07/03/2019 156 100 - 199 mg/dL Final   LDL Calculated  Date Value Ref Range Status  07/03/2019 89 0 - 99 mg/dL Final   HDL  Date Value Ref Range Status  07/03/2019 54 >39 mg/dL Final   Triglycerides  Date Value Ref Range Status  07/03/2019 67 0 - 149 mg/dL Final         Passed - Patient is not pregnant      Passed - Valid encounter within last 12 months    Recent Outpatient Visits           6 months ago Medication management   [facility] Center [place], RPH-CPP   6 months ago Essential hypertension   [place] [facility] [place], Odette Horns, MD   7 months ago Tobacco dependence   [place] [facility] Hoy Register, MD   11 months ago Essential hypertension   [place] [facility] Center [place], [place], MD   1 year ago Anosmia   [place] [facility] [place], Odette Horns, MD       Future Appointments             In 2 months Hoy Register, MD [place] [facility]               Requested Prescriptions  Pending Prescriptions Disp Refills   atorvastatin (LIPITOR) 20 MG tablet [Pharmacy Med Name: Atorvastatin Calcium 20 MG Oral Tablet] 30 tablet 0    Sig: Take 1  tablet by mouth once daily     Cardiovascular:  Antilipid - Statins Failed - 10/09/2023  4:19 PM      Failed - Lipid Panel in normal range within the last 12 months    Cholesterol, Total  Date Value Ref Range Status  07/03/2019 156 100 - 199 mg/dL Final   LDL Calculated  Date Value Ref Range Status  07/03/2019 89 0 - 99 mg/dL Final   HDL  Date Value Ref Range Status  07/03/2019 54 >39 mg/dL Final   Triglycerides  Date Value Ref Range Status  07/03/2019 67 0 - 149 mg/dL Final         Passed - Patient is not pregnant      Passed - Valid encounter within last 12 months    Recent Outpatient Visits           6 months ago Medication management   [facility] [place], Cornelius Moras, RPH-CPP   6 months ago Essential hypertension   [facility] Hoy Register, MD   7 months ago Tobacco dependence   [facility]  [facility]ness Center [place], Odette Horns, MD   11 months ago Essential hypertension   [facility] Center [place], Odette Horns, MD   1 year ago Anosmia   [place] [facility] Hoy Register, MD       Future Appointments             In 2 months Hoy Register, MD [facility]

## 2023-10-25 ENCOUNTER — Other Ambulatory Visit: Payer: Self-pay | Admitting: Pharmacist

## 2023-10-25 NOTE — Progress Notes (Signed)
Pharmacy Quality Measure Review  This patient is appearing on a report for being at risk of failing the adherence measure for cholesterol (statin) and hypertension (ACEi/ARB) medications this calendar year.   Medication: atorvastatin Last fill date: 10/11/23 for 90 day supply  Insurance report was not up to date. No action needed at this time.   Medication: lisinopril-hydrochlorothiazide  Last fill date: 09/08/23 for 90 day supply  Insurance report was not up to date. No action needed at this time.   [name], PharmD, Patsy Baltimore, CPP Clinical Pharmacist [facility] [phone]

## 2023-12-17 ENCOUNTER — Other Ambulatory Visit: Payer: Self-pay | Admitting: Family Medicine

## 2023-12-17 DIAGNOSIS — I739 Peripheral vascular disease, unspecified: Secondary | ICD-10-CM

## 2023-12-17 DIAGNOSIS — R430 Anosmia: Secondary | ICD-10-CM

## 2023-12-17 DIAGNOSIS — I10 Essential (primary) hypertension: Secondary | ICD-10-CM

## 2023-12-26 ENCOUNTER — Ambulatory Visit: Payer: Medicare (Managed Care) | Admitting: Family Medicine

## 2024-01-17 ENCOUNTER — Encounter: Payer: Self-pay | Admitting: Family Medicine

## 2024-01-17 ENCOUNTER — Ambulatory Visit: Payer: Medicare (Managed Care) | Attending: Family Medicine | Admitting: Family Medicine

## 2024-01-17 VITALS — BP 130/56 | HR 66 | Ht 68.0 in | Wt 139.4 lb

## 2024-01-17 DIAGNOSIS — I10 Essential (primary) hypertension: Secondary | ICD-10-CM | POA: Diagnosis not present

## 2024-01-17 DIAGNOSIS — Z720 Tobacco use: Secondary | ICD-10-CM

## 2024-01-17 DIAGNOSIS — R0609 Other forms of dyspnea: Secondary | ICD-10-CM | POA: Diagnosis not present

## 2024-01-17 DIAGNOSIS — R600 Localized edema: Secondary | ICD-10-CM

## 2024-01-17 DIAGNOSIS — I739 Peripheral vascular disease, unspecified: Secondary | ICD-10-CM | POA: Diagnosis not present

## 2024-01-17 DIAGNOSIS — E876 Hypokalemia: Secondary | ICD-10-CM

## 2024-01-17 MED ORDER — FUROSEMIDE 20 MG PO TABS: 20.0000 mg | ORAL_TABLET | Freq: Every day | ORAL | 1 refills | Status: DC | PRN

## 2024-01-17 MED ORDER — OMEPRAZOLE 40 MG PO CPDR
40.0000 mg | DELAYED_RELEASE_CAPSULE | Freq: Every day | ORAL | 3 refills | Status: DC
Start: 2024-01-17 — End: 2024-06-30

## 2024-01-17 MED ORDER — LISINOPRIL-HYDROCHLOROTHIAZIDE 20-25 MG PO TABS: 1.0000 | ORAL_TABLET | Freq: Every day | ORAL | 1 refills | Status: DC

## 2024-01-17 MED ORDER — AMLODIPINE BESYLATE 10 MG PO TABS: 10.0000 mg | ORAL_TABLET | Freq: Every day | ORAL | 1 refills | Status: DC

## 2024-01-17 MED ORDER — ATORVASTATIN CALCIUM 20 MG PO TABS: 20.0000 mg | ORAL_TABLET | Freq: Every day | ORAL | 1 refills | Status: DC

## 2024-01-17 NOTE — Progress Notes (Signed)
Subjective:  Patient ID: Martin Mason, male    DOB: [birth year]  Age: 68 y.o. MRN: [id number]  CC: Medical Management of Chronic Issues (Swelling in lower legs/SOB/Heartburn)   HPI Martin Mason is a 68 y.o. year old male with a history of hypertension, PAD, tobacco abuse here for a follow-up visit.    Interval History: Discussed the use of AI scribe software for clinical note transcription with the patient, who gave verbal consent to proceed.   The patient presents with heartburn and leg swelling. The heartburn is transient and relieved by a small dose of baking soda and water. He suspects it may be related to his diet, which includes baloney and junk food. He also reports swelling in his legs and knees, which worsens with walking and improves with rest. He has seen Dr. Chestine Spore, a vascular doctor, for peripheral arterial disease, which is likely contributing to the leg swelling and per notes he was to follow-up in 6 months to discuss further intervention.  His appointment comes up in 01/2024.Marland Kitchen He also reports shortness of breath, which has been present for about a week and is most noticeable when walking around a lot. The shortness of breath seems to coincide with episodes of heartburn.        Past Medical History:  Diagnosis Date   Asthma    childhood- young child to age 4   Hypertension     Past Surgical History:  Procedure Laterality Date   COLONOSCOPY     CYST EXCISION     throat   TOOTH EXTRACTION N/A 11/06/2020   Procedure: DENTAL RESTORATION/EXTRACTIONS;  Surgeon: Ocie Doyne, DDS;  Location: [facility] OR;  Service: Oral Surgery;  Laterality: N/A;    Family History  Problem Relation Age of Onset   Hypertension Mother    Cancer Mother    Hypertension Father    Diabetes Sister     Social History   Socioeconomic History   Marital status: Divorced    Spouse name: Not on file   Number of children: Not on file   Years of education: Not on file   Highest education level: Not  on file  Occupational History   Not on file  Tobacco Use   Smoking status: Every Day    Current packs/day: 0.12    Types: Cigarettes   Smokeless tobacco: Never   Tobacco comments:    3 daily  Vaping Use   Vaping status: Never Used  Substance and Sexual Activity   Alcohol use: No    Comment: quit drinking 7 years ago after 30 years of drinking   Drug use: Yes    Types: Marijuana    Comment: last time early November 2021   Sexual activity: Not on file  Other Topics Concern   Not on file  Social History Narrative   Not on file   Social Drivers of Health   Financial Resource Strain: Low Risk  (02/06/2023)   Overall Financial Resource Strain (CARDIA)    Difficulty of Paying Living Expenses: Not hard at all  Food Insecurity: No Food Insecurity (02/06/2023)   Hunger Vital Sign    Worried About Running Out of Food in the Last Year: Never true    Ran Out of Food in the Last Year: Never true  Transportation Needs: No Transportation Needs (02/06/2023)   PRAPARE - Administrator, Civil Service (Medical): No    Lack of Transportation (Non-Medical): No  Physical Activity: Sufficiently Active (02/06/2023)   Exercise  Vital Sign    Days of Exercise per Week: 7 days    Minutes of Exercise per Session: 30 min  Stress: No Stress Concern Present (02/06/2023)   [employer] of Occupational Health - Occupational Stress Questionnaire    Feeling of Stress : Not at all  Social Connections: Moderately Isolated (02/06/2023)   Social Connection and Isolation Panel [NHANES]    Frequency of Communication with Friends and Family: Three times a week    Frequency of Social Gatherings with Friends and Family: Three times a week    Attends Religious Services: 1 to 4 times per year    Active Member of Clubs or Organizations: No    Attends Banker Meetings: Never    Marital Status: Divorced    No Known Allergies  Outpatient Medications Prior to Visit  Medication Sig Dispense  Refill   EQ ALLERGY RELIEF, CETIRIZINE, 10 MG tablet Take 1 tablet by mouth once daily 90 tablet 0   ferrous sulfate 325 (65 FE) MG tablet Take 1 tablet (325 mg total) by mouth daily. 30 tablet 0   fluticasone (FLONASE) 50 MCG/ACT nasal spray Use 2 spray(s) in each nostril once daily 48 g 0   polyethylene glycol (MIRALAX) 17 g packet Take 17 g by mouth daily. 30 each 0   sildenafil (VIAGRA) 50 MG tablet TAKE 1 TABLET BY MOUTH ONCE DAILY AS NEEDED FOR ERECTILE DYSFUNCTION AT  LEAST  24  HOURS  BETWEEN  DOSES 10 tablet 0   amLODipine (NORVASC) 10 MG tablet Take 1 tablet by mouth once daily 90 tablet 0   atorvastatin (LIPITOR) 20 MG tablet Take 1 tablet by mouth once daily 90 tablet 0   lisinopril-hydrochlorothiazide (ZESTORETIC) 20-25 MG tablet Take 1 tablet by mouth once daily 90 tablet 0   potassium chloride SA (KLOR-CON M) 20 MEQ tablet Take 1 tablet (20 mEq total) by mouth 2 (two) times daily for 3 days. 6 tablet 0   No facility-administered medications prior to visit.     ROS Review of Systems  Constitutional:  Negative for activity change and appetite change.  HENT:  Negative for sinus pressure and sore throat.   Respiratory:  Negative for chest tightness, shortness of breath and wheezing.   Cardiovascular:  Positive for leg swelling. Negative for chest pain and palpitations.  Gastrointestinal:  Negative for abdominal distention, abdominal pain and constipation.  Genitourinary: Negative.   Musculoskeletal: Negative.   Psychiatric/Behavioral:  Negative for behavioral problems and dysphoric mood.     Objective:  BP (!) 130/56   Pulse 66   Ht 5\' 8"  (1.727 m)   Wt 139 lb 6.4 oz (63.2 kg)   SpO2 99%   BMI 21.20 kg/m      01/17/2024    2:06 PM 08/15/2023    2:13 PM 07/06/2023    4:59 PM  BP/Weight  Systolic BP 130 139 132  Diastolic BP 56 70 71  Wt. (Lbs) 139.4 119   BMI 21.2 kg/m2 18.09 kg/m2       Physical Exam Constitutional:      Appearance: He is well-developed.   Cardiovascular:     Rate and Rhythm: Normal rate.     Heart sounds: Normal heart sounds. No murmur heard. Pulmonary:     Effort: Pulmonary effort is normal.     Breath sounds: Normal breath sounds. No wheezing or rales.  Chest:     Chest wall: No tenderness.  Abdominal:     General: Bowel sounds are  normal. There is no distension.     Palpations: Abdomen is soft. There is no mass.     Tenderness: There is no abdominal tenderness.  Musculoskeletal:        General: Normal range of motion.     Right lower leg: Edema present.     Left lower leg: Edema present.  Neurological:     Mental Status: He is alert and oriented to person, place, and time.  Psychiatric:        Mood and Affect: Mood normal.        Latest Ref Rng & Units 07/06/2023    2:48 PM 07/06/2023   11:13 AM 07/06/2023   10:59 AM  CMP  Glucose 70 - 99 mg/dL 99  91  93   BUN 8 - 23 mg/dL 10  9  9    Creatinine 0.61 - 1.24 mg/dL 4.09  8.11  9.14   Sodium 135 - 145 mmol/L 141  142  142   Potassium 3.5 - 5.1 mmol/L 3.1  2.6  2.7   Chloride 98 - 111 mmol/L 102  105  105   CO2 22 - 32 mmol/L 25     Calcium 8.9 - 10.3 mg/dL 9.2     Total Protein 6.5 - 8.1 g/dL 7.3     Total Bilirubin 0.3 - 1.2 mg/dL 0.8     Alkaline Phos 38 - 126 U/L 84     AST 15 - 41 U/L 28     ALT 0 - 44 U/L 16       Lipid Panel     Component Value Date/Time   CHOL 156 07/03/2019 1105   TRIG 67 07/03/2019 1105   HDL 54 07/03/2019 1105   CHOLHDL 2.9 07/03/2019 1105   CHOLHDL 3.4 12/16/2014 1249   VLDL 13 12/16/2014 1249   LDLCALC 89 07/03/2019 1105    CBC    Component Value Date/Time   WBC 5.4 07/06/2023 1448   RBC 3.88 (L) 07/06/2023 1448   HGB 7.8 (L) 07/06/2023 1448   HGB 10.4 (L) 01/25/2022 1414   HCT 26.5 (L) 07/06/2023 1448   HCT 32.7 (L) 01/25/2022 1414   PLT 357 07/06/2023 1448   PLT 356 01/25/2022 1414   MCV 68.3 (L) 07/06/2023 1448   MCV 76 (L) 01/25/2022 1414   MCH 20.1 (L) 07/06/2023 1448   MCHC 29.4 (L) 07/06/2023 1448    RDW 19.9 (H) 07/06/2023 1448   RDW 15.9 (H) 01/25/2022 1414   LYMPHSABS 2.0 12/16/2014 1249   MONOABS 0.5 12/16/2014 1249   EOSABS 0.3 12/16/2014 1249   BASOSABS 0.1 12/16/2014 1249    Lab Results  Component Value Date   HGBA1C 5.5 12/16/2014    Assessment & Plan:      Gastroesophageal Reflux Disease (GERD) Reports heartburn, possibly related to diet. Currently self-managing with baking soda and water. -Prescribe Omeprazole to be taken in the morning on an empty stomach.  Peripheral Arterial Disease (PAD) Reports bilateral leg and knee swelling, particularly with activity. No pain reported. Currently under the care of Dr. Chestine Spore, a vascular specialist. -Advise patient to contact Dr. Ophelia Charter office regarding increased swelling. -Advised it is imperative to quit smoking to retard progression of condition -Continue statin -Continue to monitor symptoms and follow up with Dr. Chestine Spore as scheduled on 02/13/2024.   Shortness of Breath Reports shortness of breath with activity for the past week. Possible association with GERD symptoms. -Order BNP -If no fluid is found around the heart, symptoms  are likely related to GERD.  Tobacco Use Reports current smoking, though less than previously. Acknowledges need to quit but is not ready for pharmacological assistance. -Continue to encourage smoking cessation at each visit.  Pedal edema Likely related to PAD. Reports improvement with rest. -Advise patient to wear compression stockings and elevate legs. -Consider prescribing diuretic for as-needed use, pending potassium levels.  General Health Maintenance -Confirm receipt of flu and pneumonia vaccines at next visit. -Advise patient to increase potassium intake, particularly if starting diuretic therapy.          Meds ordered this encounter  Medications   omeprazole (PRILOSEC) 40 MG capsule    Sig: Take 1 capsule (40 mg total) by mouth daily.    Dispense:  30 capsule    Refill:   3   amLODipine (NORVASC) 10 MG tablet    Sig: Take 1 tablet (10 mg total) by mouth daily.    Dispense:  90 tablet    Refill:  1   atorvastatin (LIPITOR) 20 MG tablet    Sig: Take 1 tablet (20 mg total) by mouth daily.    Dispense:  90 tablet    Refill:  1   lisinopril-hydrochlorothiazide (ZESTORETIC) 20-25 MG tablet    Sig: Take 1 tablet by mouth daily.    Dispense:  90 tablet    Refill:  1   furosemide (LASIX) 20 MG tablet    Sig: Take 1 tablet (20 mg total) by mouth daily as needed.    Dispense:  30 tablet    Refill:  1    Follow-up: Return in about 6 months (around 07/16/2024) for Chronic medical conditions.       Hoy Register, MD, FAAFP. [facility] [place], [place] [phone]   01/17/2024, 3:21 PM

## 2024-01-17 NOTE — Patient Instructions (Signed)
VISIT SUMMARY:  During today's visit, we discussed your heartburn, leg swelling, and shortness of breath. We also reviewed your current smoking habits and general health maintenance.  YOUR PLAN:  -GASTROESOPHAGEAL REFLUX DISEASE (GERD): GERD is a condition where stomach acid frequently flows back into the tube connecting your mouth and stomach, causing heartburn. You will start taking Omeprazole in the morning on an empty stomach to help manage this condition.  -PERIPHERAL ARTERIAL DISEASE (PAD): PAD is a condition where narrowed arteries reduce blood flow to your limbs, which can cause leg swelling. You should contact Dr. Chestine Spore regarding the increased swelling and continue to monitor your symptoms. Your follow-up with Dr. Chestine Spore is scheduled for 02/13/2024.  -SHORTNESS OF BREATH: We need to determine if your shortness of breath is due to fluid around your heart or related to GERD. Blood tests have been ordered to check for fluid around your heart.  -TOBACCO USE: Smoking can worsen many health conditions. Although you are not ready for medication to help you quit, we will continue to encourage you to stop smoking at each visit.  -LEG SWELLING: Your leg swelling is likely related to PAD and improves with rest. You should wear compression stockings, elevate your legs, and we may consider prescribing a diuretic depending on your potassium levels.  -GENERAL HEALTH MAINTENANCE: We will confirm that you have received your flu and pneumonia vaccines at your next visit. Additionally, you should increase your potassium intake, especially if you start diuretic therapy.  INSTRUCTIONS:  Please follow up with Dr. Chestine Spore regarding your leg swelling and attend your scheduled appointment on 02/13/2024. Additionally, complete the blood tests ordered to check for fluid around your heart.

## 2024-01-18 ENCOUNTER — Other Ambulatory Visit: Payer: Self-pay | Admitting: Family Medicine

## 2024-01-18 DIAGNOSIS — R600 Localized edema: Secondary | ICD-10-CM

## 2024-01-18 DIAGNOSIS — R0609 Other forms of dyspnea: Secondary | ICD-10-CM

## 2024-01-18 LAB — BASIC METABOLIC PANEL
BUN/Creatinine Ratio: 10 (ref 10–24)
BUN: 12 mg/dL (ref 8–27)
CO2: 19 mmol/L — ABNORMAL LOW (ref 20–29)
Calcium: 8.8 mg/dL (ref 8.6–10.2)
Chloride: 104 mmol/L (ref 96–106)
Creatinine, Ser: 1.2 mg/dL (ref 0.76–1.27)
Glucose: 102 mg/dL — ABNORMAL HIGH (ref 70–99)
Potassium: 4 mmol/L (ref 3.5–5.2)
Sodium: 139 mmol/L (ref 134–144)
eGFR: 65 mL/min/{1.73_m2} (ref >59)

## 2024-01-18 LAB — PRO B NATRIURETIC PEPTIDE: NT-Pro BNP: 2597 pg/mL — ABNORMAL HIGH (ref 0–376)

## 2024-02-03 ENCOUNTER — Inpatient Hospital Stay (HOSPITAL_COMMUNITY)
Admission: EM | Admit: 2024-02-03 | Discharge: 2024-02-06 | DRG: 291 | Disposition: A | Discharge status: Home / Self Care | Payer: Medicare (Managed Care) | Attending: Family Medicine | Admitting: Family Medicine

## 2024-02-03 ENCOUNTER — Emergency Department (HOSPITAL_COMMUNITY): Payer: Medicare (Managed Care)

## 2024-02-03 ENCOUNTER — Other Ambulatory Visit: Payer: Self-pay

## 2024-02-03 ENCOUNTER — Encounter (HOSPITAL_COMMUNITY): Payer: Self-pay

## 2024-02-03 DIAGNOSIS — T501X6A Underdosing of loop [high-ceiling] diuretics, initial encounter: Secondary | ICD-10-CM | POA: Diagnosis not present

## 2024-02-03 DIAGNOSIS — E876 Hypokalemia: Secondary | ICD-10-CM | POA: Diagnosis not present

## 2024-02-03 DIAGNOSIS — I5023 Acute on chronic systolic (congestive) heart failure: Secondary | ICD-10-CM

## 2024-02-03 DIAGNOSIS — I493 Ventricular premature depolarization: Secondary | ICD-10-CM | POA: Diagnosis not present

## 2024-02-03 DIAGNOSIS — R600 Localized edema: Secondary | ICD-10-CM | POA: Diagnosis not present

## 2024-02-03 DIAGNOSIS — I4729 Other ventricular tachycardia: Secondary | ICD-10-CM

## 2024-02-03 DIAGNOSIS — Z5919 Other inadequate housing: Secondary | ICD-10-CM | POA: Diagnosis not present

## 2024-02-03 DIAGNOSIS — Z1152 Encounter for screening for COVID-19: Secondary | ICD-10-CM | POA: Diagnosis not present

## 2024-02-03 DIAGNOSIS — K219 Gastro-esophageal reflux disease without esophagitis: Secondary | ICD-10-CM | POA: Diagnosis present

## 2024-02-03 DIAGNOSIS — Z79899 Other long term (current) drug therapy: Secondary | ICD-10-CM

## 2024-02-03 DIAGNOSIS — M25561 Pain in right knee: Secondary | ICD-10-CM | POA: Diagnosis not present

## 2024-02-03 DIAGNOSIS — Z720 Tobacco use: Secondary | ICD-10-CM | POA: Diagnosis present

## 2024-02-03 DIAGNOSIS — I472 Ventricular tachycardia, unspecified: Secondary | ICD-10-CM | POA: Diagnosis present

## 2024-02-03 DIAGNOSIS — I5033 Acute on chronic diastolic (congestive) heart failure: Secondary | ICD-10-CM | POA: Diagnosis not present

## 2024-02-03 DIAGNOSIS — R7989 Other specified abnormal findings of blood chemistry: Secondary | ICD-10-CM

## 2024-02-03 DIAGNOSIS — F17210 Nicotine dependence, cigarettes, uncomplicated: Secondary | ICD-10-CM | POA: Diagnosis present

## 2024-02-03 DIAGNOSIS — D649 Anemia, unspecified: Secondary | ICD-10-CM

## 2024-02-03 DIAGNOSIS — B888 Other specified infestations: Principal | ICD-10-CM

## 2024-02-03 DIAGNOSIS — R0602 Shortness of breath: Secondary | ICD-10-CM | POA: Diagnosis not present

## 2024-02-03 DIAGNOSIS — E785 Hyperlipidemia, unspecified: Secondary | ICD-10-CM | POA: Diagnosis present

## 2024-02-03 DIAGNOSIS — M25562 Pain in left knee: Secondary | ICD-10-CM | POA: Diagnosis present

## 2024-02-03 DIAGNOSIS — I440 Atrioventricular block, first degree: Secondary | ICD-10-CM | POA: Diagnosis not present

## 2024-02-03 DIAGNOSIS — N183 Chronic kidney disease, stage 3 unspecified: Secondary | ICD-10-CM | POA: Insufficient documentation

## 2024-02-03 DIAGNOSIS — I2489 Other forms of acute ischemic heart disease: Secondary | ICD-10-CM | POA: Diagnosis present

## 2024-02-03 DIAGNOSIS — I70219 Atherosclerosis of native arteries of extremities with intermittent claudication, unspecified extremity: Secondary | ICD-10-CM | POA: Diagnosis present

## 2024-02-03 DIAGNOSIS — I130 Hypertensive heart and chronic kidney disease with heart failure and stage 1 through stage 4 chronic kidney disease, or unspecified chronic kidney disease: Secondary | ICD-10-CM | POA: Diagnosis not present

## 2024-02-03 DIAGNOSIS — N1831 Chronic kidney disease, stage 3a: Secondary | ICD-10-CM | POA: Diagnosis present

## 2024-02-03 DIAGNOSIS — D509 Iron deficiency anemia, unspecified: Secondary | ICD-10-CM | POA: Insufficient documentation

## 2024-02-03 DIAGNOSIS — Z91148 Patient's other noncompliance with medication regimen for other reason: Secondary | ICD-10-CM

## 2024-02-03 DIAGNOSIS — I5A Non-ischemic myocardial injury (non-traumatic): Secondary | ICD-10-CM | POA: Diagnosis present

## 2024-02-03 DIAGNOSIS — I10 Essential (primary) hypertension: Secondary | ICD-10-CM | POA: Diagnosis present

## 2024-02-03 DIAGNOSIS — Z8249 Family history of ischemic heart disease and other diseases of the circulatory system: Secondary | ICD-10-CM | POA: Diagnosis not present

## 2024-02-03 DIAGNOSIS — I509 Heart failure, unspecified: Secondary | ICD-10-CM

## 2024-02-03 LAB — COMPREHENSIVE METABOLIC PANEL
ALT: 18 U/L (ref 0–44)
AST: 29 U/L (ref 15–41)
Albumin: 3.3 g/dL — ABNORMAL LOW (ref 3.5–5.0)
Alkaline Phosphatase: 79 U/L (ref 38–126)
Anion gap: 15 (ref 5–15)
BUN: 21 mg/dL (ref 8–23)
CO2: 16 mmol/L — ABNORMAL LOW (ref 22–32)
Calcium: 8.4 mg/dL — ABNORMAL LOW (ref 8.9–10.3)
Chloride: 104 mmol/L (ref 98–111)
Creatinine, Ser: 1.32 mg/dL — ABNORMAL HIGH (ref 0.61–1.24)
GFR, Estimated: 58 mL/min — ABNORMAL LOW (ref >60)
Glucose, Bld: 95 mg/dL (ref 70–99)
Potassium: 2.9 mmol/L — ABNORMAL LOW (ref 3.5–5.1)
Sodium: 135 mmol/L (ref 135–145)
Total Bilirubin: 1 mg/dL (ref 0.0–1.2)
Total Protein: 6.8 g/dL (ref 6.5–8.1)

## 2024-02-03 LAB — RETICULOCYTES
Immature Retic Fract: 30.7 % — ABNORMAL HIGH (ref 2.3–15.9)
RBC.: 3.46 MIL/uL — ABNORMAL LOW (ref 4.22–5.81)
Retic Count, Absolute: 66.8 10*3/uL (ref 19.0–186.0)
Retic Ct Pct: 1.9 % (ref 0.4–3.1)

## 2024-02-03 LAB — URINALYSIS, ROUTINE W REFLEX MICROSCOPIC
Bilirubin Urine: NEGATIVE
Glucose, UA: NEGATIVE mg/dL
Hgb urine dipstick: NEGATIVE
Ketones, ur: NEGATIVE mg/dL
Leukocytes,Ua: NEGATIVE
Nitrite: NEGATIVE
Protein, ur: NEGATIVE mg/dL
Specific Gravity, Urine: 1.008 (ref 1.005–1.030)
pH: 6 (ref 5.0–8.0)

## 2024-02-03 LAB — PREPARE RBC (CROSSMATCH)

## 2024-02-03 LAB — CBC WITH DIFFERENTIAL/PLATELET
Abs Immature Granulocytes: 0.04 10*3/uL (ref 0.00–0.07)
Basophils Absolute: 0.1 10*3/uL (ref 0.0–0.1)
Basophils Relative: 1 %
Eosinophils Absolute: 0 10*3/uL (ref 0.0–0.5)
Eosinophils Relative: 1 %
HCT: 21 % — ABNORMAL LOW (ref 39.0–52.0)
Hemoglobin: 5.6 g/dL — CL (ref 13.0–17.0)
Immature Granulocytes: 1 %
Lymphocytes Relative: 15 %
Lymphs Abs: 0.8 10*3/uL (ref 0.7–4.0)
MCH: 17.2 pg — ABNORMAL LOW (ref 26.0–34.0)
MCHC: 26.7 g/dL — ABNORMAL LOW (ref 30.0–36.0)
MCV: 64.6 fL — ABNORMAL LOW (ref 80.0–100.0)
Monocytes Absolute: 0.6 10*3/uL (ref 0.1–1.0)
Monocytes Relative: 11 %
Neutro Abs: 4 10*3/uL (ref 1.7–7.7)
Neutrophils Relative %: 71 %
Platelets: 151 10*3/uL (ref 150–400)
RBC: 3.25 MIL/uL — ABNORMAL LOW (ref 4.22–5.81)
RDW: 23.1 % — ABNORMAL HIGH (ref 11.5–15.5)
WBC: 5.6 10*3/uL (ref 4.0–10.5)
nRBC: 0.7 % — ABNORMAL HIGH (ref 0.0–0.2)

## 2024-02-03 LAB — IRON AND TIBC
Iron: 16 ug/dL — ABNORMAL LOW (ref 45–182)
Saturation Ratios: 4 % — ABNORMAL LOW (ref 17.9–39.5)
TIBC: 410 ug/dL (ref 250–450)
UIBC: 394 ug/dL

## 2024-02-03 LAB — TROPONIN I (HIGH SENSITIVITY)
Troponin I (High Sensitivity): 65 ng/L — ABNORMAL HIGH (ref <18)
Troponin I (High Sensitivity): 123 ng/L (ref <18)
Troponin I (High Sensitivity): 339 ng/L (ref <18)
Troponin I (High Sensitivity): 711 ng/L (ref <18)

## 2024-02-03 LAB — LIPID PANEL
Cholesterol: 85 mg/dL (ref 0–200)
HDL: 43 mg/dL (ref >40)
LDL Cholesterol: 32 mg/dL (ref 0–99)
Total CHOL/HDL Ratio: 2 {ratio}
Triglycerides: 50 mg/dL (ref <150)
VLDL: 10 mg/dL (ref 0–40)

## 2024-02-03 LAB — MAGNESIUM: Magnesium: 2.2 mg/dL (ref 1.7–2.4)

## 2024-02-03 LAB — HEMOGLOBIN AND HEMATOCRIT, BLOOD
HCT: 23.9 % — ABNORMAL LOW (ref 39.0–52.0)
Hemoglobin: 6.7 g/dL — CL (ref 13.0–17.0)

## 2024-02-03 LAB — RESP PANEL BY RT-PCR (RSV, FLU A&B, COVID)  RVPGX2
Influenza A by PCR: NEGATIVE
Influenza B by PCR: NEGATIVE
Resp Syncytial Virus by PCR: NEGATIVE
SARS Coronavirus 2 by RT PCR: NEGATIVE

## 2024-02-03 LAB — BRAIN NATRIURETIC PEPTIDE: B Natriuretic Peptide: 1535 pg/mL — ABNORMAL HIGH (ref 0.0–100.0)

## 2024-02-03 LAB — TSH: TSH: 2.062 u[IU]/mL (ref 0.350–4.500)

## 2024-02-03 LAB — PHOSPHORUS: Phosphorus: 3.9 mg/dL (ref 2.5–4.6)

## 2024-02-03 LAB — FOLATE: Folate: 14 ng/mL (ref >5.9)

## 2024-02-03 LAB — POC OCCULT BLOOD, ED: Fecal Occult Bld: NEGATIVE

## 2024-02-03 LAB — PROTIME-INR
INR: 1.4 — ABNORMAL HIGH (ref 0.8–1.2)
Prothrombin Time: 16.9 s — ABNORMAL HIGH (ref 11.4–15.2)

## 2024-02-03 LAB — VITAMIN B12: Vitamin B-12: 324 pg/mL (ref 180–914)

## 2024-02-03 LAB — FERRITIN: Ferritin: 8 ng/mL — ABNORMAL LOW (ref 24–336)

## 2024-02-03 MED ORDER — ACETAMINOPHEN 650 MG RE SUPP: 650.0000 mg | Freq: Four times a day (QID) | RECTAL | Status: DC | PRN

## 2024-02-03 MED ORDER — SENNOSIDES-DOCUSATE SODIUM 8.6-50 MG PO TABS: 1.0000 | ORAL_TABLET | Freq: Every evening | ORAL | Status: DC | PRN

## 2024-02-03 MED ORDER — ONDANSETRON HCL 4 MG PO TABS: 4.0000 mg | ORAL_TABLET | Freq: Four times a day (QID) | ORAL | Status: DC | PRN

## 2024-02-03 MED ORDER — FUROSEMIDE 10 MG/ML IJ SOLN
20.0000 mg | Freq: Once | INTRAMUSCULAR | Status: AC
  Administered 2024-02-04: 20 mg via INTRAVENOUS
  Filled 2024-02-03: qty 2

## 2024-02-03 MED ORDER — ACETAMINOPHEN 325 MG PO TABS: 650.0000 mg | ORAL_TABLET | Freq: Four times a day (QID) | ORAL | Status: DC | PRN

## 2024-02-03 MED ORDER — FUROSEMIDE 10 MG/ML IJ SOLN
40.0000 mg | Freq: Once | INTRAMUSCULAR | Status: AC
  Administered 2024-02-03: 40 mg via INTRAVENOUS
  Filled 2024-02-03: qty 4

## 2024-02-03 MED ORDER — MAGNESIUM SULFATE 2 GM/50ML IV SOLN
2.0000 g | Freq: Once | INTRAVENOUS | Status: AC
  Administered 2024-02-03: 2 g via INTRAVENOUS
  Filled 2024-02-03: qty 50

## 2024-02-03 MED ORDER — SODIUM CHLORIDE 0.9% IV SOLUTION: Freq: Once | INTRAVENOUS | Status: DC

## 2024-02-03 MED ORDER — ATORVASTATIN CALCIUM 20 MG PO TABS
20.0000 mg | ORAL_TABLET | Freq: Every day | ORAL | Status: DC
  Administered 2024-02-03 – 2024-02-06 (×4): 20 mg via ORAL
  Filled 2024-02-03 (×4): qty 1

## 2024-02-03 MED ORDER — PANTOPRAZOLE SODIUM 40 MG PO TBEC
40.0000 mg | DELAYED_RELEASE_TABLET | Freq: Every day | ORAL | Status: DC
  Administered 2024-02-03 – 2024-02-06 (×4): 40 mg via ORAL
  Filled 2024-02-03 (×4): qty 1

## 2024-02-03 MED ORDER — POTASSIUM CHLORIDE 10 MEQ/100ML IV SOLN
10.0000 meq | INTRAVENOUS | Status: AC
  Administered 2024-02-03 (×2): 10 meq via INTRAVENOUS
  Filled 2024-02-03 (×2): qty 100

## 2024-02-03 MED ORDER — SODIUM CHLORIDE 0.9% IV SOLUTION: Freq: Once | INTRAVENOUS | Status: AC

## 2024-02-03 MED ORDER — FUROSEMIDE 10 MG/ML IJ SOLN
40.0000 mg | Freq: Every day | INTRAMUSCULAR | Status: DC
  Administered 2024-02-04 – 2024-02-05 (×2): 40 mg via INTRAVENOUS
  Filled 2024-02-03 (×2): qty 4

## 2024-02-03 MED ORDER — SODIUM CHLORIDE 0.9 % IV SOLN
125.0000 mg | Freq: Once | INTRAVENOUS | Status: AC
  Administered 2024-02-04: 125 mg via INTRAVENOUS
  Filled 2024-02-03 (×2): qty 10

## 2024-02-03 MED ORDER — POTASSIUM CHLORIDE CRYS ER 20 MEQ PO TBCR
40.0000 meq | EXTENDED_RELEASE_TABLET | Freq: Two times a day (BID) | ORAL | Status: AC
  Administered 2024-02-03 (×2): 40 meq via ORAL
  Filled 2024-02-03 (×2): qty 2

## 2024-02-03 MED ORDER — LORATADINE 10 MG PO TABS
10.0000 mg | ORAL_TABLET | Freq: Every day | ORAL | Status: DC
  Administered 2024-02-03 – 2024-02-06 (×4): 10 mg via ORAL
  Filled 2024-02-03 (×4): qty 1

## 2024-02-03 MED ORDER — ENOXAPARIN SODIUM 40 MG/0.4ML IJ SOSY
40.0000 mg | PREFILLED_SYRINGE | INTRAMUSCULAR | Status: DC
  Administered 2024-02-03 – 2024-02-04 (×2): 40 mg via SUBCUTANEOUS
  Filled 2024-02-03 (×3): qty 0.4

## 2024-02-03 MED ORDER — ONDANSETRON HCL 4 MG/2ML IJ SOLN: 4.0000 mg | Freq: Four times a day (QID) | INTRAMUSCULAR | Status: DC | PRN

## 2024-02-03 MED ORDER — AMLODIPINE BESYLATE 5 MG PO TABS
10.0000 mg | ORAL_TABLET | Freq: Every day | ORAL | Status: DC
  Administered 2024-02-03 – 2024-02-06 (×4): 10 mg via ORAL
  Filled 2024-02-03 (×4): qty 2

## 2024-02-03 NOTE — ED Triage Notes (Signed)
 Pt reports both knees swelling with intermittent pain for a week. C/o sob and both legs being "tight" when he wakes up.

## 2024-02-03 NOTE — ED Provider Triage Note (Signed)
 Emergency Medicine Provider Triage Evaluation Note  Martin Mason , a 68 y.o. male  was evaluated in triage.  Pt complains of bilateral knee pain after starting his omeprazole  about 4 days ago.  Denies any injuries to his knees.  Denies any fever chills, or pain or swelling in the calf.  Also reports some shortness of breath ongoing for past 4 days.  Denies any chest pain.  Review of Systems  Positive: As above Negative: As above  Physical Exam  There were no vitals taken for this visit. Gen:   Awake, no distress   Resp:  Normal effort  MSK:   Moves extremities without difficulty    Medical Decision Making  Medically screening exam initiated at 11:09 AM.  Appropriate orders placed.  Erric Machnik was informed that the remainder of the evaluation will be completed by another provider, this initial triage assessment does not replace that evaluation, and the importance of remaining in the ED until their evaluation is complete.     Veta Palma, PA-C 02/03/24 1119

## 2024-02-03 NOTE — ED Provider Notes (Signed)
 [place] EMERGENCY DEPARTMENT AT [facility] Provider Note   CSN: [id number] Arrival date & time: 02/03/24  1045     History  Chief Complaint  Patient presents with   Knee Pain    Martin Mason is a 68 y.o. male.  Pt is a 68 yo male with pmhx significant for htn, asthma, chronic anemia, hypokalemia, and hld.  Pt presents to the ED today with leg pain and swelling.  He has no cp, but he has had sob.  Pt denies any trauma.  He stopped taking his lasix  because he thought that was making his legs swell.  No black stools.         Home Medications Prior to Admission medications   Medication Sig Start Date End Date Taking? Authorizing Provider  amLODipine  (NORVASC ) 10 MG tablet Take 1 tablet (10 mg total) by mouth daily. 01/17/24   Newlin, Enobong, MD  atorvastatin  (LIPITOR) 20 MG tablet Take 1 tablet (20 mg total) by mouth daily. 01/17/24   Newlin, Enobong, MD  EQ ALLERGY RELIEF, CETIRIZINE , 10 MG tablet Take 1 tablet by mouth once daily 12/18/23   Newlin, Enobong, MD  ferrous sulfate  325 (65 FE) MG tablet Take 1 tablet (325 mg total) by mouth daily. 07/06/23   Yolande Lamar BROCKS, MD  fluticasone  (FLONASE ) 50 MCG/ACT nasal spray Use 2 spray(s) in each nostril once daily 12/18/23   Newlin, Enobong, MD  furosemide  (LASIX ) 20 MG tablet Take 1 tablet (20 mg total) by mouth daily as needed. 01/17/24   Newlin, Enobong, MD  lisinopril -hydrochlorothiazide  (ZESTORETIC ) 20-25 MG tablet Take 1 tablet by mouth daily. 01/17/24   Newlin, Enobong, MD  omeprazole  (PRILOSEC) 40 MG capsule Take 1 capsule (40 mg total) by mouth daily. 01/17/24   Newlin, Enobong, MD  polyethylene glycol (MIRALAX ) 17 g packet Take 17 g by mouth daily. 07/06/23   Yolande Lamar BROCKS, MD  potassium chloride  SA (KLOR-CON  M) 20 MEQ tablet Take 1 tablet (20 mEq total) by mouth 2 (two) times daily for 3 days. 07/06/23 07/09/23  Yolande Lamar BROCKS, MD  sildenafil  (VIAGRA ) 50 MG tablet TAKE 1 TABLET BY MOUTH ONCE DAILY AS NEEDED  FOR ERECTILE DYSFUNCTION AT  LEAST  24  HOURS  BETWEEN  DOSES 08/17/23   Newlin, Enobong, MD      Allergies    Patient has no known allergies.    Review of Systems   Review of Systems  Respiratory:  Positive for shortness of breath.   Cardiovascular:  Positive for leg swelling.  Musculoskeletal:        B knee pain  All other systems reviewed and are negative.   Physical Exam Updated Vital Signs BP 130/74 (BP Location: Right Arm)   Pulse 88   Temp (!) 97.5 F (36.4 C) (Oral)   Resp 16   Ht 5' 8 (1.727 m)   Wt 64.4 kg   SpO2 92%   BMI 21.59 kg/m  Physical Exam Vitals and nursing note reviewed.  Constitutional:      Appearance: Normal appearance.     Comments: Bed bugs noted on clothes  HENT:     Head: Normocephalic and atraumatic.     Right Ear: External ear normal.     Left Ear: External ear normal.     Nose: Nose normal.     Mouth/Throat:     Mouth: Mucous membranes are moist.     Pharynx: Oropharynx is clear.  Eyes:     Extraocular Movements: Extraocular movements  intact.     Conjunctiva/sclera: Conjunctivae normal.     Pupils: Pupils are equal, round, and reactive to light.  Cardiovascular:     Rate and Rhythm: Normal rate. Rhythm irregular.     Pulses: Normal pulses.     Heart sounds: Normal heart sounds.     Comments: Short runs of vt on monitor Pulmonary:     Effort: Pulmonary effort is normal.     Breath sounds: Rhonchi present.  Abdominal:     General: Abdomen is flat. Bowel sounds are normal.     Palpations: Abdomen is soft.  Genitourinary:    Rectum: Guaiac result negative.     Comments: Swelling noted to penis and scrotum (no redness) Musculoskeletal:        General: Normal range of motion.     Cervical back: Normal range of motion and neck supple.     Right lower leg: Edema present.     Left lower leg: Edema present.  Skin:    General: Skin is warm.     Capillary Refill: Capillary refill takes less than 2 seconds.  Neurological:      General: No focal deficit present.     Mental Status: He is alert and oriented to person, place, and time.  Psychiatric:        Mood and Affect: Mood normal.        Behavior: Behavior normal.     ED Results / Procedures / Treatments   Labs (all labs ordered are listed, but only abnormal results are displayed) Labs Reviewed  CBC WITH DIFFERENTIAL/PLATELET - Abnormal; Notable for the following components:      Result Value   RBC 3.25 (*)    Hemoglobin 5.6 (*)    HCT 21.0 (*)    MCV 64.6 (*)    MCH 17.2 (*)    MCHC 26.7 (*)    RDW 23.1 (*)    nRBC 0.7 (*)    All other components within normal limits  COMPREHENSIVE METABOLIC PANEL - Abnormal; Notable for the following components:   Potassium 2.9 (*)    CO2 16 (*)    Creatinine, Ser 1.32 (*)    Calcium  8.4 (*)    Albumin  3.3 (*)    GFR, Estimated 58 (*)    All other components within normal limits  BRAIN NATRIURETIC PEPTIDE - Abnormal; Notable for the following components:   B Natriuretic Peptide 1,535.0 (*)    All other components within normal limits  PROTIME-INR - Abnormal; Notable for the following components:   Prothrombin Time 16.9 (*)    INR 1.4 (*)    All other components within normal limits  TROPONIN I (HIGH SENSITIVITY) - Abnormal; Notable for the following components:   Troponin I (High Sensitivity) 65 (*)    All other components within normal limits  TROPONIN I (HIGH SENSITIVITY) - Abnormal; Notable for the following components:   Troponin I (High Sensitivity) 123 (*)    All other components within normal limits  RESP PANEL BY RT-PCR (RSV, FLU A&B, COVID)  RVPGX2  MAGNESIUM   URINALYSIS, ROUTINE W REFLEX MICROSCOPIC  VITAMIN B12  FOLATE  IRON  AND TIBC  FERRITIN  RETICULOCYTES  PHOSPHORUS  TSH  LIPID PANEL  POC OCCULT BLOOD, ED  TYPE AND SCREEN  PREPARE RBC (CROSSMATCH)    EKG EKG Interpretation Date/Time:  Saturday February 03 2024 11:21:43 EST Ventricular Rate:  80 PR Interval:  226 QRS  Duration:  110 QT Interval:  427 QTC Calculation: 477 R  Axis:   -35  Text Interpretation: Sinus rhythm Multiple ventricular premature complexes Prolonged PR interval Left axis deviation Probable anterior infarct, age indeterminate Lateral leads are also involved No significant change since last tracing Confirmed by Dean Clarity [phone]) on 02/03/2024 12:45:21 PM  Radiology DG Chest Portable 1 View Result Date: 02/03/2024 CLINICAL DATA:  Shortness of breath. EXAM: PORTABLE CHEST 1 VIEW COMPARISON:  10/30/2015 FINDINGS: The cardio pericardial silhouette is enlarged. The lungs are clear without focal pneumonia, edema, pneumothorax or pleural effusion. Interstitial markings are diffusely coarsened with chronic features. Bones are diffusely demineralized. Telemetry leads overlie the chest. IMPRESSION: Chronic interstitial coarsening without acute cardiopulmonary findings. Electronically Signed   By: Camellia Candle M.D.   On: 02/03/2024 14:04   DG Knee Complete 4 Views Right Result Date: 02/03/2024 CLINICAL DATA:  Swelling and intermittent knee pain, no stated injury EXAM: RIGHT KNEE - COMPLETE 4+ VIEW; LEFT KNEE - COMPLETE 4+ VIEW COMPARISON:  None Available. FINDINGS: No evidence of fracture, dislocation, or joint effusion. No evidence of arthropathy or other focal bone abnormality. Vascular calcinosis. IMPRESSION: 1. No fracture or dislocation of the bilateral knees. Joint spaces are preserved. 2. Vascular calcinosis. Electronically Signed   By: Marolyn JONETTA Jaksch M.D.   On: 02/03/2024 14:03   DG Knee Complete 4 Views Left Result Date: 02/03/2024 CLINICAL DATA:  Swelling and intermittent knee pain, no stated injury EXAM: RIGHT KNEE - COMPLETE 4+ VIEW; LEFT KNEE - COMPLETE 4+ VIEW COMPARISON:  None Available. FINDINGS: No evidence of fracture, dislocation, or joint effusion. No evidence of arthropathy or other focal bone abnormality. Vascular calcinosis. IMPRESSION: 1. No fracture or dislocation of the bilateral  knees. Joint spaces are preserved. 2. Vascular calcinosis. Electronically Signed   By: Marolyn JONETTA Jaksch M.D.   On: 02/03/2024 14:03    Procedures Procedures    Medications Ordered in ED Medications  0.9 %  sodium chloride  infusion (Manually program via Guardrails IV Fluids) (has no administration in time range)  potassium chloride  10 mEq in 100 mL IVPB (0 mEq Intravenous Stopped 02/03/24 1527)  furosemide  (LASIX ) injection 40 mg (40 mg Intravenous Given 02/03/24 1422)  magnesium  sulfate IVPB 2 g 50 mL (0 g Intravenous Stopped 02/03/24 1527)    ED Course/ Medical Decision Making/ A&P                                 Medical Decision Making Amount and/or Complexity of Data Reviewed Labs: ordered.  Risk Prescription drug management. Decision regarding hospitalization.   This patient presents to the ED for concern of swelling in legs, this involves an extensive number of treatment options, and is a complaint that carries with it a high risk of complications and morbidity.  The differential diagnosis includes peripheral edema, chf, electrolyte abn, anemia   Co morbidities that complicate the patient evaluation  htn, asthma, chronic anemia, hypokalemia, and hld   Additional history obtained:  Additional history obtained from epic chart review External records from outside source obtained and reviewed including friend   Lab Tests:  I Ordered, and personally interpreted labs.  The pertinent results include:  covid/flu/rsv neg, cbc with hgb low at 5.6 (7.8 in July); cmp with k low at 2.9 (4.0 on 1/22) and cr 1.32 (1.2 on 1/22); 1st trop elevated at 65; 2nd troponin elevated at 123   Imaging Studies ordered:  I ordered imaging studies including cxr, knee xrays I independently visualized and interpreted  imaging which showed  CXR: Chronic interstitial coarsening without acute cardiopulmonary  findings.  R and L knee: No fracture or dislocation of the bilateral knees. Joint spaces   are preserved.  2. Vascular calcinosis.    I agree with the radiologist interpretation   Cardiac Monitoring:  The patient was maintained on a cardiac monitor.  I personally viewed and interpreted the cardiac monitored which showed an underlying rhythm of: nsr with short runs of VT   Medicines ordered and prescription drug management:  I ordered medication including lasix   for sx  Reevaluation of the patient after these medicines showed that the patient improved I have reviewed the patients home medicines and have made adjustments as needed   Critical Interventions:  transfusion   Consultations Obtained:  I requested consultation with the hospitalist (Dr. Lou),  and discussed lab and imaging findings as well as pertinent plan - he will admit  Problem List / ED Course:  VT:  likely due to hypokalemia/hypomagnesemia or underlying heart disease.   Hypokalemia:  iv and oral k given and iv mg given Anemia:  worse than his usual.  He has not had any work up for his anemia.  1 unit prbc ordered for transfusion.  Anemia panel ordered. Peripheral edema:  iv lasix  given Elevated troponin:  no cp, but trop is increasing.  Likely due to strain.  I will not give heparin as anemia is worsening and etiology is unclear.     Reevaluation:  After the interventions noted above, I reevaluated the patient and found that they have :improved   Social Determinants of Health:  Lives at home   Dispostion:  After consideration of the diagnostic results and the patients response to treatment, I feel that the patent would benefit from admission.  CRITICAL CARE Performed by: Mliss Boyers   Total critical care time: 30 minutes  Critical care time was exclusive of separately billable procedures and treating other patients.  Critical care was necessary to treat or prevent imminent or life-threatening deterioration.  Critical care was time spent personally by me on the following  activities: development of treatment plan with patient and/or surrogate as well as nursing, discussions with consultants, evaluation of patient's response to treatment, examination of patient, obtaining history from patient or surrogate, ordering and performing treatments and interventions, ordering and review of laboratory studies, ordering and review of radiographic studies, pulse oximetry and re-evaluation of patient's condition.           Final Clinical Impression(s) / ED Diagnoses Final diagnoses:  Infestation by bed bug  Symptomatic anemia  Hypokalemia  Peripheral edema  Nonsustained ventricular tachycardia (HCC)  Elevated troponin    Rx / DC Orders ED Discharge Orders     None         Boyers Mliss, MD 02/03/24 [phone]

## 2024-02-03 NOTE — H&P (Addendum)
 History and Physical  Zylan Almquist FMW:[id number] DOB: [birth year] DOA: 02/03/2024  PCP: Delbert Clam, MD   Chief Complaint: SOB, leg swelling  HPI: Martin Bevens is a 68 y.o. male with medical history significant for HTN, HLD, PAD, IDA, hypokalemia, chronic leg swelling, tobacco use disorder and GERD who presents to the ED for evaluation of shortness of breath and leg swelling.  Patient reports that over the last week, he has had worsening swelling in his knees and legs.  He reports associated shortness of breath and dyspnea on exertion. He also reports lower extremity claudication described as calf tightness but denies any rest pain. He was previously able to walk a mile before he started having back pain but now the leg pain starts after walking half a mile.  He denies any orthopnea, PND, melena, hematochezia, hematuria, fatigue, dysuria, foot wounds, abdominal pain, nausea or vomiting. He denies any dietary indiscretion but reports she has not been taking his Lasix  as prescribed. States he has cut down his tobacco use to 3 cigarettes a day.  Patient also reports that he had a recent bedbug infestation that was treated.  ED Course: Initial vitals stable.  Labs significant for Hgb of 5.6 (from 7.8 in July 2024), K+ 2.9, creatinine 1.32, BNP 1535, troponin 65->123, mag 2.2, PT/INR 16.9/1.4.  EKG shows sinus rhythm with prolonged PR interval and multiple PVCs. Chest x-ray shows chronic interstitial coarsening with no acute findings. Patient received IV Lasix  40 mg x 1, IV mag 2 g and IV KCl 10 mEq x 2. 1 unit PRBC was ordered. Per RN, a bedbug was found on pt during x-ray in the ED [facility] was consulted for admission  Review of Systems: Please see HPI for pertinent positives and negatives. A complete 10 system review of systems are otherwise negative.  Past Medical History:  Diagnosis Date   Asthma    childhood- young child to age 49   Hypertension    Past Surgical History:  Procedure Laterality  Date   COLONOSCOPY     CYST EXCISION     throat   TOOTH EXTRACTION N/A 11/06/2020   Procedure: DENTAL RESTORATION/EXTRACTIONS;  Surgeon: Sheryle Hamilton, DDS;  Location: [facility] OR;  Service: Oral Surgery;  Laterality: N/A;   Social History:  reports that he has been smoking cigarettes. He has never used smokeless tobacco. He reports current drug use. Drug: Marijuana. He reports that he does not drink alcohol.  No Known Allergies  Family History  Problem Relation Age of Onset   Hypertension Mother    Cancer Mother    Hypertension Father    Diabetes Sister      Prior to Admission medications   Medication Sig Start Date End Date Taking? Authorizing Provider  amLODipine  (NORVASC ) 10 MG tablet Take 1 tablet (10 mg total) by mouth daily. 01/17/24   Newlin, Enobong, MD  atorvastatin  (LIPITOR) 20 MG tablet Take 1 tablet (20 mg total) by mouth daily. 01/17/24   Newlin, Enobong, MD  EQ ALLERGY RELIEF, CETIRIZINE , 10 MG tablet Take 1 tablet by mouth once daily 12/18/23   Newlin, Enobong, MD  ferrous sulfate  325 (65 FE) MG tablet Take 1 tablet (325 mg total) by mouth daily. 07/06/23   Yolande Lamar BROCKS, MD  fluticasone  (FLONASE ) 50 MCG/ACT nasal spray Use 2 spray(s) in each nostril once daily 12/18/23   Newlin, Enobong, MD  furosemide  (LASIX ) 20 MG tablet Take 1 tablet (20 mg total) by mouth daily as needed. 01/17/24   Newlin, Enobong, MD  lisinopril -hydrochlorothiazide  (ZESTORETIC ) 20-25 MG tablet Take 1 tablet by mouth daily. 01/17/24   Newlin, Enobong, MD  omeprazole  (PRILOSEC) 40 MG capsule Take 1 capsule (40 mg total) by mouth daily. 01/17/24   Newlin, Enobong, MD  polyethylene glycol (MIRALAX ) 17 g packet Take 17 g by mouth daily. 07/06/23   Yolande Lamar BROCKS, MD  potassium chloride  SA (KLOR-CON  M) 20 MEQ tablet Take 1 tablet (20 mEq total) by mouth 2 (two) times daily for 3 days. 07/06/23 07/09/23  Yolande Lamar BROCKS, MD  sildenafil  (VIAGRA ) 50 MG tablet TAKE 1 TABLET BY MOUTH ONCE DAILY AS NEEDED FOR  ERECTILE DYSFUNCTION AT  LEAST  24  HOURS  BETWEEN  DOSES 08/17/23   Delbert Clam, MD    Physical Exam: BP 130/74 (BP Location: Right Arm)   Pulse 88   Temp (!) 97.5 F (36.4 C) (Oral)   Resp 16   Ht 5' 8 (1.727 m)   Wt 64.4 kg   SpO2 92%   BMI 21.59 kg/m  General: Pleasant, well-appearing elderly man laying in bed. No acute distress. HEENT: Fowler/AT. Anicteric sclera Neck: Supple. JVD to the mid neck. CV: Regular rate. Irregular rhythm. No murmurs, rubs, or gallops. 2+ BLE pitting edema up to knee. Pulmonary: Lungs CTAB. Normal effort. No wheezing. Distant rales at the bases. Abdominal: Soft, nontender, nondistended. Normal bowel sounds. Extremities: Feet cool to touch. Nonpalpable pulses. Faint right DP pulse with doppler, nondopplerable left DP and PT pulse GU: Mild penile and scrotal edema. Skin: Warm and dry. Mild hyperpigmentation of the toes Neuro: A&Ox3. Moves all extremities. Normal sensation to light touch. No focal deficit. Psych: Normal mood and affect          Labs on Admission:  Basic Metabolic Panel: Recent Labs  Lab 02/03/24 1147 02/03/24 1408  NA 135  --   K 2.9*  --   CL 104  --   CO2 16*  --   GLUCOSE 95  --   BUN 21  --   CREATININE 1.32*  --   CALCIUM  8.4*  --   MG  --  2.2   Liver Function Tests: Recent Labs  Lab 02/03/24 1147  AST 29  ALT 18  ALKPHOS 79  BILITOT 1.0  PROT 6.8  ALBUMIN  3.3*   No results for input(s): LIPASE, AMYLASE in the last 168 hours. No results for input(s): AMMONIA in the last 168 hours. CBC: Recent Labs  Lab 02/03/24 1147  WBC 5.6  NEUTROABS 4.0  HGB 5.6*  HCT 21.0*  MCV 64.6*  PLT 151   Cardiac Enzymes: No results for input(s): CKTOTAL, CKMB, CKMBINDEX, TROPONINI in the last 168 hours. BNP (last 3 results) Recent Labs    02/03/24 1147  BNP 1,535.0*    ProBNP (last 3 results) Recent Labs    01/17/24 1447  PROBNP 2,597*    CBG: No results for input(s): GLUCAP in the last  168 hours.  Radiological Exams on Admission: DG Chest Portable 1 View Result Date: 02/03/2024 CLINICAL DATA:  Shortness of breath. EXAM: PORTABLE CHEST 1 VIEW COMPARISON:  10/30/2015 FINDINGS: The cardio pericardial silhouette is enlarged. The lungs are clear without focal pneumonia, edema, pneumothorax or pleural effusion. Interstitial markings are diffusely coarsened with chronic features. Bones are diffusely demineralized. Telemetry leads overlie the chest. IMPRESSION: Chronic interstitial coarsening without acute cardiopulmonary findings. Electronically Signed   By: Camellia Candle M.D.   On: 02/03/2024 14:04   DG Knee Complete 4 Views Right Result Date: 02/03/2024 CLINICAL  DATA:  Swelling and intermittent knee pain, no stated injury EXAM: RIGHT KNEE - COMPLETE 4+ VIEW; LEFT KNEE - COMPLETE 4+ VIEW COMPARISON:  None Available. FINDINGS: No evidence of fracture, dislocation, or joint effusion. No evidence of arthropathy or other focal bone abnormality. Vascular calcinosis. IMPRESSION: 1. No fracture or dislocation of the bilateral knees. Joint spaces are preserved. 2. Vascular calcinosis. Electronically Signed   By: Marolyn JONETTA Jaksch M.D.   On: 02/03/2024 14:03   DG Knee Complete 4 Views Left Result Date: 02/03/2024 CLINICAL DATA:  Swelling and intermittent knee pain, no stated injury EXAM: RIGHT KNEE - COMPLETE 4+ VIEW; LEFT KNEE - COMPLETE 4+ VIEW COMPARISON:  None Available. FINDINGS: No evidence of fracture, dislocation, or joint effusion. No evidence of arthropathy or other focal bone abnormality. Vascular calcinosis. IMPRESSION: 1. No fracture or dislocation of the bilateral knees. Joint spaces are preserved. 2. Vascular calcinosis. Electronically Signed   By: Marolyn JONETTA Jaksch M.D.   On: 02/03/2024 14:03   Assessment/Plan Martin Mason is a 68 y.o. male with medical history significant for HTN, HLD, PAD, IDA, hypokalemia, chronic leg swelling, tobacco use disorder and GERD who presents to the ED for  evaluation of shortness of breath and leg swelling and admitted for CHF exacerbation.  # Acute exacerbation of congestive heart failure # Acute on chronic BLE edema Patient with a history of chronic lower extremity edema now presenting with progressive leg swelling with associated shortness of breath and dyspnea on exertion.  BNP elevated to 1500. CXR shows chronic interstitial coarsening and no acute findings. Patient fluid overloaded on exam but in no respiratory distress with appropriate SpO2 on room air. Presentation likely in the setting of anemia and medication noncompliance. S/p IV Lasix  40 mg x 1 in the ED. -Continue IV Lasix  40 mg daily -Check echocardiogram -Supplemental O2 as needed -Strict I&O, daily weight -Trend and replete electrolytes  # Symptomatic anemia # Iron  deficiency anemia Patient with history of chronic anemia with recent baseline Hgb around 8-9 presenting with shortness of breath found to have >2 point drop in hemoglobin from 7.8 in July 2024 to 5.6. He denies any hematochezia, hemoptysis, melena, or hematuria. Hemoccult negative in the ED. Anemia likely secondary to iron  deficiency. No colonoscopy on file. Will require further evaluation with GI in the outpatient. -Continue transfusing of 1 unit PRBC -Follow-up posttransfusion H&H -Follow-up iron  studies, ferritin, vitamin B12 -Will need referral to GI at discharge for outpatient colonoscopy  2340 addendum:  Post H&H shows Hgb still low at 6.7. Iron  studies shows iron  16, TIBC 410, iron  sat 4%, ferritin 8.  Folate and vitamin B12 within normal limits. -Give additional 1 unit PRBC -Give IV Lasix  20 mg before this 2nd blood transfusion -IV Ferrlecit  125 mg x 1, consider repeating during hospitalization  # PAD with claudication Patient with significant peripheral vascular disease presenting with worsening lower extremity claudication reports leg tightness after walking half a mile. No gangrene, rest pain or foot  wounds. Last ABI in 06/2023 showed moderate disease on the right and severe disease on the left. Patient with nonpalpable pulses. He has Doppler signal only on the right DP pulse. -Vascular surgery consulted, appreciate recs -Repeat ABI  # Elevated troponin Troponin elevated from 65-123 on admission. Patient with no chest pain. EKG with no acute ischemic changes. This is likely demand ischemia in the setting of anemia and acute heart failure. -Trend troponins until peaked  # Hypokalemia Patient with history of hypokalemia and found to have K+  down to 2.9.  Normal mag and Phos. S/p IV Kcl 10 mEq x2 doses in the ED. -Give additional oral KCl 40 mEq x 2 doses -Follow-up morning BMP -Telemetry  # AKI Slight bump in creatinine to 1.32 in the setting of CHF exacerbation. -IV diuresing as above -Avoid nephrotoxic's agents -Trend renal function  # HTN BP stable with SBP in the 110s to 140s -Continue amlodipine  -Holding home zestoretic  today in the setting of AKI  # HLD LDL of 32, currently at goal. -Continue atorvastatin   # GERD -PPI  # Bedbug infestation Reports a recent bedbug infestation at home that was treated. One bedbug patient while in the ED and and another after moving to the floor. -Placed all clothes in sealed bag -Patient can take a hot shower -Replace all bedding in room -Contact precautions  # Tobacco use disorder Patient reports he continues to smoke but has cut down from half a pack of cigarette a day to 3 cigarettes/day. Smoking cessation counseling for 4 minutes today, patient does not want a nicotine  patch  I have discussed tobacco cessation with the patient.  I have counseled the patient regarding the negative impacts of continued tobacco use including but not limited to lung cancer, COPD, cardiovascular disease, and worsening of his PAD.  I have discussed alternatives to tobacco and modalities that may help facilitate tobacco cessation including but not limited  to biofeedback, hypnosis, and medications.  Reports he will attempt to quit on his own. Total time spent with tobacco counseling was 4 minutes.  DVT prophylaxis: Lovenox      Code Status: Full Code  Consults called: Vascular surgery  Family Communication: No family at bedside  Severity of Illness: The appropriate patient status for this patient is INPATIENT. Inpatient status is judged to be reasonable and necessary in order to provide the required intensity of service to ensure the patient's safety. The patient's presenting symptoms, physical exam findings, and initial radiographic and laboratory data in the context of their chronic comorbidities is felt to place them at high risk for further clinical deterioration. Furthermore, it is not anticipated that the patient will be medically stable for discharge from the hospital within 2 midnights of admission.   * I certify that at the point of admission it is my clinical judgment that the patient will require inpatient hospital care spanning beyond 2 midnights from the point of admission due to high intensity of service, high risk for further deterioration and high frequency of surveillance required.*  Level of care:  Medical telemetry  Lou Claretta HERO, MD 02/03/2024, 3:27 PM [facility] Pager: [phone] Isaiah 41:10   If 7PM-7AM, please contact night-coverage [email or web address] Password [email or web address]

## 2024-02-04 ENCOUNTER — Encounter (HOSPITAL_COMMUNITY): Payer: Medicare (Managed Care)

## 2024-02-04 ENCOUNTER — Inpatient Hospital Stay (HOSPITAL_COMMUNITY): Payer: Medicare (Managed Care)

## 2024-02-04 DIAGNOSIS — R0602 Shortness of breath: Secondary | ICD-10-CM | POA: Diagnosis not present

## 2024-02-04 DIAGNOSIS — I5A Non-ischemic myocardial injury (non-traumatic): Secondary | ICD-10-CM | POA: Insufficient documentation

## 2024-02-04 DIAGNOSIS — B888 Other specified infestations: Principal | ICD-10-CM

## 2024-02-04 DIAGNOSIS — I5033 Acute on chronic diastolic (congestive) heart failure: Secondary | ICD-10-CM | POA: Diagnosis not present

## 2024-02-04 DIAGNOSIS — E876 Hypokalemia: Secondary | ICD-10-CM

## 2024-02-04 DIAGNOSIS — N183 Chronic kidney disease, stage 3 unspecified: Secondary | ICD-10-CM | POA: Insufficient documentation

## 2024-02-04 DIAGNOSIS — D509 Iron deficiency anemia, unspecified: Secondary | ICD-10-CM | POA: Insufficient documentation

## 2024-02-04 LAB — BASIC METABOLIC PANEL
Anion gap: 13 (ref 5–15)
BUN: 19 mg/dL (ref 8–23)
CO2: 19 mmol/L — ABNORMAL LOW (ref 22–32)
Calcium: 8.7 mg/dL — ABNORMAL LOW (ref 8.9–10.3)
Chloride: 102 mmol/L (ref 98–111)
Creatinine, Ser: 1.19 mg/dL (ref 0.61–1.24)
GFR, Estimated: >60 mL/min (ref >60)
Glucose, Bld: 78 mg/dL (ref 70–99)
Potassium: 3.1 mmol/L — ABNORMAL LOW (ref 3.5–5.1)
Sodium: 134 mmol/L — ABNORMAL LOW (ref 135–145)

## 2024-02-04 LAB — VITAMIN D 25 HYDROXY (VIT D DEFICIENCY, FRACTURES): Vit D, 25-Hydroxy: 9.64 ng/mL — ABNORMAL LOW (ref 30–100)

## 2024-02-04 LAB — ECHOCARDIOGRAM COMPLETE
Area-P 1/2: 3.53 cm2
Calc EF: 47.7 %
Est EF: 55
Height: 68 in
S' Lateral: 3.8 cm
Single Plane A2C EF: 43.8 %
Single Plane A4C EF: 49.8 %
Weight: 2179.91 [oz_av]

## 2024-02-04 LAB — CBC
HCT: 27.9 % — ABNORMAL LOW (ref 39.0–52.0)
Hemoglobin: 8.1 g/dL — ABNORMAL LOW (ref 13.0–17.0)
MCH: 19.9 pg — ABNORMAL LOW (ref 26.0–34.0)
MCHC: 29 g/dL — ABNORMAL LOW (ref 30.0–36.0)
MCV: 68.4 fL — ABNORMAL LOW (ref 80.0–100.0)
Platelets: 245 10*3/uL (ref 150–400)
RBC: 4.08 MIL/uL — ABNORMAL LOW (ref 4.22–5.81)
RDW: 28.4 % — ABNORMAL HIGH (ref 11.5–15.5)
WBC: 7 10*3/uL (ref 4.0–10.5)
nRBC: 1 % — ABNORMAL HIGH (ref 0.0–0.2)

## 2024-02-04 LAB — PREPARE RBC (CROSSMATCH)

## 2024-02-04 LAB — HIV ANTIBODY (ROUTINE TESTING W REFLEX): HIV Screen 4th Generation wRfx: NONREACTIVE

## 2024-02-04 MED ORDER — VITAMIN D 25 MCG (1000 UNIT) PO TABS
1000.0000 [IU] | ORAL_TABLET | Freq: Every day | ORAL | Status: DC
  Administered 2024-02-04 – 2024-02-06 (×3): 1000 [IU] via ORAL
  Filled 2024-02-04 (×3): qty 1

## 2024-02-04 MED ORDER — FERROUS SULFATE 325 (65 FE) MG PO TABS
325.0000 mg | ORAL_TABLET | ORAL | Status: DC
  Administered 2024-02-04: 325 mg via ORAL
  Filled 2024-02-04 (×3): qty 1

## 2024-02-04 MED ORDER — ASPIRIN 81 MG PO TBEC
81.0000 mg | DELAYED_RELEASE_TABLET | Freq: Every day | ORAL | Status: DC
  Administered 2024-02-04 – 2024-02-06 (×3): 81 mg via ORAL
  Filled 2024-02-04 (×3): qty 1

## 2024-02-04 MED ORDER — ORAL CARE MOUTH RINSE
15.0000 mL | OROMUCOSAL | Status: DC | PRN
Start: 2024-02-04 — End: 2024-02-06

## 2024-02-04 NOTE — Progress Notes (Signed)
  Progress Note   Patient: Martin Mason FMW:[id number] DOB: 14-Sep-1956 DOA: 02/03/2024     1 DOS: the patient was seen and examined on 02/04/2024 at 9:01 AM      Brief hospital course: 68 y.o. M with PVD/intermittent claudication, chronic anemia, chronic hypokalemia, CKD IIIa baseline Cr 1.1-1.3, HTN and smoking who presented with SOB with exertion and leg swelling.  In the ER, Hgb 5.6, K 2.9, CXR showed congestion, BNP 1500.  Given Lasix , blood transfusion, IV iron  and admitted for further work up.     Assessment and Plan: Acute on chronic diastolic congestive heart failure Ins and outs, and weights not recorded Potassium improved, creatinine improved - Continue IV Lasix  twice daily - Continue potassium supplement - Strict ins and outs, daily weights - Daily BMP   Acute on chronic anemia of iron  deficiency Given 2 units of blood transfusion, also iron  infusion. Iron  studies show severe deficiency.  FOBT negative. Hemoglobin up to 8.1 posttransfusion - Start oral iron  - Outpatient GI vs Heme workup  Vitamin D  deficiency - Start vitamin D   Hypokalemia Potassium up to 3.1 - Continue potassium supplement  Myocardial injury due to CHF No chest pain, troponin elevation is related to congestion No ischemic workup necessary  Chronic kidney disease stage IIIa Creatinine improved to 1.1 with diuresis  Hypertension  intermittent claudication Peripheral vascular disease Smoking Discussed with vascular surgery, he has ABIs and an outpatient follow-up coming up in 1 week. -Continue Lipitor, amlodipine  -Start low-dose aspirin  - Continue Prilosec - Hold lisinopril /HCTZ       Subjective: Patient still swollen, desaturated to 88% with ambulating, out of breath with ambulating short distance.     Physical Exam: BP 136/64 (BP Location: Left Arm)   Pulse 81   Temp 97.7 F (36.5 C) (Oral)   Resp 16   Ht 5' 8 (1.727 m)   Wt 61.8 kg   SpO2 95%   BMI 20.72 kg/m    Adult male, sitting up in bed, interactive and appropriate RRR, no murmurs, 2+ pitting edema to the midshin  Respiratory rate seems increased, diminished at the bases Abdomen soft nontender to palpation or guarding Face symmetric, speech fluent, oriented to person, place, time, generalized weakness    Data Reviewed: Basic metabolic panel shows potassium up to 3.1 BNP 1500 Creatinine down to 1.1 Echocardiogram pending Chest x-ray shows bilateral hazy opacities Ferritin and iron  saturation both less than 10 COVID and flu negative    Family Communication: None present    Disposition: Status is: Inpatient Patient still desaturating to 88% with ambulating on room air, also with persistent edema and pending echo        Author: Lonni SHAUNNA Dalton, MD 02/04/2024 2:20 PM  For on call review [email or web address].

## 2024-02-04 NOTE — Hospital Course (Addendum)
 68 y.o. M with PVD/intermittent claudication, chronic anemia, chronic hypokalemia, CKD IIIa baseline Cr 1.1-1.3, HTN and smoking who presented with SOB with exertion and leg swelling.  In the ER, Hgb 5.6, K 2.9, CXR showed congestion, BNP 1500.  Given Lasix , blood transfusion, IV iron  and admitted for further work up.

## 2024-02-04 NOTE — Progress Notes (Signed)
 Pt ambulated 500 ft in the hallway on room air. O2 saturated between 88-92%, heart rate remained in the 70s. Complained of shortness of breath. Returned back to room and put back on 2L to regain breath. Left saturating at 95% on 2L.

## 2024-02-04 NOTE — Progress Notes (Signed)
  Echocardiogram 2D Echocardiogram has been performed.  Royden Corin 02/04/2024, 11:17 AM

## 2024-02-04 NOTE — Plan of Care (Signed)

## 2024-02-05 DIAGNOSIS — I5033 Acute on chronic diastolic (congestive) heart failure: Secondary | ICD-10-CM | POA: Diagnosis not present

## 2024-02-05 LAB — TYPE AND SCREEN
ABO/RH(D): O POS
Antibody Screen: NEGATIVE
Unit division: 0
Unit division: 0

## 2024-02-05 LAB — BPAM RBC
Blood Product Expiration Date: 202503122359
Blood Product Expiration Date: 202503122359
ISSUE DATE / TIME: 202502090412
ISSUE DATE / TIME: 202502081640
Unit Type and Rh: 5100
Unit Type and Rh: 5100

## 2024-02-05 LAB — BASIC METABOLIC PANEL
Anion gap: 9 (ref 5–15)
BUN: 16 mg/dL (ref 8–23)
CO2: 23 mmol/L (ref 22–32)
Calcium: 8.5 mg/dL — ABNORMAL LOW (ref 8.9–10.3)
Chloride: 103 mmol/L (ref 98–111)
Creatinine, Ser: 1.1 mg/dL (ref 0.61–1.24)
GFR, Estimated: >60 mL/min (ref >60)
Glucose, Bld: 85 mg/dL (ref 70–99)
Potassium: 2.7 mmol/L — CL (ref 3.5–5.1)
Sodium: 135 mmol/L (ref 135–145)

## 2024-02-05 LAB — CBC
HCT: 27.8 % — ABNORMAL LOW (ref 39.0–52.0)
Hemoglobin: 8.4 g/dL — ABNORMAL LOW (ref 13.0–17.0)
MCH: 20.5 pg — ABNORMAL LOW (ref 26.0–34.0)
MCHC: 30.2 g/dL (ref 30.0–36.0)
MCV: 68 fL — ABNORMAL LOW (ref 80.0–100.0)
Platelets: 252 10*3/uL (ref 150–400)
RBC: 4.09 MIL/uL — ABNORMAL LOW (ref 4.22–5.81)
RDW: 27.9 % — ABNORMAL HIGH (ref 11.5–15.5)
WBC: 6.6 10*3/uL (ref 4.0–10.5)
nRBC: 1.1 % — ABNORMAL HIGH (ref 0.0–0.2)

## 2024-02-05 MED ORDER — POTASSIUM CHLORIDE 10 MEQ/100ML IV SOLN
10.0000 meq | INTRAVENOUS | Status: AC
Start: 2024-02-05 — End: 2024-02-05
  Administered 2024-02-05 (×3): 10 meq via INTRAVENOUS
  Filled 2024-02-05 (×3): qty 100

## 2024-02-05 MED ORDER — FUROSEMIDE 10 MG/ML IJ SOLN
40.0000 mg | Freq: Two times a day (BID) | INTRAMUSCULAR | Status: DC
  Administered 2024-02-05 – 2024-02-06 (×2): 40 mg via INTRAVENOUS
  Filled 2024-02-05 (×2): qty 4

## 2024-02-05 MED ORDER — POTASSIUM CHLORIDE CRYS ER 20 MEQ PO TBCR
40.0000 meq | EXTENDED_RELEASE_TABLET | Freq: Two times a day (BID) | ORAL | Status: DC
  Administered 2024-02-05: 40 meq via ORAL
  Filled 2024-02-05: qty 2

## 2024-02-05 MED ORDER — POTASSIUM CHLORIDE CRYS ER 20 MEQ PO TBCR
40.0000 meq | EXTENDED_RELEASE_TABLET | Freq: Three times a day (TID) | ORAL | Status: AC
Start: 2024-02-05 — End: 2024-02-05
  Administered 2024-02-05 (×2): 40 meq via ORAL
  Filled 2024-02-05 (×2): qty 2

## 2024-02-05 NOTE — Progress Notes (Signed)
 Mobility Specialist - Progress Note  (RA) Pre-mobility: 94% SpO2 During mobility: 88% SpO2 Post-mobility: 90% SPO2   02/05/24 1314  Mobility  Activity Ambulated independently in hallway  Level of Assistance Independent after set-up  Assistive Device None  Distance Ambulated (ft) 100 ft  Range of Motion/Exercises Active  Activity Response Tolerated well  Mobility Referral Yes  Mobility visit 1 Mobility  Mobility Specialist Start Time (ACUTE ONLY) 1300  Mobility Specialist Stop Time (ACUTE ONLY) 1314  Mobility Specialist Time Calculation (min) (ACUTE ONLY) 14 min   Pt was found in bed and agreeable to ambulate. Stated feeling SOB with session and grew fatigued. At EOS returned to bed with all needs met. Call bell in reach.  Lorna Rose Mobility Specialist

## 2024-02-05 NOTE — Plan of Care (Signed)

## 2024-02-05 NOTE — Progress Notes (Signed)
  Progress Note   Patient: Martin Mason MWU:[id number] DOB: [birth year] DOA: 02/03/2024     2 DOS: the patient was seen and examined on 02/05/2024 at 10:25AM      Brief hospital course: 68 y.o. M with PVD/intermittent claudication, chronic anemia, chronic hypokalemia, CKD IIIa baseline Cr 1.1-1.3, HTN and smoking who presented with SOB with exertion and leg swelling.  In the ER, Hgb 5.6, K 2.9, CXR showed congestion, BNP 1500.  Given Lasix , blood transfusion, IV iron and admitted for further work up.     Assessment and Plan: Acute on chronic diastolic congestive heart failure Net -2.5 L, weight down to 58.8 kg today.  Renal function improved, potassium low Still out of breath and desaturating to 88% with ambulation Echocardiogram shows preserved EF - Continue IV Lasix , increase to twice daily - Daily weights, strict ins and outs, daily BMP - Continue supplement potassium, increase dose   Acute on chronic iron deficiency anemia Admitted and transfused 1 unit on admission, hemoglobin up to 8.1 and stable, FOBT negative.  Given IV iron - Continue oral iron  Vitamin D  deficiency -Continue vitamin D   Hypokalemia -Supplement aggressively  Chronic kidney disease Creatinine stable/improved from admission  Intermittent claudication/peripheral vascular disease -Follow-up with Dr. Fulton Job for ABIs after discharge - Continue aspirin  and atorvastatin   Hypertension Blood pressure controlled - Continue amlodipine   Smoking  Myocardial injury due to CHF      Subjective: Patient still very short of breath with ambulation, he still has swelling in both ankles      Physical Exam: BP 125/76 (BP Location: Right Arm)   Pulse 71   Temp 98.6 F (37 C) (Oral)   Resp (!) 25   Ht 5\' 8"  (1.727 m)   Wt 58.8 kg   SpO2 (!) 88% Comment: Ambulating  BMI 19.71 kg/m   Thin adult male, lying in bed, interactive and appropriate RRR, no murmurs, edema at bilateral ankles, 1+ Respiratory  rate normal, coarse and diminished at bilateral bases Abdomen soft without tenderness palpation or guarding, no ascites or distention Attention normal, affect blunted, judgment and insight appear normal, face symmetric, upper extremity strength generalized weakness With symmetric    Data Reviewed: Echocardiogram report reviewed, EF 55% Basic metabolic panel shows potassium down to 2.7, creatinine stable 1.1 CBC sustained anemia Iron studies show iron deficiency  Family Communication: None present    Disposition: Status is: Inpatient         Author: Ephriam Hashimoto, MD 02/05/2024 3:28 PM  For on call review [email or web address].

## 2024-02-06 DIAGNOSIS — I5033 Acute on chronic diastolic (congestive) heart failure: Secondary | ICD-10-CM | POA: Diagnosis not present

## 2024-02-06 LAB — CBC
HCT: 30.4 % — ABNORMAL LOW (ref 39.0–52.0)
Hemoglobin: 8.5 g/dL — ABNORMAL LOW (ref 13.0–17.0)
MCH: 19.8 pg — ABNORMAL LOW (ref 26.0–34.0)
MCHC: 28 g/dL — ABNORMAL LOW (ref 30.0–36.0)
MCV: 70.9 fL — ABNORMAL LOW (ref 80.0–100.0)
Platelets: 252 10*3/uL (ref 150–400)
RBC: 4.29 MIL/uL (ref 4.22–5.81)
RDW: 29.2 % — ABNORMAL HIGH (ref 11.5–15.5)
WBC: 7.2 10*3/uL (ref 4.0–10.5)
nRBC: 0.7 % — ABNORMAL HIGH (ref 0.0–0.2)

## 2024-02-06 LAB — BASIC METABOLIC PANEL
Anion gap: 7 (ref 5–15)
BUN: 13 mg/dL (ref 8–23)
CO2: 23 mmol/L (ref 22–32)
Calcium: 8.6 mg/dL — ABNORMAL LOW (ref 8.9–10.3)
Chloride: 104 mmol/L (ref 98–111)
Creatinine, Ser: 1.14 mg/dL (ref 0.61–1.24)
GFR, Estimated: >60 mL/min (ref >60)
Glucose, Bld: 85 mg/dL (ref 70–99)
Potassium: 4.1 mmol/L (ref 3.5–5.1)
Sodium: 134 mmol/L — ABNORMAL LOW (ref 135–145)

## 2024-02-06 LAB — MAGNESIUM: Magnesium: 2 mg/dL (ref 1.7–2.4)

## 2024-02-06 MED ORDER — POTASSIUM CHLORIDE CRYS ER 20 MEQ PO TBCR: 20.0000 meq | EXTENDED_RELEASE_TABLET | Freq: Every day | ORAL | 0 refills | Status: DC

## 2024-02-06 MED ORDER — LISINOPRIL 20 MG PO TABS: 20.0000 mg | ORAL_TABLET | Freq: Every day | ORAL | 0 refills | Status: DC

## 2024-02-06 MED ORDER — FUROSEMIDE 20 MG PO TABS: 20.0000 mg | ORAL_TABLET | Freq: Every day | ORAL | 0 refills | Status: DC

## 2024-02-06 MED ORDER — VITAMIN D3 25 MCG PO TABS
1000.0000 [IU] | ORAL_TABLET | Freq: Every day | ORAL | Status: DC
Start: 2024-02-07 — End: 2024-06-30

## 2024-02-06 MED ORDER — ASPIRIN 81 MG PO TBEC: 81.0000 mg | DELAYED_RELEASE_TABLET | Freq: Every day | ORAL | 12 refills | Status: DC

## 2024-02-06 NOTE — TOC Transition Note (Signed)
Transition of Care [facility]) - Discharge Note   Patient Details  Name: Martin Mason MRN: [id number] Date of Birth: Nov 16, 1956  Transition of Care [facility]) CM/SW Contact:  Lanier Clam, RN Phone Number: 02/06/2024, 9:11 AM   Clinical Narrative: d/c home No CM needs.      Final next level of care: Home/Self Care Barriers to Discharge: No Barriers Identified   Patient Goals and CMS Choice Patient states their goals for this hospitalization and ongoing recovery are:: Home CMS Medicare.gov Compare Post Acute Care list provided to:: Patient Choice offered to / list presented to : Patient      Discharge Placement                       Discharge Plan and Services Additional resources added to the After Visit Summary for                                       Social Drivers of Health (SDOH) Interventions SDOH Screenings   Food Insecurity: No Food Insecurity (02/03/2024)  Housing: Low Risk  (02/06/2023)  Transportation Needs: No Transportation Needs (02/03/2024)  Utilities: Not At Risk (02/03/2024)  Alcohol Screen: Low Risk  (02/06/2023)  Depression (PHQ2-9): Low Risk  (01/17/2024)  Financial Resource Strain: Low Risk  (02/06/2023)  Physical Activity: Sufficiently Active (02/06/2023)  Social Connections: Moderately Isolated (02/03/2024)  Stress: No Stress Concern Present (02/06/2023)  Tobacco Use: High Risk (02/03/2024)     Readmission Risk Interventions     No data to display

## 2024-02-06 NOTE — Discharge Summary (Signed)
Physician Discharge Summary   Patient: Martin Mason MRN: [id number] DOB: 1956/02/24  Admit date:     02/03/2024  Discharge date: 02/06/24  Discharge Physician: Alberteen Sam   PCP: Hoy Register, MD     Recommendations at discharge:  Follow up with PCP Dr. Alvis Lemmings in 1 week for CHF flare Dr. 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 M with PVD/intermittent claudication, chronic anemia, chronic hypokalemia, CKD IIIa baseline Cr 1.1-1.3, HTN and smoking who presented with SOB with exertion and leg swelling.  In the ER, Hgb 5.6, K 2.9, CXR showed congestion, BNP 1500.  Given Lasix, blood transfusion, IV iron and admitted for further work up.     Acute on chronic diastolic congestive heart failure Admitted on IV Lasix.  Diuresed 5.0L, swelling resolved.  Weaned off of oxygen, ambulated without dyspnea.  Echocardiogram showed EF 55%.  He was discharged on new oral Lasix and potassium.  Recommend BMP in 1 week      Acute on chronic iron deficiency anemia The patient was admitted and transfused 1 unit PRBCs on admission, given IV iron.  Discharged on oral iron.  FOBT was negative.  Recommend follow-up with PCP for monitoring iron levels, referral to GI if no previous GI workup     Vitamin D  deficiency Incidental finding, started on vitamin D supplementation   Intermittent claudication/peripheral vascular disease At baseline. Patient has pending follow up with Vascular surgery.  Started aspirin, continued Lipitor.      Smoking  Recommended cessation  Myocardial injury due to CHF No ischemic work up needed here.        The [facility] Controlled Substances Registry was reviewed for this patient prior to discharge.   Consultants: None Procedures performed: Echo  Disposition: Home Diet recommendation:  Discharge Diet Orders (From admission, onward)     Start     Ordered   02/06/24 0000  Diet - low sodium heart healthy        02/06/24 0840             DISCHARGE MEDICATION: Allergies as of 02/06/2024   No Known Allergies      Medication List     STOP taking these medications    lisinopril-hydrochlorothiazide 20-25 MG tablet Commonly known as: ZESTORETIC       TAKE these medications    amLODipine 10 MG tablet Commonly known as: NORVASC Take 1 tablet (10 mg total) by mouth daily. Notes to patient: Last Dose of this Medication was given on February 06, 2024 at 7:41 am   aspirin EC 81 MG tablet Take 1 tablet (81 mg total) by mouth daily. Swallow whole. Start taking on: February 07, 2024 Notes to patient: Last Dose of this Medication was given on February 06, 2024  at 7:41 am   atorvastatin 20 MG tablet Commonly known as: LIPITOR Take 1 tablet (20 mg total) by mouth daily. Notes to patient: Last Dose of this Medication was given on February 06, 2024 at 7:42 am   cetirizine 10 MG tablet Commonly known as: ZYRTEC Take 10 mg by mouth daily.   EQ Allergy Relief (Cetirizine) 10 MG tablet Generic drug: cetirizine Take 1 tablet by mouth once daily   ferrous sulfate 325 (65 FE) MG tablet Take 1 tablet (325 mg total) by mouth daily.   fluticasone 50 MCG/ACT nasal spray Commonly known as: FLONASE Use 2 spray(s) in each nostril once  daily What changed: See the new instructions.   furosemide 20 MG tablet Commonly known as: LASIX Take 1 tablet (20 mg total) by mouth daily. What changed:  when to take this reasons to take this Notes to patient: Last Dose of this Medication was given on February 06, 2024 at 7:41 am   lisinopril 20 MG tablet Commonly known as: ZESTRIL Take 1 tablet (20 mg total) by mouth daily.   omeprazole 40 MG capsule Commonly known as: PRILOSEC Take 1 capsule (40 mg total) by mouth daily.   polyethylene glycol 17 g packet Commonly known as: MiraLax Take 17 g by mouth daily.   potassium chloride SA 20 MEQ tablet Commonly known as: KLOR-CON M Take 1 tablet (20 mEq total) by mouth daily. What changed: when to take this   sildenafil 50 MG tablet Commonly known as: VIAGRA TAKE 1 TABLET BY MOUTH ONCE DAILY AS NEEDED FOR ERECTILE DYSFUNCTION AT  LEAST  24  HOURS  BETWEEN  DOSES   vitamin D3 25 MCG tablet Commonly known as: CHOLECALCIFEROL Take 1 tablet (1,000 Units total) by mouth daily. Start taking on: February 07, 2024 Notes to patient: Last Dose of this Medication was given on February 06, 2024 at 7:42 am        Follow-up Information     Hoy Register, MD. Schedule an appointment as soon as possible for a visit in 1 week(s).   Specialty: Family Medicine Contact information: [address] [place] [address] [phone]                 Discharge Instructions     (HEART FAILURE PATIENTS) Call MD:  Anytime you have any of the following symptoms: 1) 3 pound weight gain in 24 hours or 5 pounds in 1 week 2) shortness of breath, with or without a dry hacking cough 3) swelling in the hands, feet or stomach 4) if you have to sleep on extra pillows at night in order to breathe.   Complete by: As directed    Diet - low sodium heart healthy   Complete by: As directed    Discharge instructions   Complete by: As directed    **IMPORTANT DISCHARGE  INSTRUCTIONS**   From Dr. Maryfrances Bunnell: You were admitted for an episode of heart failure  You were treated here with diuretics and this resolved  You should continue Lasix at home  Take furosemide/Lasix 20 mg daily every day Take it with potassium to prevent low potassium  Weigh yourself daily Your weight today when you get home (or tomorrow morning) is your DRY WEIGHT (Your weight when all the water is off)  If your weight is ever 5 pounds MORE THAN YOUR DRY WEIGHT, you should call Dr. Alvis Lemmings  Continue your other home medicines  Go see Dr. Chestine Spore as planned  Start taking  low dose aspirin 81 mg every day  Because you are on the new medicine Lasix, STOP your old blood pressure medicine hydrochlorothiazide/HCTZ  Replace it with just lisinopril alone, I have sent a new prescription   IMPORTANT: get your labs checked in 1 week by Dr. Alvis Lemmings I have sent a Cardiology referral to get you an appointment with a Cardiologist  You are low in vitamin D, so you should start a vitamin D supplement and Dr. Alvis Lemmings should recheck your level   Increase activity slowly   Complete by: As directed        Discharge Exam: Filed Weights   02/04/24 0500 02/05/24 0500 02/06/24 0529  Weight: 61.8 kg 58.8 kg 56.2 kg    General: Pt is alert, awake, not in acute distress Cardiovascular: RRR, nl S1-S2, no murmurs appreciated.   No LE edema.   Respiratory: Normal respiratory rate and rhythm.  CTAB without rales or wheezes. Abdominal: Abdomen soft and non-tender.  No distension or HSM.   Neuro/Psych: Strength symmetric in upper and lower extremities.  Judgment and insight appear normal.   Condition at discharge: good  The results of significant diagnostics from this hospitalization (including imaging, microbiology, ancillary and laboratory) are listed below for reference.   Imaging Studies: ECHOCARDIOGRAM COMPLETE Result Date: 02/04/2024    ECHOCARDIOGRAM REPORT   Patient Name:   GYAN CAMBRE Date  of Exam: 02/04/2024 Medical Rec #:  [id number]   Height:       68.0 in Accession #:    [id number]  Weight:       136.2 lb Date of Birth:  12-Sep-1956  BSA:          1.736 m Patient Age:    67 years    BP:           123/59 mmHg Patient Gender: M           HR:           71 bpm. Exam Location:  Inpatient Procedure: 2D Echo, 3D Echo, Cardiac Doppler and Color Doppler Indications:    R06.02 SOB  History:        Patient has no prior history of Echocardiogram examinations.                 CHF, Signs/Symptoms:Edema; Risk Factors:Hypertension and Current                 Smoker. ETOH. Cancer. Marijuana use.  Sonographer:    Sheralyn Boatman RDCS Referring Phys: [id number] PROSPER M AMPONSAH IMPRESSIONS  1. Left ventricular ejection fraction, by estimation, is 55%. Left ventricular ejection fraction by 3D volume is 52 %. The left ventricle has normal function. The left ventricle demonstrates global hypokinesis. The left ventricular internal cavity size was mildly dilated. Left ventricular diastolic parameters are consistent with Grade I diastolic dysfunction (impaired relaxation).  2. Right ventricular systolic function is mildly reduced. The right ventricular size is moderately enlarged. There is normal pulmonary artery systolic pressure.  3. Left atrial size was mildly dilated.  4. Right atrial size was severely dilated.  5. The mitral valve is normal in structure. Mild mitral valve regurgitation. No evidence of mitral stenosis.  6. The aortic valve is tricuspid. Aortic valve regurgitation is not visualized. No aortic stenosis is present.  7. Aortic dilatation noted. There is borderline dilatation of the ascending aorta, measuring 38 mm.  8. The inferior vena cava is normal in size with <50% respiratory variability, suggesting right atrial pressure of 8 mmHg.  Comparison(s): No prior Echocardiogram. FINDINGS  Left Ventricle: Left ventricular ejection fraction, by estimation, is 55%. Left ventricular ejection fraction by 3D volume is 52 %.  The left ventricle has normal function. The left ventricle demonstrates global hypokinesis. The left ventricular internal  cavity size was mildly dilated. There is no left ventricular hypertrophy. Left ventricular diastolic parameters are consistent with Grade I diastolic dysfunction (impaired relaxation). Normal left ventricular filling pressure. Right Ventricle: The right ventricular size is moderately enlarged. No increase in right ventricular wall thickness. Right ventricular systolic function is mildly reduced. There is normal pulmonary artery systolic pressure. The tricuspid regurgitant velocity is 2.11 m/s, and with an assumed right atrial pressure of 8 mmHg, the estimated right ventricular systolic pressure is 25.8 mmHg. Left Atrium: Left atrial size was mildly dilated. Right Atrium: Right atrial size was severely dilated. Pericardium: There is no evidence of pericardial effusion. Mitral Valve: The mitral valve is normal in structure. Mild mitral valve regurgitation. No evidence of mitral valve stenosis. Tricuspid Valve: The tricuspid valve is normal in structure. Tricuspid valve regurgitation is mild . No evidence of tricuspid stenosis. Aortic Valve: The aortic valve is tricuspid. Aortic valve regurgitation is not visualized. No aortic stenosis is present. Pulmonic Valve: The pulmonic valve was normal in structure. Pulmonic valve regurgitation is trivial. No evidence of pulmonic stenosis. Aorta: Aortic dilatation noted and the aortic root is normal in size and structure. There is borderline dilatation of the ascending aorta, measuring 38 mm. Venous: The inferior vena cava is normal in size with less than 50% respiratory variability, suggesting right atrial pressure of 8 mmHg. IAS/Shunts: No atrial level shunt detected by color flow Doppler.  LEFT VENTRICLE PLAX 2D LVIDd:         5.10 cm         Diastology LVIDs:         3.80 cm         LV e' medial:    7.94 cm/s LV PW:         0.90 cm         LV E/e'  medial:  9.3 LV IVS:        0.90 cm         LV e' lateral:   8.92 cm/s LVOT diam:     2.30 cm         LV E/e' lateral: 8.2 LV SV:         76 LV SV Index:   44 LVOT Area:     4.15 cm        3D Volume EF                                LV 3D EF:    Left                                             ventricul LV Volumes (MOD)                            ar LV vol d, MOD    95.0 ml                    ejection A2C:  fraction LV vol d, MOD    84.2 ml                    by 3D A4C:                                        volume is LV vol s, MOD    53.4 ml                    52 %. A2C: LV vol s, MOD    42.3 ml A4C:                           3D Volume EF: LV SV MOD A2C:   41.6 ml       3D EF:        52 % LV SV MOD A4C:   84.2 ml LV SV MOD BP:    44.5 ml RIGHT VENTRICLE            IVC RV S prime:     7.40 cm/s  IVC diam: 1.90 cm TAPSE (M-mode): 1.5 cm LEFT ATRIUM             Index        RIGHT ATRIUM           Index LA diam:        3.20 cm 1.84 cm/m   RA Area:     23.70 cm LA Vol (A2C):   80.8 ml 46.54 ml/m  RA Volume:   79.10 ml  45.56 ml/m LA Vol (A4C):   42.8 ml 24.65 ml/m LA Biplane Vol: 62.3 ml 35.89 ml/m  AORTIC VALVE LVOT Vmax:   103.00 cm/s LVOT Vmean:  64.900 cm/s LVOT VTI:    0.184 m  AORTA Ao Root diam: 3.30 cm Ao Asc diam:  3.80 cm MITRAL VALVE               TRICUSPID VALVE MV Area (PHT): 3.53 cm    TR Peak grad:   17.8 mmHg MV Decel Time: 215 msec    TR Vmax:        211.00 cm/s MV E velocity: 73.50 cm/s MV A velocity: 71.00 cm/s  SHUNTS MV E/A ratio:  1.04        Systemic VTI:  0.18 m                            Systemic Diam: 2.30 cm Vishnu Priya Mallipeddi Electronically signed by Winfield Rast Mallipeddi Signature Date/Time: 02/04/2024/3:26:13 PM    Final    DG Chest Portable 1 View Result Date: 02/03/2024 CLINICAL DATA:  Shortness of breath. EXAM: PORTABLE CHEST 1 VIEW COMPARISON:  10/30/2015 FINDINGS: The cardio pericardial silhouette is enlarged. The lungs are clear  without focal pneumonia, edema, pneumothorax or pleural effusion. Interstitial markings are diffusely coarsened with chronic features. Bones are diffusely demineralized. Telemetry leads overlie the chest. IMPRESSION: Chronic interstitial coarsening without acute cardiopulmonary findings. Electronically Signed   By: Kennith Center M.D.   On: 02/03/2024 14:04   DG Knee Complete 4 Views Right Result Date: 02/03/2024 CLINICAL DATA:  Swelling and intermittent knee pain, no stated injury EXAM: RIGHT KNEE - COMPLETE 4+ VIEW; LEFT KNEE - COMPLETE 4+ VIEW COMPARISON:  None Available. FINDINGS: No evidence of  fracture, dislocation, or joint effusion. No evidence of arthropathy or other focal bone abnormality. Vascular calcinosis. IMPRESSION: 1. No fracture or dislocation of the bilateral knees. Joint spaces are preserved. 2. Vascular calcinosis. Electronically Signed   By: Jearld Lesch M.D.   On: 02/03/2024 14:03   DG Knee Complete 4 Views Left Result Date: 02/03/2024 CLINICAL DATA:  Swelling and intermittent knee pain, no stated injury EXAM: RIGHT KNEE - COMPLETE 4+ VIEW; LEFT KNEE - COMPLETE 4+ VIEW COMPARISON:  None Available. FINDINGS: No evidence of fracture, dislocation, or joint effusion. No evidence of arthropathy or other focal bone abnormality. Vascular calcinosis. IMPRESSION: 1. No fracture or dislocation of the bilateral knees. Joint spaces are preserved. 2. Vascular calcinosis. Electronically Signed   By: Jearld Lesch M.D.   On: 02/03/2024 14:03    Microbiology: Results for orders placed or performed during the hospital encounter of 02/03/24  Resp panel by RT-PCR (RSV, Flu A&B, Covid) Anterior Nasal Swab     Status: None   Collection Time: 02/03/24 11:32 AM   Specimen: Anterior Nasal Swab  Result Value Ref Range Status   SARS Coronavirus 2 by RT PCR NEGATIVE NEGATIVE Final    Comment: (NOTE) SARS-CoV-2 target nucleic acids are NOT DETECTED.  The SARS-CoV-2 RNA is generally detectable in upper  respiratory specimens during the acute phase of infection. The lowest concentration of SARS-CoV-2 viral copies this assay can detect is 138 copies/mL. A negative result does not preclude SARS-Cov-2 infection and should not be used as the sole basis for treatment or other patient management decisions. A negative result may occur with  improper specimen collection/handling, submission of specimen other than nasopharyngeal swab, presence of viral mutation(s) within the areas targeted by this assay, and inadequate number of viral copies(<138 copies/mL). A negative result must be combined with clinical observations, patient history, and epidemiological information. The expected result is Negative.  Fact Sheet for Patients:  BloggerCourse.com  Fact Sheet for Healthcare Providers:  SeriousBroker.it  This test is no t yet approved or cleared by the [place] FDA and  has been authorized for detection and/or diagnosis of SARS-CoV-2 by FDA under an Emergency Use Authorization (EUA). This EUA will remain  in effect (meaning this test can be used) for the duration of the COVID-19 declaration under Section 564(b)(1) of the Act, 21 U.S.C.section 360bbb-3(b)(1), unless the authorization is terminated  or revoked sooner.       Influenza A by PCR NEGATIVE NEGATIVE Final   Influenza B by PCR NEGATIVE NEGATIVE Final    Comment: (NOTE) The Xpert Xpress SARS-CoV-2/FLU/RSV plus assay is intended as an aid in the diagnosis of influenza from Nasopharyngeal swab specimens and should not be used as a sole basis for treatment. Nasal washings and aspirates are unacceptable for Xpert Xpress SARS-CoV-2/FLU/RSV testing.  Fact Sheet for Patients: BloggerCourse.com  Fact Sheet for Healthcare Providers: SeriousBroker.it  This test is not yet approved or cleared by the [place] FDA and has been  authorized for detection and/or diagnosis of SARS-CoV-2 by FDA under an Emergency Use Authorization (EUA). This EUA will remain in effect (meaning this test can be used) for the duration of the COVID-19 declaration under Section 564(b)(1) of the Act, 21 U.S.C. section 360bbb-3(b)(1), unless the authorization is terminated or revoked.     Resp Syncytial Virus by PCR NEGATIVE NEGATIVE Final    Comment: (NOTE) Fact Sheet for Patients: BloggerCourse.com  Fact Sheet for Healthcare Providers: SeriousBroker.it  This test is not yet approved or cleared by  the [employer] and has been authorized for detection and/or diagnosis of SARS-CoV-2 by FDA under an Emergency Use Authorization (EUA). This EUA will remain in effect (meaning this test can be used) for the duration of the COVID-19 declaration under Section 564(b)(1) of the Act, 21 U.S.C. section 360bbb-3(b)(1), unless the authorization is terminated or revoked.  Performed at [facility], [address] [place], [place] [address]     Labs: CBC: Recent Labs  Lab 02/03/24 1147 02/03/24 2210 02/04/24 0913 02/05/24 0511 02/06/24 0540  WBC 5.6  --  7.0 6.6 7.2  NEUTROABS 4.0  --   --   --   --   HGB 5.6* 6.7* 8.1* 8.4* 8.5*  HCT 21.0* 23.9* 27.9* 27.8* 30.4*  MCV 64.6*  --  68.4* 68.0* 70.9*  PLT 151  --  245 252 252   Basic Metabolic Panel: Recent Labs  Lab 02/03/24 1147 02/03/24 1408 02/03/24 1630 02/04/24 0913 02/05/24 0511 02/06/24 0540  NA 135  --   --  134* 135 134*  K 2.9*  --   --  3.1* 2.7* 4.1  CL 104  --   --  102 103 104  CO2 16*  --   --  19* 23 23  GLUCOSE 95  --   --  78 85 85  BUN 21  --   --  19 16 13   CREATININE 1.32*  --   --  1.19 1.10 1.14  CALCIUM 8.4*  --   --  8.7* 8.5* 8.6*  MG  --  2.2  --   --   --  2.0  PHOS  --   --  3.9  --   --   --    Liver Function Tests: Recent Labs  Lab 02/03/24 1147  AST 29  ALT 18   ALKPHOS 79  BILITOT 1.0  PROT 6.8  ALBUMIN 3.3*   CBG: No results for input(s): "GLUCAP" in the last 168 hours.  Discharge time spent: approximately 45 minutes spent on discharge counseling, evaluation of patient on day of discharge, and coordination of discharge planning with nursing, social work, pharmacy and case management  Signed: Alberteen Sam, MD [facility] 02/06/2024

## 2024-02-06 NOTE — Plan of Care (Signed)

## 2024-02-06 NOTE — Progress Notes (Signed)
Heart Failure Navigator Progress Note  Assessed for Heart & Vascular TOC clinic readiness.  Patient does not meet criteria due to EF 55%, has a scheduled follow up with [facility] on 02/13/2024. .   Navigator will sign off at this time.   Rhae Hammock, BSN, Scientist, clinical (histocompatibility and immunogenetics) Only

## 2024-02-06 NOTE — Progress Notes (Signed)
Patient to be discharged to home today. All discharge instructions including all discharge Medications and schedules for these Medications reviewed with the Patient. Patient verbalized understanding of ll discharge instructions. Discharge AVS with the Patient at time of discharge.

## 2024-02-07 ENCOUNTER — Telehealth: Payer: Self-pay

## 2024-02-07 NOTE — Transitions of Care (Post Inpatient/ED Visit) (Signed)
   02/07/2024  Name: Martin Mason MRN: [id number] DOB: 1956-03-19  Today's TOC FU Call Status: Today's TOC FU Call Status:: Unsuccessful Call (1st Attempt) Unsuccessful Call (1st Attempt) Date: 02/07/24  Attempted to reach the patient regarding the most recent Inpatient/ED visit. Left confidential voice mail that outreach would be attempted again later.   Follow Up Plan: Additional outreach attempts will be made to reach the patient to complete the Transitions of Care (Post Inpatient/ED visit) call.    Gabriel Cirri MSN, RN RN Case Sales executive Health  [facility] Office Hours Wed/Thur  8:00 am-6:00 pm Direct Dial: [phone] Main Phone [phone]  Fax: [phone] [email or web address]

## 2024-02-08 ENCOUNTER — Telehealth: Payer: Self-pay

## 2024-02-08 ENCOUNTER — Telehealth: Payer: Self-pay | Admitting: *Deleted

## 2024-02-08 NOTE — Transitions of Care (Post Inpatient/ED Visit) (Signed)
02/08/2024  Name: Martin Mason MRN: [id number] DOB: 1956-01-08  Today's TOC FU Call Status: Today's TOC FU Call Status:: Successful TOC FU Call Completed Unsuccessful Call (1st Attempt) Date: 02/08/24 Patient's Name and Date of Birth confirmed.  Transition Care Management Follow-up Telephone Call: Patient stated he felt much better and legs felt much better.  Date of Discharge: 02/06/24 Discharge Facility: [facility]) Type of Discharge: Inpatient Admission Primary Inpatient Discharge Diagnosis:: Acute on Chronic Diastolic HF How have you been since you were released from the hospital?: Better Any questions or concerns?: No  Items Reviewed: Medications obtained,verified, and reconciled?: Partial Review Completed Reason for Partial Mediation Review: Was unclear if patient was just going off list or actually had New medications in hand. See note in TOC follow up documentation. Any new allergies since your discharge?: No  Medications Reviewed Today: Reviewed to reconcile but had some barriers to their understanding of the difference between the list and the actual medications in hand. Stressed the changes made on discharge to Lisinopril (plain), and Lasix as two medications from Lisinopril with hydrochlorothiazide as a combo pill patient was taking prior to admission. Brother in Social worker to contact pharmacy to see if Furosemide was filled and picked up as they could not locate the pill bottle itself. They became confused and the call ended at this point. CM called PCP office prior to this call to see if patient had made follow up appointment and had not, scheduler was going to try to get patient in next week on 2/19/ or 02/15/24 where another reconciliation will take place. A follow up call is needed to ensure proper medications in hand.  Medications Reviewed Today     Reviewed by Bing Quarry, RN (Case Manager) on 02/08/24 at 1439  Med List Status: <None>   Medication Order Taking? Sig  Documenting Provider Last Dose Status Informant  amLODipine (NORVASC) 10 MG tablet [id number] Yes Take 1 tablet (10 mg total) by mouth daily. Hoy Register, MD Taking Active Family Member           Med Note Antony Madura, Dyane Dustman Feb 04, 2024  5:23 PM) The patient's brother-in-law confirmed this as one of the patient's "active" meds, but he (the patient, himself) could not provide a last-taken time.  aspirin EC 81 MG tablet [id number] Yes Take 1 tablet (81 mg total) by mouth daily. Swallow whole. Danford, Earl Lites, MD Taking Active   atorvastatin (LIPITOR) 20 MG tablet [id number] Yes Take 1 tablet (20 mg total) by mouth daily. Hoy Register, MD Taking Active Family Member           Med Note Antony Madura, Dyane Dustman Feb 04, 2024  5:23 PM) The patient's brother-in-law confirmed this as one of the patient's "active" meds, but he (the patient, himself) could not provide a last-taken time.  cetirizine (ZYRTEC) 10 MG tablet [id number] Yes Take 10 mg by mouth daily. [provider] Taking Active Family Member           Med Note Antony Madura, Dyane Dustman Feb 04, 2024  5:23 PM) The patient's brother-in-law confirmed this as one of the patient's "active" meds, but he (the patient, himself) could not provide a last-taken time.  cholecalciferol (CHOLECALCIFEROL) 25 MCG tablet [id number] No Take 1 tablet (1,000 Units total) by mouth daily.  Patient not taking: Reported on 02/08/2024   Alberteen Sam, MD Not Taking Active   EQ ALLERGY RELIEF, CETIRIZINE, 10 MG tablet  [id number] No Take 1 tablet by mouth once daily  Patient not taking: Reported on 02/08/2024   Hoy Register, MD Not Taking Active   ferrous sulfate 325 (65 FE) MG tablet [id number] Yes Take 1 tablet (325 mg total) by mouth daily. Rondel Baton, MD Taking Active   fluticasone [facility]) 50 MCG/ACT nasal spray [id number] Yes Use 2 spray(s) in each nostril once daily  Patient taking differently: Place 2 sprays into both nostrils daily.    Hoy Register, MD Taking Active Family Member           Med Note Antony Madura, Dyane Dustman Feb 04, 2024  5:23 PM) The patient's brother-in-law confirmed this as one of the patient's "active" meds, but he (the patient, himself) could not provide a last-taken time.  furosemide (LASIX) 20 MG tablet [id number] No Take 1 tablet (20 mg total) by mouth daily. Alberteen Sam, MD Not Taking Active   lisinopril (ZESTRIL) 20 MG tablet [id number] Yes Take 1 tablet (20 mg total) by mouth daily. Alberteen Sam, MD Taking Active   omeprazole (PRILOSEC) 40 MG capsule [id number] Yes Take 1 capsule (40 mg total) by mouth daily. Hoy Register, MD Taking Active   polyethylene glycol (MIRALAX) 17 g packet [id number] No Take 17 g by mouth daily. Rondel Baton, MD Not Taking Active   potassium chloride SA (KLOR-CON M) 20 MEQ tablet [id number] Yes Take 1 tablet (20 mEq total) by mouth daily. Alberteen Sam, MD Taking Active   sildenafil (VIAGRA) 50 MG tablet [id number] No TAKE 1 TABLET BY MOUTH ONCE DAILY AS NEEDED FOR ERECTILE DYSFUNCTION AT  LEAST  24  HOURS  BETWEEN  DOSES Hoy Register, MD Not Taking Active             Home Care and Equipment/Supplies: Were Home Health Services Ordered?: No Any new equipment or medical supplies ordered?: No  Functional Questionnaire: Called ended before this was reviewed.     Follow up appointments reviewed: PCP Follow-up appointment confirmed?:  (Pending, RN CM called PCP and trying to get him see next week either 2/29 or 02/15/24 per Cat at office scheduling.) Specialist Hospital Follow-up appointment confirmed?: Yes Date of Specialist follow-up appointment?: 02/13/24 Follow-Up Specialty Provider:: Dr Chestine Spore Do you need transportation to your follow-up appointment?: No (Patient uses [facility] but was unable to reach today.) Do you understand care options if your condition(s) worsen?: Yes-patient verbalized understanding  A follow up call is needed for  confirmation of understanding of new medications for blood pressure changed on discharge.   Gabriel Cirri MSN, RN RN Case Sales executive Health  [facility] Office Hours Wed/Thur  8:00 am-6:00 pm Direct Dial: [phone] Main Phone [phone]  Fax: [phone] [email or web address]

## 2024-02-08 NOTE — Telephone Encounter (Signed)
Call patient to schedule a VV in 1 week (2/20, 2/21).  Left message on voicemail to return call.

## 2024-02-08 NOTE — Telephone Encounter (Signed)
Copied from CRM [id number]. Topic: Appointments - Scheduling Inquiry for Clinic >> Feb 08, 2024  2:01 PM Payton Doughty wrote: Reason for CRM: Britta Mccreedy case manager calling to ask the pt be seen by 2/19 or 2/20.  Pt needs follow up lab work/ post hosp fup.  Discharge summary routed if this can be done today, please message Britta Mccreedy through [facility], or call her at [phone].  please call pt to let him know

## 2024-02-13 ENCOUNTER — Encounter: Payer: Self-pay | Admitting: Vascular Surgery

## 2024-02-13 ENCOUNTER — Ambulatory Visit (INDEPENDENT_AMBULATORY_CARE_PROVIDER_SITE_OTHER): Payer: Medicare (Managed Care) | Admitting: Vascular Surgery

## 2024-02-13 ENCOUNTER — Ambulatory Visit (HOSPITAL_COMMUNITY)
Admission: RE | Admit: 2024-02-13 | Discharge: 2024-02-13 | Disposition: A | Discharge status: Home / Self Care | Payer: Medicare (Managed Care) | Source: Ambulatory Visit | Attending: Vascular Surgery | Admitting: Vascular Surgery

## 2024-02-13 ENCOUNTER — Ambulatory Visit (HOSPITAL_COMMUNITY): Payer: Medicare (Managed Care)

## 2024-02-13 VITALS — BP 143/91 | HR 68 | Temp 97.9°F | Resp 20 | Ht 68.0 in | Wt 129.8 lb

## 2024-02-13 DIAGNOSIS — I70219 Atherosclerosis of native arteries of extremities with intermittent claudication, unspecified extremity: Secondary | ICD-10-CM

## 2024-02-13 DIAGNOSIS — I70213 Atherosclerosis of native arteries of extremities with intermittent claudication, bilateral legs: Secondary | ICD-10-CM

## 2024-02-13 LAB — VAS US ABI WITH/WO TBI
Left ABI: 0.58
Right ABI: 0.73

## 2024-02-13 MED ORDER — CILOSTAZOL 100 MG PO TABS: 100.0000 mg | ORAL_TABLET | Freq: Two times a day (BID) | ORAL | 11 refills | Status: DC

## 2024-02-13 NOTE — Progress Notes (Signed)
Patient name: Martin Mason MRN: [id number] DOB: 1956-03-17 Sex: male  REASON FOR CONSULT: 6 month follow-up, lower extremity claudication    HPI: [place] Chelf is a 68 y.o. male, with history of tobacco abuse and hypertension that presents for 13-month follow-up follow-up of his lower extremity claudication.  Previously was supposed to undergo angiogram on 07/06/2023 after evaluation by our PA but this was canceled due to severe hypokalemia and acute on chronic anemia noted on his labs day of scheduled procedure.    Today states he is walking up to 1.5 miles without stopping.  Then gets cramping in both calves.  Still smoking about 3 cigarettes a day.  No new concerns today.  Past Medical History:  Diagnosis Date   Asthma    childhood- young child to age 44   Hypertension    Peripheral vascular disease (HCC)     Past Surgical History:  Procedure Laterality Date   COLONOSCOPY     CYST EXCISION     throat   TOOTH EXTRACTION N/A 11/06/2020   Procedure: DENTAL RESTORATION/EXTRACTIONS;  Surgeon: Ocie Doyne, DDS;  Location: [facility] OR;  Service: Oral Surgery;  Laterality: N/A;    Family History  Problem Relation Age of Onset   Hypertension Mother    Cancer Mother    Hypertension Father    Diabetes Sister     SOCIAL HISTORY: Social History   Socioeconomic History   Marital status: Divorced    Spouse name: Not on file   Number of children: Not on file   Years of education: Not on file   Highest education level: Not on file  Occupational History   Not on file  Tobacco Use   Smoking status: Every Day    Current packs/day: 0.12    Types: Cigarettes   Smokeless tobacco: Never   Tobacco comments:    3 daily  Vaping Use   Vaping status: Never Used  Substance and Sexual Activity   Alcohol use: No    Comment: quit drinking 7 years ago after 30 years of drinking   Drug use: Yes    Types: Marijuana    Comment: last time early November 2021   Sexual activity: Not on file   Other Topics Concern   Not on file  Social History Narrative   Not on file   Social Drivers of Health   Financial Resource Strain: Low Risk  (02/06/2023)   Overall Financial Resource Strain (CARDIA)    Difficulty of Paying Living Expenses: Not hard at all  Food Insecurity: No Food Insecurity (02/03/2024)   Hunger Vital Sign    Worried About Running Out of Food in the Last Year: Never true    Ran Out of Food in the Last Year: Never true  Transportation Needs: No Transportation Needs (02/03/2024)   PRAPARE - Administrator, Civil Service (Medical): No    Lack of Transportation (Non-Medical): No  Physical Activity: Sufficiently Active (02/06/2023)   Exercise Vital Sign    Days of Exercise per Week: 7 days    Minutes of Exercise per Session: 30 min  Stress: No Stress Concern Present (02/06/2023)   [employer] of Occupational Health - Occupational Stress Questionnaire    Feeling of Stress : Not at all  Social Connections: Moderately Isolated (02/03/2024)   Social Connection and Isolation Panel [NHANES]    Frequency of Communication with Friends and Family: Three times a week    Frequency of Social Gatherings with Friends and Family:  Three times a week    Attends Religious Services: 1 to 4 times per year    Active Member of Clubs or Organizations: No    Attends Banker Meetings: Never    Marital Status: Divorced  Catering manager Violence: Not At Risk (02/03/2024)   Humiliation, Afraid, Rape, and Kick questionnaire    Fear of Current or Ex-Partner: No    Emotionally Abused: No    Physically Abused: No    Sexually Abused: No    No Known Allergies  Current Outpatient Medications  Medication Sig Dispense Refill   amLODipine (NORVASC) 10 MG tablet Take 1 tablet (10 mg total) by mouth daily. 90 tablet 1   aspirin EC 81 MG tablet Take 1 tablet (81 mg total) by mouth daily. Swallow whole. 30 tablet 12   atorvastatin (LIPITOR) 20 MG tablet Take 1 tablet (20 mg  total) by mouth daily. 90 tablet 1   cetirizine (ZYRTEC) 10 MG tablet Take 10 mg by mouth daily.     cholecalciferol (CHOLECALCIFEROL) 25 MCG tablet Take 1 tablet (1,000 Units total) by mouth daily.     ferrous sulfate 325 (65 FE) MG tablet Take 1 tablet (325 mg total) by mouth daily. 30 tablet 0   fluticasone (FLONASE) 50 MCG/ACT nasal spray Use 2 spray(s) in each nostril once daily (Patient taking differently: Place 2 sprays into both nostrils daily.) 48 g 0   furosemide (LASIX) 20 MG tablet Take 1 tablet (20 mg total) by mouth daily. 30 tablet 0   lisinopril (ZESTRIL) 20 MG tablet Take 1 tablet (20 mg total) by mouth daily. 30 tablet 0   omeprazole (PRILOSEC) 40 MG capsule Take 1 capsule (40 mg total) by mouth daily. 30 capsule 3   potassium chloride SA (KLOR-CON M) 20 MEQ tablet Take 1 tablet (20 mEq total) by mouth daily. 30 tablet 0   EQ ALLERGY RELIEF, CETIRIZINE, 10 MG tablet Take 1 tablet by mouth once daily (Patient not taking: Reported on 02/03/2024) 90 tablet 0   polyethylene glycol (MIRALAX) 17 g packet Take 17 g by mouth daily. (Patient not taking: Reported on 02/08/2024) 30 each 0   sildenafil (VIAGRA) 50 MG tablet TAKE 1 TABLET BY MOUTH ONCE DAILY AS NEEDED FOR ERECTILE DYSFUNCTION AT  LEAST  24  HOURS  BETWEEN  DOSES (Patient not taking: No sig reported) 10 tablet 0   No current facility-administered medications for this visit.    REVIEW OF SYSTEMS:  [X]  denotes positive finding, [ ]  denotes negative finding Cardiac  Comments:  Chest pain or chest pressure:    Shortness of breath upon exertion:    Short of breath when lying flat:    Irregular heart rhythm:        Vascular    Pain in calf, thigh, or hip brought on by ambulation: x   Pain in feet at night that wakes you up from your sleep:     Blood clot in your veins:    Leg swelling:         Pulmonary    Oxygen at home:    Productive cough:     Wheezing:         Neurologic    Sudden weakness in arms or legs:      Sudden numbness in arms or legs:     Sudden onset of difficulty speaking or slurred speech:    Temporary loss of vision in one eye:     Problems with dizziness:  Gastrointestinal    Blood in stool:     Vomited blood:         Genitourinary    Burning when urinating:     Blood in urine:        Psychiatric    Major depression:         Hematologic    Bleeding problems:    Problems with blood clotting too easily:        Skin    Rashes or ulcers:        Constitutional    Fever or chills:      PHYSICAL EXAM: Vitals:   02/13/24 1440  BP: (!) 143/91  Pulse: 68  Resp: 20  Temp: 97.9 F (36.6 C)  TempSrc: Temporal  SpO2: 94%  Weight: 129 lb 12.8 oz (58.9 kg)  Height: 5\' 8"  (1.727 m)    GENERAL: The patient is a well-nourished male, in no acute distress. The vital signs are documented above. CARDIAC: There is a regular rate and rhythm.  VASCULAR:  Bilateral femoral pulses palpable No palpable pedal pulses No lower extremity tissue loss PULMONARY: No respiratory distress. ABDOMEN: Soft and non-tender. MUSCULOSKELETAL: There are no major deformities or cyanosis. NEUROLOGIC: No focal weakness or paresthesias are detected. SKIN: There are no ulcers or rashes noted. PSYCHIATRIC: The patient has a normal affect.  DATA:   ABIs today 0.73 right and 0.58 left (previously on 06/27/23 were 0.79 on the right monophasic and 0.43 on the left monophasic)  Assessment/Plan:   68 y.o. male, with history of tobacco abuse and hypertension that presents for 47-month follow-up of his lower extremity claudication.  Again is doing well with medical therapy and states he is walking up to 1.5 miles without stopping.  I discussed in the absence of disabling claudication symptoms we recommend medical management.  I have put him on Pletal and talked about continued walking therapies at least 30 minutes a day 3 times a week.  I have also talked about smoking cessation.  I will see him in 1  year with repeat ABIs.  I discussed call me with any worsening symptoms.   [name], MD [facility] Office: [phone]

## 2024-02-22 ENCOUNTER — Ambulatory Visit (HOSPITAL_COMMUNITY): Payer: Medicare (Managed Care)

## 2024-03-04 ENCOUNTER — Ambulatory Visit (HOSPITAL_COMMUNITY)
Admission: RE | Admit: 2024-03-04 | Discharge: 2024-03-04 | Disposition: A | Discharge status: Home / Self Care | Payer: Medicare (Managed Care) | Source: Ambulatory Visit | Attending: Family Medicine | Admitting: Family Medicine

## 2024-03-04 ENCOUNTER — Other Ambulatory Visit: Payer: Self-pay | Admitting: Family Medicine

## 2024-03-04 DIAGNOSIS — I5040 Unspecified combined systolic (congestive) and diastolic (congestive) heart failure: Secondary | ICD-10-CM

## 2024-03-04 DIAGNOSIS — R600 Localized edema: Secondary | ICD-10-CM | POA: Diagnosis not present

## 2024-03-04 DIAGNOSIS — R0609 Other forms of dyspnea: Secondary | ICD-10-CM | POA: Diagnosis not present

## 2024-03-04 LAB — ECHOCARDIOGRAM COMPLETE
Area-P 1/2: 2.71 cm2
Calc EF: 39.6 %
S' Lateral: 3.5 cm
Single Plane A2C EF: 39.7 %
Single Plane A4C EF: 38 %

## 2024-03-08 ENCOUNTER — Other Ambulatory Visit: Payer: Self-pay | Admitting: Family Medicine

## 2024-03-08 DIAGNOSIS — I10 Essential (primary) hypertension: Secondary | ICD-10-CM

## 2024-03-08 DIAGNOSIS — R430 Anosmia: Secondary | ICD-10-CM

## 2024-03-08 DIAGNOSIS — I739 Peripheral vascular disease, unspecified: Secondary | ICD-10-CM

## 2024-03-08 NOTE — Telephone Encounter (Signed)
 Requested Prescriptions  Pending Prescriptions Disp Refills   EQ ALLERGY RELIEF, CETIRIZINE, 10 MG tablet [Pharmacy Med Name: EQ Allergy Relief (Cetirizine) 10 MG Oral Tablet] 90 tablet 0    Sig: Take 1 tablet by mouth once daily     Ear, Nose, and Throat:  Antihistamines 2 Passed - 03/08/2024  4:31 PM      Passed - Cr in normal range and within 360 days    Creat  Date Value Ref Range Status  03/01/2017 1.24 0.70 - 1.25 mg/dL Final    Comment:      For patients > or = 70 years of age: The upper reference limit for Creatinine is approximately 13% higher for people identified as African-American.      Creatinine, Ser  Date Value Ref Range Status  02/06/2024 1.14 0.61 - 1.24 mg/dL Final         Passed - Valid encounter within last 12 months    Recent Outpatient Visits           1 month ago PAD (peripheral artery disease) (HCC)   [facility] Dept Of [place]. [facility] Hoy Register, MD   11 months ago Medication management   [facility] [facility] [place] - A Dept Of [place]. [facility] Drucilla Chalet, RPH-CPP   11 months ago Essential hypertension   [facility] [place] - A Dept Of [place]. [facility] Hoy Register, MD   1 year ago Tobacco dependence   [facility] [place] - A Dept Of [place]. [facility] Hoy Register, MD   1 year ago Essential hypertension   [facility] [place] - A Dept Of [place]. [facility] Hoy Register, MD       Future Appointments             In 4 days Allyson Sabal, Delton See, MD [facility]   In 4 months Hoy Register, MD [facility] - A Dept Of Eligha Bridegroom. [facility]             amLODipine (NORVASC) 10 MG tablet [Pharmacy Med Name: amLODIPine Besylate 10 MG Oral Tablet] 90 tablet 0    Sig: Take 1 tablet by mouth once daily     Cardiovascular: Calcium  Channel Blockers 2 Failed - 03/08/2024  4:31 PM      Failed - Last BP in normal range    BP Readings from Last 1 Encounters:  02/13/24 (!) 143/91         Passed - Last Heart Rate in normal range    Pulse Readings from Last 1 Encounters:  02/13/24 68         Passed - Valid encounter within last 6 months    Recent Outpatient Visits           1 month ago PAD (peripheral artery disease) (HCC)   [facility] - [facility] Dept Of [place]. [facility] Hoy Register, MD   11 months ago Medication management   [facility] [employer] - A Dept Of [place]. [facility] Drucilla Chalet, RPH-CPP   11 months ago Essential hypertension   [facility] [place] - A Dept Of [place]. [facility] Hoy Register, MD   1 year ago Tobacco dependence   [facility] [facility] - A Dept Of [place]. [facility] Hoy Register, MD   1 year ago Essential hypertension   [facility] [facility] [place] - A Dept Of [place]. [facility] Hoy Register, MD       Future Appointments             In 4 days Allyson Sabal, Delton See, MD [facility]   In 4 months Hoy Register, MD [facility] [address] [place] - A Dept Of Eligha Bridegroom. [facility]             atorvastatin (LIPITOR) 20 MG tablet [Pharmacy Med Name: Atorvastatin Calcium 20 MG Oral Tablet] 90 tablet 0    Sig: Take 1 tablet by mouth once daily     Cardiovascular:  Antilipid - Statins Failed - 03/08/2024  4:31 PM      Failed - Lipid Panel in normal range within the last 12 months    Cholesterol, Total  Date Value Ref Range Status  07/03/2019 156 100 - 199 mg/dL Final   Cholesterol  Date Value Ref Range Status  02/03/2024 85 0 - 200 mg/dL Final   LDL Calculated  Date Value Ref Range Status  07/03/2019 89 0 - 99 mg/dL Final   LDL Cholesterol  Date Value Ref Range Status  02/03/2024 32 0 - 99 mg/dL Final     Comment:           Total Cholesterol/HDL:CHD Risk Coronary Heart Disease Risk Table                     Men   Women  1/2 Average Risk   3.4   3.3  Average Risk       5.0   4.4  2 X Average Risk   9.6   7.1  3 X Average Risk  23.4   11.0        Use the calculated Patient Ratio above and the CHD Risk Table to determine the patient's CHD Risk.        ATP III CLASSIFICATION (LDL):  <100     mg/dL   Optimal  213-086  mg/dL   Near or Above                    Optimal  130-159  mg/dL   Borderline  578-469  mg/dL   High  >629     mg/dL   Very High Performed at [facility], [address] [place], [place] [address]    HDL  Date Value Ref Range Status  02/03/2024 43 >40 mg/dL Final  32/44/0102 54 >72 mg/dL Final   Triglycerides  Date Value Ref Range Status  02/03/2024 50 <150 mg/dL Final         Passed - Patient is not pregnant      Passed - Valid encounter within last 12 months    Recent Outpatient Visits           1 month ago PAD (peripheral artery disease) (HCC)   [facility] Of [place]. [facility] Hoy Register, MD   11 months ago Medication management   [facility] [facility] [place] - A Dept Of [place]. [facility] Drucilla Chalet, RPH-CPP   11 months ago Essential hypertension   [facility] [facility] [place] - A Dept Of . [facility],  Odette Horns, MD   1 year ago Tobacco dependence   [place] [facility] [place] - A Dept Of [place]. [facility] Hoy Register, MD   1 year ago Essential hypertension   [facility] [place] - A Dept Of . [facility] Hoy Register, MD       Future Appointments             In 4 days Allyson Sabal, Delton See, MD [facility] at [facility]   In 4 months Hoy Register, MD [facility] - [facility] Dept Of Eligha Bridegroom. [facility]              lisinopril-hydrochlorothiazide (ZESTORETIC) 20-25 MG tablet [Pharmacy Med Name: Lisinopril-hydroCHLOROthiazide 20-25 MG Oral Tablet] 90 tablet 0    Sig: Take 1 tablet by mouth once daily     Cardiovascular:  ACEI + Diuretic Combos Failed - 03/08/2024  4:31 PM      Failed - Na in normal range and within 180 days    Sodium  Date Value Ref Range Status  02/06/2024 134 (L) 135 - 145 mmol/L Final  01/17/2024 139 134 - 144 mmol/L Final         Failed - Last BP in normal range    BP Readings from Last 1 Encounters:  02/13/24 (!) 143/91         Passed - K in normal range and within 180 days    Potassium  Date Value Ref Range Status  02/06/2024 4.1 3.5 - 5.1 mmol/L Final         Passed - Cr in normal range and within 180 days    Creat  Date Value Ref Range Status  03/01/2017 1.24 0.70 - 1.25 mg/dL Final    Comment:      For patients > or = 70 years of age: The upper reference limit for Creatinine is approximately 13% higher for people identified as African-American.      Creatinine, Ser  Date Value Ref Range Status  02/06/2024 1.14 0.61 - 1.24 mg/dL Final         Passed - eGFR is 30 or above and within 180 days    GFR, Est African American  Date Value Ref Range Status  03/01/2017 72 >=60 mL/min Final   GFR calc Af Amer  Date Value Ref Range Status  11/12/2020 80 >59 mL/min/1.73 Final    Comment:    **In accordance with recommendations from the NKF-ASN Task force,**   [facility] is in the process of updating its eGFR calculation to the   2021 CKD-EPI creatinine equation that estimates kidney function   without a race variable.    GFR, Est Non African American  Date Value Ref Range Status  03/01/2017 62 >=60 mL/min Final   GFR, Estimated  Date Value Ref Range Status  02/06/2024 >60 >60 mL/min Final    Comment:    (NOTE) Calculated using the CKD-EPI Creatinine Equation (2021)    eGFR  Date Value Ref Range Status  01/17/2024 65 >59 mL/min/1.73 Final          Passed - Patient is not pregnant      Passed - Valid encounter within last 6 months    Recent Outpatient Visits           1 month ago PAD (peripheral artery disease) (HCC)   [facility] [facility] Dept Of . [facility], [place],  MD   11 months ago Medication management   [facility] [facility] [place] - A Dept Of [place]. [facility] Drucilla Chalet, RPH-CPP   11 months ago Essential hypertension   [place] [facility] [place] - [facility] Dept Of [place]. [facility] Hoy Register, MD   1 year ago Tobacco dependence   [place] [facility] [place] - A Dept Of [place]. [facility] Hoy Register, MD   1 year ago Essential hypertension   [facility] [facility] [place] - A Dept Of [place]. [facility] Hoy Register, MD       Future Appointments             In 4 days Allyson Sabal, Delton See, MD [facility]   In 4 months Hoy Register, MD [facility] [place] - A Dept Of Eligha Bridegroom. [facility]

## 2024-03-12 ENCOUNTER — Other Ambulatory Visit (HOSPITAL_COMMUNITY): Payer: Medicare (Managed Care)

## 2024-03-12 ENCOUNTER — Ambulatory Visit: Payer: Medicare (Managed Care) | Attending: Cardiovascular Disease | Admitting: Cardiovascular Disease

## 2024-03-12 ENCOUNTER — Encounter: Payer: Self-pay | Admitting: Cardiovascular Disease

## 2024-03-12 ENCOUNTER — Other Ambulatory Visit: Payer: Self-pay | Admitting: Family Medicine

## 2024-03-12 VITALS — BP 126/68 | HR 72 | Ht 68.5 in | Wt 124.0 lb

## 2024-03-12 DIAGNOSIS — I5020 Unspecified systolic (congestive) heart failure: Secondary | ICD-10-CM

## 2024-03-12 DIAGNOSIS — R0609 Other forms of dyspnea: Secondary | ICD-10-CM | POA: Diagnosis not present

## 2024-03-12 DIAGNOSIS — I70213 Atherosclerosis of native arteries of extremities with intermittent claudication, bilateral legs: Secondary | ICD-10-CM

## 2024-03-12 DIAGNOSIS — Z720 Tobacco use: Secondary | ICD-10-CM

## 2024-03-12 DIAGNOSIS — I10 Essential (primary) hypertension: Secondary | ICD-10-CM | POA: Diagnosis not present

## 2024-03-12 DIAGNOSIS — I739 Peripheral vascular disease, unspecified: Secondary | ICD-10-CM

## 2024-03-12 DIAGNOSIS — I70219 Atherosclerosis of native arteries of extremities with intermittent claudication, unspecified extremity: Secondary | ICD-10-CM

## 2024-03-12 DIAGNOSIS — E785 Hyperlipidemia, unspecified: Secondary | ICD-10-CM | POA: Insufficient documentation

## 2024-03-12 DIAGNOSIS — R430 Anosmia: Secondary | ICD-10-CM

## 2024-03-12 NOTE — Assessment & Plan Note (Signed)
 History of essential hypertension her blood pressure measured today at 126/68.  He is on amlodipine and lisinopril.

## 2024-03-12 NOTE — Telephone Encounter (Signed)
 Copied from CRM [id number]. Topic: Clinical - Medication Refill >> Mar 12, 2024 11:53 AM Priscille Loveless wrote: Most Recent Primary Care Visit:  Provider: Hoy Register  Department: [facility]  Visit Type: OFFICE VISIT  Date: 01/17/2024  Medication:  potassium chloride SA (KLOR-CON M) 20 MEQ tablet  lisinopril (ZESTRIL) 20 MG tablet aspirin EC 81 MG tablet  lisinopril (ZESTRIL) 20 MG tablet amLODipine (NORVASC) 10 MG tablet atorvastatin (LIPITOR) 20 MG tablet cetirizine (ZYRTEC) 10 MG tablet furosemide (LASIX) 20 MG tablet fluticasone (FLONASE) 50 MCG/ACT nasal spray   Has the patient contacted their pharmacy? No  Is this the correct pharmacy for this prescription? Yes  This is the patient's preferred pharmacy:  [id number] - [place] ([place] - [address] [place]) [place] [address] Phone: [phone] Fax: [phone]   Has the prescription been filled recently? Yes  Is the patient out of the medication? Yes  Has the patient been seen for an appointment in the last year OR does the patient have an upcoming appointment? Yes  Can we respond through MyChart? No  Agent: Please be advised that Rx refills may take up to 3 business days. We ask that you follow-up with your pharmacy.

## 2024-03-12 NOTE — Assessment & Plan Note (Signed)
 Patient was seen in the ER a month ago with leg swelling.  His BNP was 1500.  He had a 2D echo performed 02/04/2024 that revealed an EF of 55% with grade 1 diastolic dysfunction and without valvular abnormalities.  A repeat 2D echocardiogram performed 03/04/2024 showed an EF of 45 to 50% with grade 1 diastolic dysfunction.  He does have 1+ ankle and lower pretibial edema.  He is on furosemide 20 mg a day.  He denies chest pain or shortness of breath.

## 2024-03-12 NOTE — Assessment & Plan Note (Signed)
 50-pack-year tobacco abuse currently smoking 4 cigarettes a day.

## 2024-03-12 NOTE — Assessment & Plan Note (Signed)
 History of hyperlipidemia on statin therapy with lipid profile performed 02/03/2024 revealing total cholesterol 85, LDL 32 and HDL 43.

## 2024-03-12 NOTE — Patient Instructions (Addendum)
 Medication Instructions:  Your physician recommends that you continue on your current medications as directed. Please refer to the Current Medication list given to you today.  *If you need a refill on your cardiac medications before your next appointment, please call your pharmacy*   Testing/Procedures: Dr. Allyson Sabal has ordered a CT coronary calcium score.   Test locations:  [facility]  [facility]  This is $99 out of pocket.   Coronary CalciumScan A coronary calcium scan is an imaging test used to look for deposits of calcium and other fatty materials (plaques) in the inner lining of the blood vessels of the heart (coronary arteries). These deposits of calcium and plaques can partly clog and narrow the coronary arteries without producing any symptoms or warning signs. This puts a person at risk for a heart attack. This test can detect these deposits before symptoms develop. Tell a health care provider about: Any allergies you have. All medicines you are taking, including vitamins, herbs, eye drops, creams, and over-the-counter medicines. Any problems you or family members have had with anesthetic medicines. Any blood disorders you have. Any surgeries you have had. Any medical conditions you have. Whether you are pregnant or may be pregnant. What are the risks? Generally, this is a safe procedure. However, problems may occur, including: Harm to a pregnant woman and her unborn baby. This test involves the use of radiation. Radiation exposure can be dangerous to a pregnant woman and her unborn baby. If you are pregnant, you generally should not have this procedure done. Slight increase in the risk of cancer. This is because of the radiation involved in the test. What happens before the procedure? No preparation is needed for this procedure. What happens during the procedure? You will undress and  remove any jewelry around your neck or chest. You will put on a hospital gown. Sticky electrodes will be placed on your chest. The electrodes will be connected to an electrocardiogram (ECG) machine to record a tracing of the electrical activity of your heart. A CT scanner will take pictures of your heart. During this time, you will be asked to lie still and hold your breath for 2-3 seconds while a picture of your heart is being taken. The procedure may vary among health care providers and hospitals. What happens after the procedure? You can get dressed. You can return to your normal activities. It is up to you to get the results of your test. Ask your health care provider, or the department that is doing the test, when your results will be ready. Summary A coronary calcium scan is an imaging test used to look for deposits of calcium and other fatty materials (plaques) in the inner lining of the blood vessels of the heart (coronary arteries). Generally, this is a safe procedure. Tell your health care provider if you are pregnant or may be pregnant. No preparation is needed for this procedure. A CT scanner will take pictures of your heart. You can return to your normal activities after the scan is done. This information is not intended to replace advice given to you by your health care provider. Make sure you discuss any questions you have with your health care provider. Document Released: 06/09/2008 Document Revised: 10/31/2016 Document Reviewed: 10/31/2016 Elsevier Interactive Patient Education  2017 [employer].    Follow-Up: At [facility], you and your health needs are our priority.  As part of our continuing  mission to provide you with exceptional heart care, we have created designated Provider Care Teams.  These Care Teams include your primary Cardiologist (physician) and Advanced Practice Providers (APPs -  Physician Assistants and Nurse Practitioners) who all work together to  provide you with the care you need, when you need it.  We recommend signing up for the patient portal called "MyChart".  Sign up information is provided on this After Visit Summary.  MyChart is used to connect with patients for Virtual Visits (Telemedicine).  Patients are able to view lab/test results, encounter notes, upcoming appointments, etc.  Non-urgent messages can be sent to your provider as well.   To learn more about what you can do with MyChart, go to [email or web address].    Your next appointment:   6 month(s)  Provider:   Nanetta Batty, MD     Other Instructions   1st Floor: - Lobby - Registration  - Pharmacy  - Lab - Cafe  2nd Floor: - PV Lab - Diagnostic Testing (echo, CT, nuclear med)  3rd Floor: - Vacant  4th Floor: - TCTS (cardiothoracic surgery) - AFib Clinic - Structural Heart Clinic - Vascular Surgery  - Vascular Ultrasound  5th Floor: - [facility] Card[facility] (general and EP) - Clinical Pharmacy for coumadin, hypertension, lipid, weight-loss medications, and med management appointments    Valet parking services will be available as well.

## 2024-03-12 NOTE — Progress Notes (Signed)
 03/12/2024 Martin Mason   [birth year]  [id number]  Primary Physician Hoy Register, MD Primary Cardiologist: Runell Gess MD Nicholes Calam[place], [place]  HPI:  Martin Mason is a 68 y.o. thin-appearing married single African-American male father of 1 daughter, grandfather of 3 grandsons who was referred by Dr. Alvis Lemmings, his PCP, for evaluation of heart failure.  He is currently retired but grew tobacco when he was working.  His risk factors include 50 pack years of tobacco abuse currently smoking 4 cigarettes a day.  He has treated hypertension hyperlipidemia.  There is no family history for heart disease.  Is never had a heart attack or stroke.  He had 1 episode of chest pain which he has attributed to heartburn and really has minimal shortness of breath.  He does play basketball with his grandchildren.  He had a 2D echo performed in February that showed an EF of 55% and again a month later showed an EF of 45 to 50% with grade 1 diastolic dysfunction.  He was seen in the ER in February of this year with some lower extremity edema.  BNP was 1500.  He currently is on low-dose furosemide.  He is seeing Dr. Chestine Spore for vascular disease with Doppler studies performed 02/13/2024 revealing a right ABI of 0.73 and a left of 0.58.  He is currently on Pletal and denies claudication.   Current Meds  Medication Sig   amLODipine (NORVASC) 10 MG tablet Take 1 tablet (10 mg total) by mouth daily.   aspirin EC 81 MG tablet Take 1 tablet (81 mg total) by mouth daily. Swallow whole.   atorvastatin (LIPITOR) 20 MG tablet Take 1 tablet (20 mg total) by mouth daily.   cetirizine (ZYRTEC) 10 MG tablet Take 10 mg by mouth daily.   cholecalciferol (CHOLECALCIFEROL) 25 MCG tablet Take 1 tablet (1,000 Units total) by mouth daily.   cilostazol (PLETAL) 100 MG tablet Take 1 tablet (100 mg total) by mouth 2 (two) times daily before a meal.   EQ ALLERGY RELIEF, CETIRIZINE, 10 MG tablet Take 1 tablet by mouth once daily    ferrous sulfate 325 (65 FE) MG tablet Take 1 tablet (325 mg total) by mouth daily.   fluticasone (FLONASE) 50 MCG/ACT nasal spray Use 2 spray(s) in each nostril once daily (Patient taking differently: Place 2 sprays into both nostrils daily.)   furosemide (LASIX) 20 MG tablet Take 1 tablet (20 mg total) by mouth daily.   lisinopril (ZESTRIL) 20 MG tablet Take 1 tablet (20 mg total) by mouth daily.   omeprazole (PRILOSEC) 40 MG capsule Take 1 capsule (40 mg total) by mouth daily.   polyethylene glycol (MIRALAX) 17 g packet Take 17 g by mouth daily.   potassium chloride SA (KLOR-CON M) 20 MEQ tablet Take 1 tablet (20 mEq total) by mouth daily.   sildenafil (VIAGRA) 50 MG tablet TAKE 1 TABLET BY MOUTH ONCE DAILY AS NEEDED FOR ERECTILE DYSFUNCTION AT  LEAST  24  HOURS  BETWEEN  DOSES     No Known Allergies  Social History   Socioeconomic History   Marital status: Divorced    Spouse name: Not on file   Number of children: Not on file   Years of education: Not on file   Highest education level: Not on file  Occupational History   Not on file  Tobacco Use   Smoking status: Every Day    Current packs/day: 0.12    Types: Cigarettes   Smokeless  tobacco: Never   Tobacco comments:    3 daily  Vaping Use   Vaping status: Never Used  Substance and Sexual Activity   Alcohol use: No    Comment: quit drinking 7 years ago after 30 years of drinking   Drug use: Yes    Types: Marijuana    Comment: last time early November 2021   Sexual activity: Not on file  Other Topics Concern   Not on file  Social History Narrative   Not on file   Social Drivers of Health   Financial Resource Strain: Low Risk  (02/06/2023)   Overall Financial Resource Strain (CARDIA)    Difficulty of Paying Living Expenses: Not hard at all  Food Insecurity: No Food Insecurity (02/03/2024)   Hunger Vital Sign    Worried About Running Out of Food in the Last Year: Never true    Ran Out of Food in the Last Year: Never  true  Transportation Needs: No Transportation Needs (02/03/2024)   PRAPARE - Administrator, Civil Service (Medical): No    Lack of Transportation (Non-Medical): No  Physical Activity: Sufficiently Active (02/06/2023)   Exercise Vital Sign    Days of Exercise per Week: 7 days    Minutes of Exercise per Session: 30 min  Stress: No Stress Concern Present (02/06/2023)   [employer] of Occupational Health - Occupational Stress Questionnaire    Feeling of Stress : Not at all  Social Connections: Moderately Isolated (02/03/2024)   Social Connection and Isolation Panel [NHANES]    Frequency of Communication with Friends and Family: Three times a week    Frequency of Social Gatherings with Friends and Family: Three times a week    Attends Religious Services: 1 to 4 times per year    Active Member of Clubs or Organizations: No    Attends Banker Meetings: Never    Marital Status: Divorced  Catering manager Violence: Not At Risk (02/03/2024)   Humiliation, Afraid, Rape, and Kick questionnaire    Fear of Current or Ex-Partner: No    Emotionally Abused: No    Physically Abused: No    Sexually Abused: No     Review of Systems: General: negative for chills, fever, night sweats or weight changes.  Cardiovascular: negative for chest pain, dyspnea on exertion, edema, orthopnea, palpitations, paroxysmal nocturnal dyspnea or shortness of breath Dermatological: negative for rash Respiratory: negative for cough or wheezing Urologic: negative for hematuria Abdominal: negative for nausea, vomiting, diarrhea, bright red blood per rectum, melena, or hematemesis Neurologic: negative for visual changes, syncope, or dizziness All other systems reviewed and are otherwise negative except as noted above.    Blood pressure 126/68, pulse 72, height 5' 8.5" (1.74 m), weight 124 lb (56.2 kg), SpO2 93%.  General appearance: alert and no distress Neck: no adenopathy, no carotid bruit, no  JVD, supple, symmetrical, trachea midline, and thyroid not enlarged, symmetric, no tenderness/mass/nodules Lungs: clear to auscultation bilaterally Heart: regular rate and rhythm, S1, S2 normal, no murmur, click, rub or gallop Extremities: 1+ lower extremity edema Pulses: Diminished pedal pulses Skin: Skin color, texture, turgor normal. No rashes or lesions Neurologic: Grossly normal  EKG EKG Interpretation Date/Time:  Tuesday March 12 2024 10:10:30 EDT Ventricular Rate:  72 PR Interval:  204 QRS Duration:  84 QT Interval:  388 QTC Calculation: 424 R Axis:   -8  Text Interpretation: Normal sinus rhythm Possible Left atrial enlargement When compared with ECG of 03-Feb-2024 11:21, PREVIOUS ECG  IS PRESENT Confirmed by Nanetta Batty [phone]) on 03/12/2024 10:35:57 AM    ASSESSMENT AND PLAN:   Essential hypertension History of essential hypertension her blood pressure measured today at 126/68.  He is on amlodipine and lisinopril.  Tobacco abuse 50-pack-year tobacco abuse currently smoking 4 cigarettes a day.  Atherosclerotic PVD with intermittent claudication (HCC) History of PAD followed by Dr. Chestine Spore with Dopplers performed 02/03/2024 revealing a right ABI of 0.73 and left of 0.58 on Pletal.  He currently denies claudication.  Acute exacerbation of congestive heart failure (HCC) Patient was seen in the ER a month ago with leg swelling.  His BNP was 1500.  He had a 2D echo performed 02/04/2024 that revealed an EF of 55% with grade 1 diastolic dysfunction and without valvular abnormalities.  A repeat 2D echocardiogram performed 03/04/2024 showed an EF of 45 to 50% with grade 1 diastolic dysfunction.  He does have 1+ ankle and lower pretibial edema.  He is on furosemide 20 mg a day.  He denies chest pain or shortness of breath.  Hyperlipidemia History of hyperlipidemia on statin therapy with lipid profile performed 02/03/2024 revealing total cholesterol 85, LDL 32 and HDL 43.     Runell Gess MD FACP,FACC,FAHA, [facility] 03/12/2024 10:50 AM

## 2024-03-12 NOTE — Assessment & Plan Note (Signed)
 History of PAD followed by Dr. Chestine Spore with Dopplers performed 02/03/2024 revealing a right ABI of 0.73 and left of 0.58 on Pletal.  He currently denies claudication.

## 2024-03-13 MED ORDER — CETIRIZINE HCL 10 MG PO TABS: 10.0000 mg | ORAL_TABLET | Freq: Every day | ORAL | 1 refills | Status: DC

## 2024-03-13 MED ORDER — FUROSEMIDE 20 MG PO TABS: 20.0000 mg | ORAL_TABLET | Freq: Every day | ORAL | 0 refills | Status: DC

## 2024-03-13 MED ORDER — FLUTICASONE PROPIONATE 50 MCG/ACT NA SUSP: NASAL | 0 refills | Status: DC

## 2024-03-13 MED ORDER — LISINOPRIL 20 MG PO TABS: 20.0000 mg | ORAL_TABLET | Freq: Every day | ORAL | 0 refills | Status: DC

## 2024-03-13 MED ORDER — ASPIRIN 81 MG PO TBEC: 81.0000 mg | DELAYED_RELEASE_TABLET | Freq: Every day | ORAL | 12 refills | Status: DC

## 2024-03-13 NOTE — Telephone Encounter (Signed)
 Requested medication (s) are due for refill today: Yes  Requested medication (s) are on the active medication list: Yes  Last refill:  Varies   Future visit scheduled: Yes  Notes to clinic:  Unable to refill per protocol, last refill by another provider.      Requested Prescriptions  Pending Prescriptions Disp Refills   potassium chloride SA (KLOR-CON M) 20 MEQ tablet 30 tablet 0    Sig: Take 1 tablet (20 mEq total) by mouth daily.     Endocrinology:  Minerals - Potassium Supplementation Passed - 03/13/2024  4:02 PM      Passed - K in normal range and within 360 days    Potassium  Date Value Ref Range Status  02/06/2024 4.1 3.5 - 5.1 mmol/L Final         Passed - Cr in normal range and within 360 days    Creat  Date Value Ref Range Status  03/01/2017 1.24 0.70 - 1.25 mg/dL Final    Comment:      For patients > or = 70 years of age: The upper reference limit for Creatinine is approximately 13% higher for people identified as African-American.      Creatinine, Ser  Date Value Ref Range Status  02/06/2024 1.14 0.61 - 1.24 mg/dL Final         Passed - Valid encounter within last 12 months    Recent Outpatient Visits           1 month ago PAD (peripheral artery disease) (HCC)   [facility] - [facility] Dept Of [place]. [facility] Hoy Register, MD   11 months ago Medication management   [facility] [facility] [place] - A Dept Of [place]. [facility] Drucilla Chalet, RPH-CPP   12 months ago Essential hypertension   [facility] - A Dept Of [place]. [facility] Hoy Register, MD   1 year ago Tobacco dependence   [facility] - A Dept Of [place]. [facility] Hoy Register, MD   1 year ago Essential hypertension   [place] [facility] - A Dept Of [place]. [facility] Hoy Register, MD       Future Appointments             In 4 months  Hoy Register, MD [facility] - A Dept Of [place]. [facility]             lisinopril (ZESTRIL) 20 MG tablet 30 tablet 0    Sig: Take 1 tablet (20 mg total) by mouth daily.     Cardiovascular:  ACE Inhibitors Passed - 03/13/2024  4:02 PM      Passed - Cr in normal range and within 180 days    Creat  Date Value Ref Range Status  03/01/2017 1.24 0.70 - 1.25 mg/dL Final    Comment:      For patients > or = 70 years of age: The upper reference limit for Creatinine is approximately 13% higher for people identified as African-American.      Creatinine, Ser  Date Value Ref Range Status  02/06/2024 1.14 0.61 - 1.24 mg/dL Final         Passed - K in normal range and within 180 days    Potassium  Date Value Ref Range Status  02/06/2024 4.1 3.5 - 5.1 mmol/L Final  Passed - Patient is not pregnant      Passed - Last BP in normal range    BP Readings from Last 1 Encounters:  03/12/24 126/68         Passed - Valid encounter within last 6 months    Recent Outpatient Visits           1 month ago PAD (peripheral artery disease) (HCC)   [place] [facility] - A Dept Of [place]. [facility] Hoy Register, MD   11 months ago Medication management   [facility] [facility] [place] - A Dept Of [place]. [facility] Drucilla Chalet, RPH-CPP   12 months ago Essential hypertension   [place] [facility] - A Dept Of [place]. [facility] Hoy Register, MD   1 year ago Tobacco dependence   [facility] [facility] [place] - A Dept Of [place]. [facility] Hoy Register, MD   1 year ago Essential hypertension   [place] [facility] [place] - A Dept Of [place]. [facility] Hoy Register, MD       Future Appointments             In 4 months Hoy Register, MD [facility] - A Dept Of [place]. [facility]              aspirin EC 81 MG tablet 30 tablet 12    Sig: Take 1 tablet (81 mg total) by mouth daily. Swallow whole.     Analgesics:  NSAIDS - aspirin Passed - 03/13/2024  4:02 PM      Passed - Cr in normal range and within 360 days    Creat  Date Value Ref Range Status  03/01/2017 1.24 0.70 - 1.25 mg/dL Final    Comment:      For patients > or = 70 years of age: The upper reference limit for Creatinine is approximately 13% higher for people identified as African-American.      Creatinine, Ser  Date Value Ref Range Status  02/06/2024 1.14 0.61 - 1.24 mg/dL Final         Passed - eGFR is 10 or above and within 360 days    GFR, Est African American  Date Value Ref Range Status  03/01/2017 72 >=60 mL/min Final   GFR calc Af Amer  Date Value Ref Range Status  11/12/2020 80 >59 mL/min/1.73 Final    Comment:    **In accordance with recommendations from the NKF-ASN Task force,**   [facility] is in the process of updating its eGFR calculation to the   2021 CKD-EPI creatinine equation that estimates kidney function   without a race variable.    GFR, Est Non African American  Date Value Ref Range Status  03/01/2017 62 >=60 mL/min Final   GFR, Estimated  Date Value Ref Range Status  02/06/2024 >60 >60 mL/min Final    Comment:    (NOTE) Calculated using the CKD-EPI Creatinine Equation (2021)    eGFR  Date Value Ref Range Status  01/17/2024 65 >59 mL/min/1.73 Final         Passed - Patient is not pregnant      Passed - Valid encounter within last 12 months    Recent Outpatient Visits           1 month ago PAD (peripheral artery disease) (HCC)   [facility] -  A Dept Of [place]. [facility] Hoy Register, MD   11 months ago Medication management   [place] [facility] [place] - A Dept Of [place]. [facility] Drucilla Chalet, RPH-CPP   12 months ago Essential hypertension   [facility] [place] - A Dept Of [place]. [facility] Hoy Register, MD   1 year ago Tobacco dependence   [place] [facility] - A Dept Of [place]. [facility] Hoy Register, MD   1 year ago Essential hypertension   [facility] [place] [facility] [place] - A Dept Of [place]. [facility] Hoy Register, MD       Future Appointments             In 4 months Hoy Register, MD [facility] - A Dept Of [place]. [facility]             cetirizine (ZYRTEC) 10 MG tablet      Sig: Take 1 tablet (10 mg total) by mouth daily.     Ear, Nose, and Throat:  Antihistamines 2 Passed - 03/13/2024  4:02 PM      Passed - Cr in normal range and within 360 days    Creat  Date Value Ref Range Status  03/01/2017 1.24 0.70 - 1.25 mg/dL Final    Comment:      For patients > or = 70 years of age: The upper reference limit for Creatinine is approximately 13% higher for people identified as African-American.      Creatinine, Ser  Date Value Ref Range Status  02/06/2024 1.14 0.61 - 1.24 mg/dL Final         Passed - Valid encounter within last 12 months    Recent Outpatient Visits           1 month ago PAD (peripheral artery disease) (HCC)   [facility] [facility] - A Dept Of [place]. [facility] Hoy Register, MD   11 months ago Medication management   [facility] [facility] - A Dept Of [place]. [facility] Drucilla Chalet, RPH-CPP   12 months ago Essential hypertension   [place] [facility] [place] - A Dept Of [place]. [facility] Hoy Register, MD   1 year ago Tobacco dependence   [facility] [facility] - A Dept Of [place]. [facility] Hoy Register, MD   1 year ago Essential hypertension   [place] [facility] - A Dept Of [place]. [facility] Hoy Register, MD       Future Appointments             In 4 months Hoy Register, MD [facility] - A Dept Of [place]. [facility]             fluticasone [facility]) 50 MCG/ACT nasal spray 48 g 0    Sig: Use 2 spray(s) in each nostril once daily     Ear, Nose, and Throat: Nasal Preparations - Corticosteroids Passed - 03/13/2024  4:02 PM      Passed - Valid encounter within last 12 months    Recent Outpatient Visits           1 month ago PAD (peripheral artery disease) (HCC)   [place] [facility] - A Dept Of [place]  [facility] Hoy Register, MD   11 months ago Medication management   [place] [facility] [place] - A Dept Of [place]. [facility] Drucilla Chalet, RPH-CPP   12 months ago Essential hypertension   [facility] [place] - A Dept Of [place]. [facility] Hoy Register, MD   1 year ago Tobacco dependence   [place] [facility] - A Dept Of [place]. [facility] Hoy Register, MD   1 year ago Essential hypertension   [place] [facility] [place] - A Dept Of [place]. [facility] Hoy Register, MD       Future Appointments             In 4 months Hoy Register, MD [facility] - A Dept Of [place]. [facility]             furosemide (LASIX) 20 MG tablet 30 tablet 0    Sig: Take 1 tablet (20 mg total) by mouth daily.     Cardiovascular:  Diuretics - Loop Failed - 03/13/2024  4:02 PM      Failed - Ca in normal range and within 180 days    Calcium  Date Value Ref Range Status  02/06/2024 8.6 (L) 8.9 - 10.3 mg/dL Final   Calcium, Ion  Date Value Ref Range Status  07/06/2023 1.19 1.15 - 1.40 mmol/L Final         Failed - Na in normal range and within 180 days    Sodium  Date Value Ref Range Status  02/06/2024 134 (L) 135 - 145 mmol/L Final  01/17/2024 139 134 - 144 mmol/L Final         Passed - K in normal range and within 180 days    Potassium  Date Value Ref Range Status  02/06/2024 4.1  3.5 - 5.1 mmol/L Final         Passed - Cr in normal range and within 180 days    Creat  Date Value Ref Range Status  03/01/2017 1.24 0.70 - 1.25 mg/dL Final    Comment:      For patients > or = 70 years of age: The upper reference limit for Creatinine is approximately 13% higher for people identified as African-American.      Creatinine, Ser  Date Value Ref Range Status  02/06/2024 1.14 0.61 - 1.24 mg/dL Final         Passed - Cl in normal range and within 180 days    Chloride  Date Value Ref Range Status  02/06/2024 104 98 - 111 mmol/L Final         Passed - Mg Level in normal range and within 180 days    Magnesium  Date Value Ref Range Status  02/06/2024 2.0 1.7 - 2.4 mg/dL Final    Comment:    Performed at [facility], [address], [place], [place] [address]         Passed - Last BP in normal range    BP Readings from Last 1 Encounters:  03/12/24 126/68         Passed - Valid encounter within last 6 months    Recent Outpatient Visits           1 month ago PAD (peripheral artery disease) (HCC)   [place] [facility] - A Dept Of [place]. [facility] Hoy Register, MD   11 months ago Medication management   [facility] [facility] - A Dept Of [place]. [facility] Drucilla Chalet, RPH-CPP   12 months ago Essential hypertension   [place] [facility] [place] - A Dept Of [place]. [facility] Hoy Register, MD   1 year ago Tobacco dependence   [place] [facility] [place] - A Dept Of [place]. [facility] Hoy Register, MD   1 year ago Essential hypertension   [place] [facility] [place] - A Dept Of [place]. [facility] Hoy Register, MD       Future Appointments             In 4 months Hoy Register, MD [facility] - A Dept Of [place]. [facility]            Refused Prescriptions Disp Refills    amLODipine (NORVASC) 10 MG tablet 90 tablet 1    Sig: Take 1 tablet (10 mg total) by mouth daily.     Cardiovascular: Calcium Channel Blockers 2 Passed - 03/13/2024  4:02 PM      Passed - Last BP in normal range    BP Readings from Last 1 Encounters:  03/12/24 126/68         Passed - Last Heart Rate in normal range    Pulse Readings from Last 1 Encounters:  03/12/24 72         Passed - Valid encounter within last 6 months    Recent Outpatient Visits           1 month ago PAD (peripheral artery disease) (HCC)   [place] [facility] - A Dept Of [place]. [facility] Hoy Register, MD   11 months ago Medication management   [place] [facility] [place] - A Dept Of [place]. [facility] Drucilla Chalet, RPH-CPP   12 months ago Essential hypertension   [place] [facility] [place] - A Dept Of [place]. [facility] Hoy Register, MD   1 year ago Tobacco dependence   [place] [facility] [place] - A Dept Of [place]. [facility] Hoy Register, MD   1 year ago Essential hypertension   [place] [facility] [place] - A Dept Of [place]. [facility] Hoy Register, MD       Future Appointments             In 4 months Hoy Register, MD [facility] - A Dept Of [place]. [facility]             atorvastatin (LIPITOR) 20 MG tablet 90 tablet 1    Sig: Take 1 tablet (20 mg total) by mouth daily.     Cardiovascular:  Antilipid - Statins Failed - 03/13/2024  4:02 PM      Failed - Lipid Panel in normal range within the last 12 months    Cholesterol, Total  Date Value Ref Range Status  07/03/2019 156 100 - 199 mg/dL Final   Cholesterol  Date Value Ref Range Status  02/03/2024 85 0 - 200 mg/dL Final   LDL Calculated  Date Value Ref Range Status  07/03/2019 89 0 - 99 mg/dL Final   LDL Cholesterol  Date Value Ref Range Status  02/03/2024 32 0 - 99 mg/dL Final  Comment:           Total Cholesterol/HDL:CHD Risk Coronary Heart Disease Risk Table                     Men   Women  1/2 Average Risk   3.4   3.3  Average Risk       5.0   4.4  2 X Average Risk   9.6   7.1  3 X Average Risk  23.4   11.0        Use the calculated Patient Ratio above and the CHD Risk Table to determine the patient's CHD Risk.        ATP III CLASSIFICATION (LDL):  <100     mg/dL   Optimal  638-756  mg/dL   Near or Above                    Optimal  130-159  mg/dL   Borderline  433-295  mg/dL   High  >188     mg/dL   Very High Performed at [facility], [address], [place], [place] [address]    HDL  Date Value Ref Range Status  02/03/2024 43 >40 mg/dL Final  63/12/6008 54 >93 mg/dL Final   Triglycerides  Date Value Ref Range Status  02/03/2024 50 <150 mg/dL Final         Passed - Patient is not pregnant      Passed - Valid encounter within last 12 months    Recent Outpatient Visits           1 month ago PAD (peripheral artery disease) (HCC)   [place] [facility] [facility] - A Dept Of [place]. [facility] Hoy Register, MD   11 months ago Medication management   [place] [facility] [place] - A Dept Of [place]. [facility] Drucilla Chalet, RPH-CPP   12 months ago Essential hypertension   [place] [facility] [place] - A Dept Of [place]. [facility] Hoy Register, MD   1 year ago Tobacco dependence   [place] [facility] [place] - A Dept Of [place]. [facility] Hoy Register, MD   1 year ago Essential hypertension   [place] [facility] [place] - A Dept Of [place]. [facility] Hoy Register, MD       Future Appointments             In 4 months Hoy Register, MD [facility] - A Dept Of [place]. [facility]

## 2024-03-13 NOTE — Telephone Encounter (Signed)
 Requested Prescriptions  Pending Prescriptions Disp Refills   potassium chloride SA (KLOR-CON M) 20 MEQ tablet 30 tablet 0    Sig: Take 1 tablet (20 mEq total) by mouth daily.     Endocrinology:  Minerals - Potassium Supplementation Passed - 03/13/2024  4:01 PM      Passed - K in normal range and within 360 days    Potassium  Date Value Ref Range Status  02/06/2024 4.1 3.5 - 5.1 mmol/L Final         Passed - Cr in normal range and within 360 days    Creat  Date Value Ref Range Status  03/01/2017 1.24 0.70 - 1.25 mg/dL Final    Comment:      For patients > or = 70 years of age: The upper reference limit for Creatinine is approximately 13% higher for people identified as African-American.      Creatinine, Ser  Date Value Ref Range Status  02/06/2024 1.14 0.61 - 1.24 mg/dL Final         Passed - Valid encounter within last 12 months    Recent Outpatient Visits           1 month ago PAD (peripheral artery disease) (HCC)   [facility] [place]. [facility] Hoy Register, MD   11 months ago Medication management   [facility] - [facility] Dept Of [place]. [facility] Drucilla Chalet, RPH-CPP   12 months ago Essential hypertension   [place] [facility] [place] [place] - A Dept Of [place]. [facility] Hoy Register, MD   1 year ago Tobacco dependence   [place] [facility] [place] - A Dept Of [place]. [facility] Hoy Register, MD   1 year ago Essential hypertension   [place] [facility] [place] - A Dept Of [place]. [facility] Hoy Register, MD       Future Appointments             In 4 months Hoy Register, MD [facility] - A Dept Of [place]. [facility]             lisinopril (ZESTRIL) 20 MG tablet 30 tablet 0    Sig: Take 1 tablet (20 mg total) by mouth daily.     Cardiovascular:  ACE Inhibitors Passed - 03/13/2024   4:01 PM      Passed - Cr in normal range and within 180 days    Creat  Date Value Ref Range Status  03/01/2017 1.24 0.70 - 1.25 mg/dL Final    Comment:      For patients > or = 70 years of age: The upper reference limit for Creatinine is approximately 13% higher for people identified as African-American.      Creatinine, Ser  Date Value Ref Range Status  02/06/2024 1.14 0.61 - 1.24 mg/dL Final         Passed - K in normal range and within 180 days    Potassium  Date Value Ref Range Status  02/06/2024 4.1 3.5 - 5.1 mmol/L Final         Passed - Patient is not pregnant      Passed - Last BP in normal range    BP Readings from Last 1 Encounters:  03/12/24 126/68         Passed - Valid encounter within last  6 months    Recent Outpatient Visits           1 month ago PAD (peripheral artery disease) (HCC)   [place] [facility] - A Dept Of [place]. [facility] Hoy [facility], MD   11 months ago Medication management   [place] [facility] [place] - A Dept Of [place]. [facility] Drucilla Chalet, RPH-CPP   12 months ago Essential hypertension   [place] [facility] [place] - A Dept Of [place]. [facility] Hoy Register, MD   1 year ago Tobacco dependence   [place] [facility] [place] - A Dept Of [place]. [facility] Hoy Register, MD   1 year ago Essential hypertension   [place] [facility] - A Dept Of [place]. [facility] Hoy Register, MD       Future Appointments             In 4 months Hoy Register, MD [facility] [place] - A Dept Of [place]. [facility]             aspirin EC 81 MG tablet 30 tablet 12    Sig: Take 1 tablet (81 mg total) by mouth daily. Swallow whole.     Analgesics:  NSAIDS - aspirin Passed - 03/13/2024  4:01 PM      Passed - Cr in normal range and within 360 days    Creat  Date Value Ref Range Status   03/01/2017 1.24 0.70 - 1.25 mg/dL Final    Comment:      For patients > or = 70 years of age: The upper reference limit for Creatinine is approximately 13% higher for people identified as African-American.      Creatinine, Ser  Date Value Ref Range Status  02/06/2024 1.14 0.61 - 1.24 mg/dL Final         Passed - eGFR is 10 or above and within 360 days    GFR, Est African American  Date Value Ref Range Status  03/01/2017 72 >=60 mL/min Final   GFR calc Af Amer  Date Value Ref Range Status  11/12/2020 80 >59 mL/min/1.73 Final    Comment:    **In accordance with recommendations from the NKF-ASN Task force,**   [facility] is in the process of updating its eGFR calculation to the   2021 CKD-EPI creatinine equation that estimates kidney function   without a race variable.    GFR, Est Non African American  Date Value Ref Range Status  03/01/2017 62 >=60 mL/min Final   GFR, Estimated  Date Value Ref Range Status  02/06/2024 >60 >60 mL/min Final    Comment:    (NOTE) Calculated using the CKD-EPI Creatinine Equation (2021)    eGFR  Date Value Ref Range Status  01/17/2024 65 >59 mL/min/1.73 Final         Passed - Patient is not pregnant      Passed - Valid encounter within last 12 months    Recent Outpatient Visits           1 month ago PAD (peripheral artery disease) (HCC)   [place] [facility] - A Dept Of [place]. [facility] Hoy Register, MD   11 months ago Medication management   [place] [facility] - A Dept Of [place]. [facility] [place], [place] L, RPH-CPP   12 months  ago Essential hypertension   [place] [facility] [place] - A Dept Of [place]. [facility] Hoy Register, MD   1 year ago Tobacco dependence   [place] [facility] [place] - A Dept Of [place]. [facility] Hoy Register, MD   1 year ago Essential hypertension   [place] [facility] [place] - A Dept Of [place].  [facility] Hoy Register, MD       Future Appointments             In 4 months Hoy Register, MD [facility] [place] - A Dept Of [place]. [facility]             amLODipine (NORVASC) 10 MG tablet 90 tablet 1    Sig: Take 1 tablet (10 mg total) by mouth daily.     Cardiovascular: Calcium Channel Blockers 2 Passed - 03/13/2024  4:01 PM      Passed - Last BP in normal range    BP Readings from Last 1 Encounters:  03/12/24 126/68         Passed - Last Heart Rate in normal range    Pulse Readings from Last 1 Encounters:  03/12/24 72         Passed - Valid encounter within last 6 months    Recent Outpatient Visits           1 month ago PAD (peripheral artery disease) (HCC)   [place] [facility] - A Dept Of [place]. [facility] Hoy Register, MD   11 months ago Medication management   [place] [facility] [place] - A Dept Of [employer]. [facility] Drucilla Chalet, RPH-CPP   12 months ago Essential hypertension   [place] [facility] [place] - A Dept Of [place]. [facility] Hoy Register, MD   1 year ago Tobacco dependence   [place] [facility] [place] - A Dept Of [place]. [facility] Hoy Register, MD   1 year ago Essential hypertension   [place] [place] - A Dept Of [place]. [facility] Hoy Register, MD       Future Appointments             In 4 months Hoy Register, MD [facility] [place] - A Dept Of [place]. [facility]             atorvastatin (LIPITOR) 20 MG tablet 90 tablet 1    Sig: Take 1 tablet (20 mg total) by mouth daily.     Cardiovascular:  Antilipid - Statins Failed - 03/13/2024  4:01 PM      Failed - Lipid Panel in normal range within the last 12 months    Cholesterol, Total  Date Value Ref Range Status  07/03/2019 156 100 - 199 mg/dL Final   Cholesterol  Date Value Ref Range  Status  02/03/2024 85 0 - 200 mg/dL Final   LDL Calculated  Date Value Ref Range Status  07/03/2019 89 0 - 99 mg/dL Final   LDL Cholesterol  Date Value Ref Range Status  02/03/2024 32 0 - 99 mg/dL Final    Comment:           Total Cholesterol/HDL:CHD Risk Coronary Heart Disease Risk Table  Men   Women  1/2 Average Risk   3.4   3.3  Average Risk       5.0   4.4  2 X Average Risk   9.6   7.1  3 X Average Risk  23.4   11.0        Use the calculated Patient Ratio above and the CHD Risk Table to determine the patient's CHD Risk.        ATP III CLASSIFICATION (LDL):  <100     mg/dL   Optimal  161-096  mg/dL   Near or Above                    Optimal  130-159  mg/dL   Borderline  045-409  mg/dL   High  >811     mg/dL   Very High Performed at [facility], [address] [place], [place] [address]    HDL  Date Value Ref Range Status  02/03/2024 43 >40 mg/dL Final  29/56/2130 54 >86 mg/dL Final   Triglycerides  Date Value Ref Range Status  02/03/2024 50 <150 mg/dL Final         Passed - Patient is not pregnant      Passed - Valid encounter within last 12 months    Recent Outpatient Visits           1 month ago PAD (peripheral artery disease) (HCC)   [place] [facility] - A Dept Of [place]. [facility] Hoy Register, MD   11 months ago Medication management   [place] [facility] [place] - A Dept Of [place]. [facility] Drucilla Chalet, RPH-CPP   12 months ago Essential hypertension   [place] [facility] [place] - A Dept Of [place]. [facility] Hoy Register, MD   1 year ago Tobacco dependence   [place] [facility] - A Dept Of [place]. [facility] Hoy Register, MD   1 year ago Essential hypertension   [place] [facility] [place] - A Dept Of [place]. [facility] Hoy Register, MD       Future Appointments              In 4 months Hoy Register, MD [facility] [place] - A Dept Of [place]. [facility]             cetirizine (ZYRTEC) 10 MG tablet      Sig: Take 1 tablet (10 mg total) by mouth daily.     Ear, Nose, and Throat:  Antihistamines 2 Passed - 03/13/2024  4:01 PM      Passed - Cr in normal range and within 360 days    Creat  Date Value Ref Range Status  03/01/2017 1.24 0.70 - 1.25 mg/dL Final    Comment:      For patients > or = 70 years of age: The upper reference limit for Creatinine is approximately 13% higher for people identified as African-American.      Creatinine, Ser  Date Value Ref Range Status  02/06/2024 1.14 0.61 - 1.24 mg/dL Final         Passed - Valid encounter within last 12 months    Recent Outpatient Visits           1 month ago PAD (peripheral artery disease) (HCC)    [facility] - A Dept  Of [place]. [facility] Hoy Register, MD   11 months ago Medication management   [place] [facility] [place] - A Dept Of [place]. [facility] Drucilla Chalet, RPH-CPP   12 months ago Essential hypertension   [place] [facility] [place] - A Dept Of [place]. [facility] Hoy Register, MD   1 year ago Tobacco dependence   [place] [facility] [place] - A Dept Of [place]. [facility] Hoy Register, MD   1 year ago Essential hypertension   [facility] [place] - A Dept Of [place]. [facility] Hoy Register, MD       Future Appointments             In 4 months Hoy Register, MD [facility] [place] - A Dept Of [place]. [facility]             fluticasone [facility]) 50 MCG/ACT nasal spray 48 g 0    Sig: Use 2 spray(s) in each nostril once daily     Ear, Nose, and Throat: Nasal Preparations - Corticosteroids Passed - 03/13/2024  4:01 PM      Passed - Valid encounter within last 12 months    Recent Outpatient  Visits           1 month ago PAD (peripheral artery disease) (HCC)   [place] [facility] - A Dept Of [place]. [facility] Hoy Register, MD   11 months ago Medication management   [place] [facility] [place] - A Dept Of [place]. [facility] Drucilla Chalet, RPH-CPP   12 months ago Essential hypertension   [place] [facility] [place] - A Dept Of [place]. [facility] Hoy Register, MD   1 year ago Tobacco dependence   [place] [facility] [place] - A Dept Of [place]. [facility] Hoy Register, MD   1 year ago Essential hypertension    [facility] [place] - A Dept Of [place]. [facility] Hoy Register, MD       Future Appointments             In 4 months Hoy Register, MD [facility] [place] - A Dept Of [place]. [facility]             furosemide (LASIX) 20 MG tablet 30 tablet 0    Sig: Take 1 tablet (20 mg total) by mouth daily.     Cardiovascular:  Diuretics - Loop Failed - 03/13/2024  4:01 PM      Failed - Ca in normal range and within 180 days    Calcium  Date Value Ref Range Status  02/06/2024 8.6 (L) 8.9 - 10.3 mg/dL Final   Calcium, Ion  Date Value Ref Range Status  07/06/2023 1.19 1.15 - 1.40 mmol/L Final         Failed - Na in normal range and within 180 days    Sodium  Date Value Ref Range Status  02/06/2024 134 (L) 135 - 145 mmol/L Final  01/17/2024 139 134 - 144 mmol/L Final         Passed - K in normal range and within 180 days    Potassium  Date Value Ref Range Status  02/06/2024 4.1 3.5 - 5.1 mmol/L Final         Passed -  Cr in normal range and within 180 days    Creat  Date Value Ref Range Status  03/01/2017 1.24 0.70 - 1.25 mg/dL Final    Comment:      For patients > or = 70 years of age: The upper reference limit for Creatinine is approximately 13% higher for people identified as African-American.       Creatinine, Ser  Date Value Ref Range Status  02/06/2024 1.14 0.61 - 1.24 mg/dL Final         Passed - Cl in normal range and within 180 days    Chloride  Date Value Ref Range Status  02/06/2024 104 98 - 111 mmol/L Final         Passed - Mg Level in normal range and within 180 days    Magnesium  Date Value Ref Range Status  02/06/2024 2.0 1.7 - 2.4 mg/dL Final    Comment:    Performed at [facility], [address] [place], [place] [address]         Passed - Last BP in normal range    BP Readings from Last 1 Encounters:  03/12/24 126/68         Passed - Valid encounter within last 6 months    Recent Outpatient Visits           1 month ago PAD (peripheral artery disease) (HCC)   [place] [facility] - A Dept Of [place]. [facility] Hoy Register, MD   11 months ago Medication management   [place] [facility] [place] - A Dept Of [place]. [facility] Drucilla Chalet, RPH-CPP   12 months ago Essential hypertension   [place] [facility] [place] - A Dept Of [place]. [facility] Hoy Register, MD   1 year ago Tobacco dependence   [place] [facility] [place] - A Dept Of [place]. [facility] Hoy Register, MD   1 year ago Essential hypertension   [place] [facility] [place] - A Dept Of [place]. [facility] Hoy Register, MD       Future Appointments             In 4 months Hoy Register, MD [facility] [place] - A Dept Of [place]. [facility]

## 2024-03-15 MED ORDER — POTASSIUM CHLORIDE CRYS ER 20 MEQ PO TBCR
20.0000 meq | EXTENDED_RELEASE_TABLET | Freq: Every day | ORAL | 1 refills | Status: DC
Start: 2024-03-15 — End: 2024-05-21

## 2024-03-22 ENCOUNTER — Ambulatory Visit (HOSPITAL_COMMUNITY)
Admission: RE | Admit: 2024-03-22 | Discharge: 2024-03-22 | Disposition: A | Discharge status: Home / Self Care | Payer: Medicare (Managed Care) | Source: Ambulatory Visit | Attending: Cardiovascular Disease | Admitting: Cardiovascular Disease

## 2024-03-22 DIAGNOSIS — I10 Essential (primary) hypertension: Secondary | ICD-10-CM | POA: Insufficient documentation

## 2024-03-22 DIAGNOSIS — I70219 Atherosclerosis of native arteries of extremities with intermittent claudication, unspecified extremity: Secondary | ICD-10-CM | POA: Insufficient documentation

## 2024-03-22 DIAGNOSIS — E785 Hyperlipidemia, unspecified: Secondary | ICD-10-CM | POA: Insufficient documentation

## 2024-03-22 DIAGNOSIS — Z720 Tobacco use: Secondary | ICD-10-CM | POA: Insufficient documentation

## 2024-03-28 ENCOUNTER — Other Ambulatory Visit (HOSPITAL_COMMUNITY): Payer: Self-pay

## 2024-03-28 DIAGNOSIS — R931 Abnormal findings on diagnostic imaging of heart and coronary circulation: Secondary | ICD-10-CM

## 2024-03-28 DIAGNOSIS — I5020 Unspecified systolic (congestive) heart failure: Secondary | ICD-10-CM

## 2024-04-01 ENCOUNTER — Telehealth: Payer: Self-pay | Admitting: Cardiovascular Disease

## 2024-04-01 NOTE — Telephone Encounter (Signed)
 Received call from Diane with [facility]  3/28 CT Addendum report

## 2024-04-01 NOTE — Telephone Encounter (Signed)
 Dianne - [facility] calling to provide CT calcium addendum report

## 2024-04-01 NOTE — Telephone Encounter (Signed)
 Martin Gess, MD  You43 minutes ago (12:35 PM)    Patient will need a noncontrast chest CT to follow-up his subcentimeter nodules by his PCP.  Patient identification verified by 2 forms. Marilynn Rail, RN    Called and spoke to patient  Relayed provider message  Advised patient to follow up with PCP  Patient verbalized understanding, no questions at this time

## 2024-04-17 ENCOUNTER — Other Ambulatory Visit: Payer: Self-pay | Admitting: Family Medicine

## 2024-04-18 DIAGNOSIS — H52223 Regular astigmatism, bilateral: Secondary | ICD-10-CM | POA: Diagnosis not present

## 2024-04-18 DIAGNOSIS — Z0100 Encounter for examination of eyes and vision without abnormal findings: Secondary | ICD-10-CM | POA: Diagnosis not present

## 2024-04-26 ENCOUNTER — Other Ambulatory Visit: Payer: Self-pay | Admitting: Family Medicine

## 2024-05-05 DIAGNOSIS — Z1212 Encounter for screening for malignant neoplasm of rectum: Secondary | ICD-10-CM | POA: Diagnosis not present

## 2024-05-05 DIAGNOSIS — Z1211 Encounter for screening for malignant neoplasm of colon: Secondary | ICD-10-CM | POA: Diagnosis not present

## 2024-05-12 LAB — COLOGUARD: COLOGUARD: NEGATIVE

## 2024-05-21 ENCOUNTER — Other Ambulatory Visit: Payer: Self-pay | Admitting: Family Medicine

## 2024-05-30 ENCOUNTER — Other Ambulatory Visit: Payer: Self-pay | Admitting: Family Medicine

## 2024-06-24 ENCOUNTER — Telehealth (HOSPITAL_COMMUNITY): Payer: Self-pay | Admitting: *Deleted

## 2024-06-24 NOTE — Telephone Encounter (Signed)
 Attempted to call patient regarding upcoming cardiac PET appointment. Left message on voicemail with name and callback number  Larey Brick RN Navigator Cardiac Imaging [facility] [phone] Office [phone] Cell  Reminder to avoid caffeine 12 hours prior to his cardiac PET study.

## 2024-06-24 NOTE — Telephone Encounter (Signed)
 Reaching out to patient to offer assistance regarding upcoming cardiac imaging study; pt verbalizes understanding of appt date/time, parking situation and where to check in, pre-test NPO status and verified current allergies; name and call back number provided for further questions should they arise  Chantal Requena RN Navigator Cardiac Imaging [facility] [phone] office [phone] cell  Patient aware to avoid caffeine 12 hours prior to his cardiac PET appt. He reports he is feeling SOB right now and wants to go get checked out.  He will call back if he can't make his PET appt.

## 2024-06-25 ENCOUNTER — Other Ambulatory Visit: Payer: Self-pay

## 2024-06-25 ENCOUNTER — Encounter (HOSPITAL_COMMUNITY): Payer: Self-pay | Admitting: Emergency Medicine

## 2024-06-25 ENCOUNTER — Encounter (HOSPITAL_COMMUNITY): Payer: Self-pay

## 2024-06-25 ENCOUNTER — Inpatient Hospital Stay (HOSPITAL_COMMUNITY)
Admission: EM | Admit: 2024-06-25 | Discharge: 2024-06-30 | DRG: 377 | Disposition: A | Discharge status: Home Health | Payer: Medicare (Managed Care) | Source: Intra-hospital | Attending: Internal Medicine | Admitting: Internal Medicine

## 2024-06-25 ENCOUNTER — Encounter (HOSPITAL_COMMUNITY)
Admission: RE | Admit: 2024-06-25 | Discharge: 2024-06-25 | Disposition: A | Discharge status: Home / Self Care | Payer: Medicare (Managed Care) | Source: Ambulatory Visit | Attending: Cardiovascular Disease | Admitting: Cardiovascular Disease

## 2024-06-25 ENCOUNTER — Emergency Department (HOSPITAL_COMMUNITY): Payer: Medicare (Managed Care)

## 2024-06-25 DIAGNOSIS — R931 Abnormal findings on diagnostic imaging of heart and coronary circulation: Secondary | ICD-10-CM | POA: Insufficient documentation

## 2024-06-25 DIAGNOSIS — K31819 Angiodysplasia of stomach and duodenum without bleeding: Secondary | ICD-10-CM | POA: Diagnosis not present

## 2024-06-25 DIAGNOSIS — F1011 Alcohol abuse, in remission: Secondary | ICD-10-CM | POA: Diagnosis present

## 2024-06-25 DIAGNOSIS — K648 Other hemorrhoids: Secondary | ICD-10-CM | POA: Diagnosis present

## 2024-06-25 DIAGNOSIS — B3781 Candidal esophagitis: Secondary | ICD-10-CM | POA: Diagnosis present

## 2024-06-25 DIAGNOSIS — I130 Hypertensive heart and chronic kidney disease with heart failure and stage 1 through stage 4 chronic kidney disease, or unspecified chronic kidney disease: Secondary | ICD-10-CM | POA: Diagnosis not present

## 2024-06-25 DIAGNOSIS — J439 Emphysema, unspecified: Secondary | ICD-10-CM | POA: Diagnosis present

## 2024-06-25 DIAGNOSIS — J4489 Other specified chronic obstructive pulmonary disease: Secondary | ICD-10-CM | POA: Diagnosis present

## 2024-06-25 DIAGNOSIS — M7989 Other specified soft tissue disorders: Secondary | ICD-10-CM | POA: Diagnosis not present

## 2024-06-25 DIAGNOSIS — Z7902 Long term (current) use of antithrombotics/antiplatelets: Secondary | ICD-10-CM | POA: Diagnosis not present

## 2024-06-25 DIAGNOSIS — N179 Acute kidney failure, unspecified: Secondary | ICD-10-CM | POA: Diagnosis not present

## 2024-06-25 DIAGNOSIS — E559 Vitamin D deficiency, unspecified: Secondary | ICD-10-CM | POA: Diagnosis present

## 2024-06-25 DIAGNOSIS — Z8249 Family history of ischemic heart disease and other diseases of the circulatory system: Secondary | ICD-10-CM | POA: Diagnosis not present

## 2024-06-25 DIAGNOSIS — E872 Acidosis, unspecified: Secondary | ICD-10-CM | POA: Diagnosis present

## 2024-06-25 DIAGNOSIS — K229 Disease of esophagus, unspecified: Secondary | ICD-10-CM | POA: Diagnosis not present

## 2024-06-25 DIAGNOSIS — K31811 Angiodysplasia of stomach and duodenum with bleeding: Secondary | ICD-10-CM | POA: Diagnosis not present

## 2024-06-25 DIAGNOSIS — I739 Peripheral vascular disease, unspecified: Secondary | ICD-10-CM | POA: Diagnosis present

## 2024-06-25 DIAGNOSIS — K2971 Gastritis, unspecified, with bleeding: Secondary | ICD-10-CM | POA: Diagnosis present

## 2024-06-25 DIAGNOSIS — D649 Anemia, unspecified: Secondary | ICD-10-CM | POA: Diagnosis not present

## 2024-06-25 DIAGNOSIS — E785 Hyperlipidemia, unspecified: Secondary | ICD-10-CM | POA: Diagnosis present

## 2024-06-25 DIAGNOSIS — I10 Essential (primary) hypertension: Secondary | ICD-10-CM | POA: Diagnosis not present

## 2024-06-25 DIAGNOSIS — F17210 Nicotine dependence, cigarettes, uncomplicated: Secondary | ICD-10-CM | POA: Diagnosis not present

## 2024-06-25 DIAGNOSIS — I5023 Acute on chronic systolic (congestive) heart failure: Secondary | ICD-10-CM | POA: Diagnosis not present

## 2024-06-25 DIAGNOSIS — Z79899 Other long term (current) drug therapy: Secondary | ICD-10-CM

## 2024-06-25 DIAGNOSIS — K3189 Other diseases of stomach and duodenum: Secondary | ICD-10-CM | POA: Diagnosis not present

## 2024-06-25 DIAGNOSIS — Z833 Family history of diabetes mellitus: Secondary | ICD-10-CM | POA: Diagnosis not present

## 2024-06-25 DIAGNOSIS — Z7982 Long term (current) use of aspirin: Secondary | ICD-10-CM | POA: Diagnosis not present

## 2024-06-25 DIAGNOSIS — K2289 Other specified disease of esophagus: Secondary | ICD-10-CM | POA: Diagnosis not present

## 2024-06-25 DIAGNOSIS — I5020 Unspecified systolic (congestive) heart failure: Secondary | ICD-10-CM | POA: Insufficient documentation

## 2024-06-25 DIAGNOSIS — I110 Hypertensive heart disease with heart failure: Secondary | ICD-10-CM | POA: Diagnosis present

## 2024-06-25 DIAGNOSIS — R609 Edema, unspecified: Secondary | ICD-10-CM | POA: Diagnosis not present

## 2024-06-25 DIAGNOSIS — D62 Acute posthemorrhagic anemia: Secondary | ICD-10-CM | POA: Diagnosis present

## 2024-06-25 DIAGNOSIS — R0602 Shortness of breath: Secondary | ICD-10-CM | POA: Diagnosis not present

## 2024-06-25 DIAGNOSIS — N189 Chronic kidney disease, unspecified: Secondary | ICD-10-CM | POA: Diagnosis not present

## 2024-06-25 DIAGNOSIS — K259 Gastric ulcer, unspecified as acute or chronic, without hemorrhage or perforation: Secondary | ICD-10-CM | POA: Diagnosis not present

## 2024-06-25 DIAGNOSIS — N1831 Chronic kidney disease, stage 3a: Secondary | ICD-10-CM | POA: Diagnosis not present

## 2024-06-25 DIAGNOSIS — D509 Iron deficiency anemia, unspecified: Secondary | ICD-10-CM | POA: Diagnosis not present

## 2024-06-25 DIAGNOSIS — I959 Hypotension, unspecified: Secondary | ICD-10-CM | POA: Diagnosis not present

## 2024-06-25 DIAGNOSIS — R531 Weakness: Secondary | ICD-10-CM | POA: Diagnosis not present

## 2024-06-25 LAB — COMPREHENSIVE METABOLIC PANEL WITH GFR
ALT: 24 U/L (ref 0–44)
AST: 35 U/L (ref 15–41)
Albumin: 3.1 g/dL — ABNORMAL LOW (ref 3.5–5.0)
Alkaline Phosphatase: 61 U/L (ref 38–126)
Anion gap: 10 (ref 5–15)
BUN: 26 mg/dL — ABNORMAL HIGH (ref 8–23)
CO2: 16 mmol/L — ABNORMAL LOW (ref 22–32)
Calcium: 8.6 mg/dL — ABNORMAL LOW (ref 8.9–10.3)
Chloride: 109 mmol/L (ref 98–111)
Creatinine, Ser: 1.94 mg/dL — ABNORMAL HIGH (ref 0.61–1.24)
GFR, Estimated: 37 mL/min — ABNORMAL LOW (ref >60)
Glucose, Bld: 92 mg/dL (ref 70–99)
Potassium: 3.9 mmol/L (ref 3.5–5.1)
Sodium: 135 mmol/L (ref 135–145)
Total Bilirubin: 0.5 mg/dL (ref 0.0–1.2)
Total Protein: 6.1 g/dL — ABNORMAL LOW (ref 6.5–8.1)

## 2024-06-25 LAB — CBC
HCT: 17.6 % — ABNORMAL LOW (ref 39.0–52.0)
Hemoglobin: 5 g/dL — CL (ref 13.0–17.0)
MCH: 19.5 pg — ABNORMAL LOW (ref 26.0–34.0)
MCHC: 28.4 g/dL — ABNORMAL LOW (ref 30.0–36.0)
MCV: 68.5 fL — ABNORMAL LOW (ref 80.0–100.0)
Platelets: 293 10*3/uL (ref 150–400)
RBC: 2.57 MIL/uL — ABNORMAL LOW (ref 4.22–5.81)
RDW: 19 % — ABNORMAL HIGH (ref 11.5–15.5)
WBC: 5.5 10*3/uL (ref 4.0–10.5)
nRBC: 0.5 % — ABNORMAL HIGH (ref 0.0–0.2)

## 2024-06-25 LAB — PREPARE RBC (CROSSMATCH)

## 2024-06-25 LAB — POC OCCULT BLOOD, ED: Fecal Occult Bld: NEGATIVE

## 2024-06-25 MED ORDER — SODIUM CHLORIDE 0.9% IV SOLUTION: Freq: Once | INTRAVENOUS | Status: AC

## 2024-06-25 MED ORDER — REGADENOSON 0.4 MG/5ML IV SOLN
INTRAVENOUS | Status: AC
Start: 2024-06-25 — End: 2024-06-25
  Filled 2024-06-25: qty 5

## 2024-06-25 MED ORDER — REGADENOSON 0.4 MG/5ML IV SOLN: 0.4000 mg | Freq: Once | INTRAVENOUS | Status: DC

## 2024-06-25 NOTE — ED Triage Notes (Signed)
 From home with SOB for 2 days. No hx of such. Reports weaker than normal. Edema to legs bilaterally- ankles looks more swollen that normal. Has not taken lasix  today. Hypotensive 88/58. 500 NS currentyl 120/74. Cbg 102 RA sats 98%

## 2024-06-25 NOTE — ED Provider Notes (Signed)
  EMERGENCY DEPARTMENT AT [facility] Provider Note   CSN: [id number] Arrival date & time: 06/25/24  2037     Patient presents with: Shortness of Breath   Martin Mason is a 68 y.o. male.   68 year old male presenting with shortness of breath, worsening lower extremity edema.  Patient was scheduled to have cardiac imaging completed at [facility] today, says for this reason he did not take any of his medications this morning.  Patient has been experiencing worsening shortness of breath on exertion since yesterday, and has noted worsening bilateral lower extremity edema.  Patient has CHF and is on furosemide .  Denies chest pain, cough,   Shortness of Breath      Prior to Admission medications   Medication Sig Start Date End Date Taking? Authorizing Provider  amLODipine  (NORVASC ) 10 MG tablet Take 1 tablet (10 mg total) by mouth daily. 01/17/24   Newlin, Enobong, MD  aspirin  EC 81 MG tablet Take 1 tablet (81 mg total) by mouth daily. Swallow whole. 03/13/24   Newlin, Enobong, MD  atorvastatin  (LIPITOR) 20 MG tablet Take 1 tablet (20 mg total) by mouth daily. 01/17/24   Newlin, Enobong, MD  cetirizine  (ZYRTEC ) 10 MG tablet Take 1 tablet (10 mg total) by mouth daily. 03/13/24   Newlin, Enobong, MD  cholecalciferol  (CHOLECALCIFEROL ) 25 MCG tablet Take 1 tablet (1,000 Units total) by mouth daily. 02/07/24   Danford, Lonni SQUIBB, MD  cilostazol  (PLETAL ) 100 MG tablet Take 1 tablet (100 mg total) by mouth 2 (two) times daily before a meal. 02/13/24   Gretta Lonni PARAS, MD  ferrous sulfate  325 (65 FE) MG tablet Take 1 tablet (325 mg total) by mouth daily. 07/06/23   Yolande Lamar BROCKS, MD  fluticasone  (FLONASE ) 50 MCG/ACT nasal spray Use 2 spray(s) in each nostril once daily 03/13/24   Newlin, Enobong, MD  furosemide  (LASIX ) 20 MG tablet Take 1 tablet by mouth once daily 05/30/24   Newlin, Enobong, MD  lisinopril  (ZESTRIL ) 20 MG tablet Take 1 tablet by mouth once daily 04/26/24    Newlin, Enobong, MD  omeprazole  (PRILOSEC) 40 MG capsule Take 1 capsule (40 mg total) by mouth daily. 01/17/24   Newlin, Enobong, MD  polyethylene glycol (MIRALAX ) 17 g packet Take 17 g by mouth daily. 07/06/23   Yolande Lamar BROCKS, MD  potassium chloride  SA (KLOR-CON  M) 20 MEQ tablet Take 1 tablet by mouth once daily 05/21/24   Newlin, Enobong, MD  sildenafil  (VIAGRA ) 50 MG tablet TAKE 1 TABLET BY MOUTH ONCE DAILY AS NEEDED FOR ERECTILE DYSFUNCTION AT  LEAST  24  HOURS  BETWEEN  DOSES 08/17/23   Newlin, Enobong, MD    Allergies: Patient has no known allergies.    Review of Systems  Respiratory:  Positive for shortness of breath.     Updated Vital Signs  Vitals:   06/25/24 2043 06/25/24 2130 06/25/24 2215  BP: 123/64 (!) 102/56 119/70  Pulse:  74 74  Resp: 20 (!) 22 18  Temp: 97.8 F (36.6 C)    SpO2: 98% 97% 95%     Physical Exam Vitals and nursing note reviewed.  HENT:     Head: Normocephalic.  Eyes:     Extraocular Movements: Extraocular movements intact.  Cardiovascular:     Rate and Rhythm: Normal rate and regular rhythm.  Pulmonary:     Effort: Pulmonary effort is normal.     Breath sounds: Normal breath sounds.     Comments: Speaking in full sentences,  no signs of respiratory distress Genitourinary:    Comments: DRE performed with NT at the bedside as chaperone  No external hemorrhoids or lesions visualized, no palpable masses in the rectal vault. Stool is brown, no obvious blood. Musculoskeletal:     Cervical back: Normal range of motion.     Right lower leg: 2+ Edema present.     Left lower leg: 2+ Edema present.     Comments: Moves all extremities spontaneously without difficulty  Skin:    General: Skin is warm and dry.  Neurological:     Mental Status: He is alert and oriented to person, place, and time.     (all labs ordered are listed, but only abnormal results are displayed) Labs Reviewed  COMPREHENSIVE METABOLIC PANEL WITH GFR - Abnormal; Notable for  the following components:      Result Value   CO2 16 (*)    BUN 26 (*)    Creatinine, Ser 1.94 (*)    Calcium  8.6 (*)    Total Protein 6.1 (*)    Albumin  3.1 (*)    GFR, Estimated 37 (*)    All other components within normal limits  CBC - Abnormal; Notable for the following components:   RBC 2.57 (*)    Hemoglobin 5.0 (*)    HCT 17.6 (*)    MCV 68.5 (*)    MCH 19.5 (*)    MCHC 28.4 (*)    RDW 19.0 (*)    nRBC 0.5 (*)    All other components within normal limits  BRAIN NATRIURETIC PEPTIDE  VITAMIN B12  FOLATE  IRON AND TIBC  FERRITIN  RETICULOCYTES  PROTEIN ELECTROPHORESIS, SERUM  KAPPA/LAMBDA LIGHT CHAINS  IMMUNOGLOBULINS A/E/G/M, SERUM  POC OCCULT BLOOD, ED  TYPE AND SCREEN  PREPARE RBC (CROSSMATCH)    EKG: None  Radiology: No results found.   Procedures   Medications Ordered in the ED  0.9 %  sodium chloride  infusion (Manually program via Guardrails IV Fluids) (has no administration in time range)                                    Medical Decision Making This patient presents to the ED for concern of shortness of breath, this involves an extensive number of treatment options, and is a complaint that carries with it a high risk of complications and morbidity.  The differential diagnosis includes heart failure exacerbation, COPD exacerbation, pneumonia, anemia, electrolyte disturbance.   Co morbidities that complicate the patient evaluation  CHF, hypertension   Additional history obtained:  Additional history obtained from record review External records from outside source obtained and reviewed including prior PCP notes   Lab Tests:  I Ordered, and personally interpreted labs.  The pertinent results include: CBC notable for RBC count of 2.57 and hemoglobin of 5, this is significantly decreased as compared to his hemoglobin from 4 months ago, similarly there is a decline in hematocrit/MCV/MCH/MCHC.  CMP notable for worsening kidney function with  creatinine of 1.94 as compared to 1.14 from 4 months ago, BUN of 26 as compared to 13 from 4 months ago, GFR of 37 as compared to >60 3 months ago.  DRE negative.   Imaging Studies ordered:  I ordered imaging studies including CXR  I independently visualized and interpreted imaging which showed 1. No active disease. 2. Mild cardiomegaly.  I agree with the radiologist interpretation   Cardiac Monitoring: /  EKG:  The patient was maintained on a cardiac monitor.  I personally viewed and interpreted the cardiac monitored which showed an underlying rhythm of: NSR   Consultations Obtained:  I requested consultation with the hospitalist,  and discussed lab and imaging findings as well as pertinent plan - they recommend: spoke with Dr. Charlton who agrees this patient is appropriate for admission for blood transfusion and to address AKI.   Problem List / ED Course / Critical interventions / Medication management   I ordered medication including 1 unit packed RBCs for anemia Reevaluation of the patient after these medicines showed that the patient stayed the same I have reviewed the patients home medicines and have made adjustments as needed   Social Determinants of Health:  Tobacco use   Test / Admission - Considered:  Physical exam is notable for bilateral pitting edema of the lower extremities, otherwise largely unremarkable, lungs are clear to auscultation bilaterally without crackles, patient is speaking in full sentences and is not demonstrating respiratory distress.  DRE was performed, Hemoccult negative.  Patient is severely anemic from unknown cause, he is also demonstrating evidence of an acute kidney injury with elevation in his creatinine.  I ordered 1 unit of packed red blood cells to be administered in the emergency department for correction of his anemia.  I called and spoke with the hospitalist who is in agreement that this patient is appropriate for admission.    Amount  and/or Complexity of Data Reviewed Labs: ordered. Radiology: ordered.  Risk Prescription drug management. Decision regarding hospitalization.        Final diagnoses:  Acute on chronic anemia  AKI (acute kidney injury) [facility])    ED Discharge Orders     None          Glendia Rocky LOISE DEVONNA 06/25/24 2330    Franklyn Sid LOISE, MD 06/28/24 1907

## 2024-06-25 NOTE — ED Notes (Signed)
 Report given to nurse taking over care.

## 2024-06-26 ENCOUNTER — Encounter (HOSPITAL_COMMUNITY): Payer: Self-pay | Admitting: Family Medicine

## 2024-06-26 ENCOUNTER — Inpatient Hospital Stay (HOSPITAL_COMMUNITY): Payer: Medicare (Managed Care)

## 2024-06-26 DIAGNOSIS — M7989 Other specified soft tissue disorders: Secondary | ICD-10-CM | POA: Diagnosis not present

## 2024-06-26 DIAGNOSIS — N179 Acute kidney failure, unspecified: Secondary | ICD-10-CM | POA: Diagnosis present

## 2024-06-26 LAB — BASIC METABOLIC PANEL WITH GFR
Anion gap: 10 (ref 5–15)
BUN: 21 mg/dL (ref 8–23)
CO2: 17 mmol/L — ABNORMAL LOW (ref 22–32)
Calcium: 8.6 mg/dL — ABNORMAL LOW (ref 8.9–10.3)
Chloride: 107 mmol/L (ref 98–111)
Creatinine, Ser: 1.61 mg/dL — ABNORMAL HIGH (ref 0.61–1.24)
GFR, Estimated: 46 mL/min — ABNORMAL LOW (ref >60)
Glucose, Bld: 91 mg/dL (ref 70–99)
Potassium: 3.9 mmol/L (ref 3.5–5.1)
Sodium: 134 mmol/L — ABNORMAL LOW (ref 135–145)

## 2024-06-26 LAB — CBC
HCT: 19.7 % — ABNORMAL LOW (ref 39.0–52.0)
Hemoglobin: 5.6 g/dL — CL (ref 13.0–17.0)
MCH: 19.6 pg — ABNORMAL LOW (ref 26.0–34.0)
MCHC: 28.4 g/dL — ABNORMAL LOW (ref 30.0–36.0)
MCV: 68.9 fL — ABNORMAL LOW (ref 80.0–100.0)
Platelets: 295 10*3/uL (ref 150–400)
RBC: 2.86 MIL/uL — ABNORMAL LOW (ref 4.22–5.81)
RDW: 18.8 % — ABNORMAL HIGH (ref 11.5–15.5)
WBC: 4.9 10*3/uL (ref 4.0–10.5)
nRBC: 0.8 % — ABNORMAL HIGH (ref 0.0–0.2)

## 2024-06-26 LAB — VITAMIN B12: Vitamin B-12: 347 pg/mL (ref 180–914)

## 2024-06-26 LAB — RETICULOCYTES
Immature Retic Fract: 26.2 % — ABNORMAL HIGH (ref 2.3–15.9)
RBC.: 2.63 MIL/uL — ABNORMAL LOW (ref 4.22–5.81)
Retic Count, Absolute: 36.3 10*3/uL (ref 19.0–186.0)
Retic Ct Pct: 1.4 % (ref 0.4–3.1)

## 2024-06-26 LAB — IRON AND TIBC
Iron: 17 ug/dL — ABNORMAL LOW (ref 45–182)
Saturation Ratios: 5 % — ABNORMAL LOW (ref 17.9–39.5)
TIBC: 378 ug/dL (ref 250–450)
UIBC: 361 ug/dL

## 2024-06-26 LAB — FERRITIN: Ferritin: 7 ng/mL — ABNORMAL LOW (ref 24–336)

## 2024-06-26 LAB — HEMOGLOBIN AND HEMATOCRIT, BLOOD
HCT: 24.7 % — ABNORMAL LOW (ref 39.0–52.0)
Hemoglobin: 7.4 g/dL — ABNORMAL LOW (ref 13.0–17.0)

## 2024-06-26 LAB — BRAIN NATRIURETIC PEPTIDE: B Natriuretic Peptide: 1333.7 pg/mL — ABNORMAL HIGH (ref 0.0–100.0)

## 2024-06-26 LAB — FOLATE: Folate: 15.8 ng/mL (ref >5.9)

## 2024-06-26 LAB — MAGNESIUM: Magnesium: 2.2 mg/dL (ref 1.7–2.4)

## 2024-06-26 MED ORDER — FERROUS SULFATE 325 (65 FE) MG PO TABS
325.0000 mg | ORAL_TABLET | Freq: Two times a day (BID) | ORAL | Status: DC
  Administered 2024-06-26 – 2024-06-30 (×8): 325 mg via ORAL
  Filled 2024-06-26 (×7): qty 1

## 2024-06-26 MED ORDER — ONDANSETRON HCL 4 MG PO TABS: 4.0000 mg | ORAL_TABLET | Freq: Four times a day (QID) | ORAL | Status: DC | PRN

## 2024-06-26 MED ORDER — ACETAMINOPHEN 650 MG RE SUPP: 650.0000 mg | Freq: Four times a day (QID) | RECTAL | Status: DC | PRN

## 2024-06-26 MED ORDER — OXYCODONE HCL 5 MG PO TABS
5.0000 mg | ORAL_TABLET | ORAL | Status: DC | PRN
  Administered 2024-06-27 – 2024-06-29 (×4): 5 mg via ORAL
  Filled 2024-06-26 (×5): qty 1

## 2024-06-26 MED ORDER — PANTOPRAZOLE SODIUM 40 MG PO TBEC
40.0000 mg | DELAYED_RELEASE_TABLET | Freq: Every day | ORAL | Status: DC
  Administered 2024-06-26 – 2024-06-27 (×2): 40 mg via ORAL
  Filled 2024-06-26 (×2): qty 1

## 2024-06-26 MED ORDER — CHOLECALCIFEROL 10 MCG (400 UNIT) PO TABS
400.0000 [IU] | ORAL_TABLET | Freq: Every day | ORAL | Status: DC
  Administered 2024-06-27 – 2024-06-30 (×3): 400 [IU] via ORAL
  Filled 2024-06-26 (×6): qty 1

## 2024-06-26 MED ORDER — FUROSEMIDE 10 MG/ML IJ SOLN
40.0000 mg | Freq: Two times a day (BID) | INTRAMUSCULAR | Status: DC
  Administered 2024-06-26 – 2024-06-27 (×3): 40 mg via INTRAVENOUS
  Filled 2024-06-26 (×3): qty 4

## 2024-06-26 MED ORDER — ACETAMINOPHEN 325 MG PO TABS: 650.0000 mg | ORAL_TABLET | Freq: Four times a day (QID) | ORAL | Status: DC | PRN

## 2024-06-26 MED ORDER — SENNA 8.6 MG PO TABS: 1.0000 | ORAL_TABLET | Freq: Every day | ORAL | Status: DC | PRN

## 2024-06-26 MED ORDER — ATORVASTATIN CALCIUM 10 MG PO TABS
20.0000 mg | ORAL_TABLET | Freq: Every day | ORAL | Status: DC
  Administered 2024-06-26 – 2024-06-30 (×4): 20 mg via ORAL
  Filled 2024-06-26 (×6): qty 2

## 2024-06-26 MED ORDER — ASPIRIN 81 MG PO TBEC
81.0000 mg | DELAYED_RELEASE_TABLET | Freq: Every day | ORAL | Status: DC
  Administered 2024-06-26 – 2024-06-27 (×2): 81 mg via ORAL
  Filled 2024-06-26 (×2): qty 1

## 2024-06-26 MED ORDER — CYCLOBENZAPRINE HCL 5 MG PO TABS
5.0000 mg | ORAL_TABLET | Freq: Three times a day (TID) | ORAL | Status: DC | PRN
  Administered 2024-06-26 – 2024-06-27 (×3): 5 mg via ORAL
  Filled 2024-06-26 (×3): qty 1

## 2024-06-26 MED ORDER — ONDANSETRON HCL 4 MG/2ML IJ SOLN: 4.0000 mg | Freq: Four times a day (QID) | INTRAMUSCULAR | Status: DC | PRN

## 2024-06-26 MED ORDER — SODIUM CHLORIDE 0.9% FLUSH
3.0000 mL | Freq: Two times a day (BID) | INTRAVENOUS | Status: DC
  Administered 2024-06-26 – 2024-06-29 (×7): 3 mL via INTRAVENOUS

## 2024-06-26 NOTE — ED Notes (Signed)
 Pt was found to have bugs on his clothes which appear to look like bed bugs. Pt was taken to the decon room for cleaning. Pts belongings are bagged and with him.

## 2024-06-26 NOTE — Progress Notes (Signed)
 PROGRESS NOTE    Martin Mason  FMW:[id number] DOB: 13-Sep-1956 DOA: 06/25/2024 PCP: Delbert Clam, MD   Brief Narrative:   68 y.o. male with medical history significant for hypertension, hyperlipidemia, PAD, chronic HFmrEF, iron deficiency anemia, and alcohol abuse in remission who now presents with shortness of breath, increased leg swelling, and fatigue, found to have anemia without evidence of acute bleeding and received 1 unit of PRBC.  Acute blood loss anemia/symptomatic anemia, POA: Hemoglobin of 5 upon arrival.  Hemoglobin was 8.5 in February 2025. Status post 1 unit of PRBC.  Follow-up repeat hemoglobin. Follow-up H&H closely. There is no evidence of active bleeding.  Fecal occult blood testing is negative.  Acute kidney injury, POA: Continue to hold lisinopril  and follow-up clinically closely.  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  Echocardiogram done in March 2025 showed EF of 45 to 50%.  Patient's symptoms Lasix .  Fluid station normal plan for liters per day, Daily weight and strict intake, monitoring.  Essential hypertension, POA: Blood pressure on the lower side so we will avoid any scheduled antihypertensives.  Assessment & Plan:  Principal Problem:   Symptomatic anemia Active Problems:   Acute on chronic heart failure with mildly reduced ejection fraction (HFmrEF, 41-49%) (HCC)   Essential hypertension, benign   AKI (acute kidney injury) (HCC)   68 y.o. male with medical history significant for hypertension, hyperlipidemia, PAD, chronic HFmrEF, iron deficiency anemia, and alcohol abuse in remission who now presents with shortness of breath, increased leg swelling, and fatigue, found to have anemia without evidence of acute bleeding and received 1 unit of PRBC.  Acute blood loss anemia/symptomatic anemia, POA: Hemoglobin of 5 upon arrival.  Hemoglobin was 8.5 in February 2025. Status post 1 unit of PRBC.  Follow-up  repeat hemoglobin. Follow-up H&H closely. There is no evidence of active bleeding.  Fecal occult blood testing is negative. Iron studies showed severe iron deficiency anemia  Acute kidney injury with non-anion gap metabolic acidosis, POA: Continue to hold lisinopril  and follow-up clinically closely.  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  Echocardiogram done in March 2025 showed EF of 45 to 50%.  Patient's symptoms Lasix .  Fluid station normal plan for liters per day, Daily weight and strict intake, monitoring.  Essential hypertension, POA: Blood pressure on the lower side so we will avoid any scheduled antihypertensives.  COPD, POA: Prior CT chest in March 2025 show evidence of emphysema along with pulmonary nodules.  Patient will need outpatient follow-up with pulmonology.  Continue with as needed inhalation bronchodilator therapy.  Vitamin D  deficiency, POA: Continue vitamin D  supplementation  Disposition: Lives at home with his brothers.   DVT prophylaxis: SCDs Start: 06/26/24 0145     Code Status: Full Code Family Communication:   Status is: Inpatient Remains inpatient appropriate because: Acute blood loss anemia    Subjective:    Examination:  General exam: Appears calm and comfortable  Respiratory system: Clear to auscultation. Respiratory effort normal. Cardiovascular system: S1 & S2 heard, RRR. No JVD, murmurs, rubs, gallops or clicks. No pedal edema. Gastrointestinal system: Abdomen is nondistended, soft and nontender. No organomegaly or masses felt. Normal bowel sounds heard. Central nervous system: Alert and oriented. No focal neurological deficits. Extremities: Symmetric 5 x 5 power. Skin: No rashes, lesions or ulcers Psychiatry: Judgement and insight appear normal. Mood & affect appropriate.  @ENCIMAGES @      Diet Orders (From admission,  onward)     Start     Ordered   06/26/24 0145  Diet Heart Room  service appropriate? Yes; Fluid consistency: Thin  Diet effective now       Question Answer Comment  Room service appropriate? Yes   Fluid consistency: Thin      06/26/24 0146            Objective: Vitals:   06/26/24 0448 06/26/24 0528 06/26/24 0617 06/26/24 0811  BP: (!) 124/56 (!) 124/50 (!) 125/56 108/61  Pulse: 77 72 73 78  Resp: 15 14 18 19   Temp: 98.1 F (36.7 C) 97.8 F (36.6 C) 98.5 F (36.9 C) 98.1 F (36.7 C)  TempSrc: Oral Oral  Oral  SpO2: 97% 96% 92% 99%    Intake/Output Summary (Last 24 hours) at 06/26/2024 1026 Last data filed at 06/26/2024 0949 Gross per 24 hour  Intake 303 ml  Output 300 ml  Net 3 ml   There were no vitals filed for this visit.  Scheduled Meds:  aspirin  EC  81 mg Oral Daily   atorvastatin   20 mg Oral Daily   furosemide   40 mg Intravenous BID   pantoprazole   40 mg Oral Daily   sodium chloride  flush  3 mL Intravenous Q12H   Continuous Infusions:  Nutritional status     There is no height or weight on file to calculate BMI.  Data Reviewed:   CBC: Recent Labs  Lab 06/25/24 2048 06/26/24 0517  WBC 5.5 4.9  HGB 5.0* 5.6*  HCT 17.6* 19.7*  MCV 68.5* 68.9*  PLT 293 295   Basic Metabolic Panel: Recent Labs  Lab 06/25/24 2048 06/26/24 0517  NA 135 134*  K 3.9 3.9  CL 109 107  CO2 16* 17*  GLUCOSE 92 91  BUN 26* 21  CREATININE 1.94* 1.61*  CALCIUM  8.6* 8.6*  MG  --  2.2   GFR: CrCl cannot be calculated (Unknown ideal weight.). Liver Function Tests: Recent Labs  Lab 06/25/24 2048  AST 35  ALT 24  ALKPHOS 61  BILITOT 0.5  PROT 6.1*  ALBUMIN  3.1*   No results for input(s): LIPASE, AMYLASE in the last 168 hours. No results for input(s): AMMONIA in the last 168 hours. Coagulation Profile: No results for input(s): INR, PROTIME in the last 168 hours. Cardiac Enzymes: No results for input(s): CKTOTAL, CKMB, CKMBINDEX, TROPONINI in the last 168 hours. BNP (last 3 results) Recent Labs     01/17/24 1447  PROBNP 2,597*   HbA1C: No results for input(s): HGBA1C in the last 72 hours. CBG: No results for input(s): GLUCAP in the last 168 hours. Lipid Profile: No results for input(s): CHOL, HDL, LDLCALC, TRIG, CHOLHDL, LDLDIRECT in the last 72 hours. Thyroid Function Tests: No results for input(s): TSH, T4TOTAL, FREET4, T3FREE, THYROIDAB in the last 72 hours. Anemia Panel: Recent Labs    06/26/24 0043  VITAMINB12 347  FOLATE 15.8  FERRITIN 7*  TIBC 378  IRON 17*  RETICCTPCT 1.4   Sepsis Labs: No results for input(s): PROCALCITON, LATICACIDVEN in the last 168 hours.  No results found for this or any previous visit (from the past 240 hours).       Radiology Studies: DG Chest Port 1 View Result Date: 06/25/2024 CLINICAL DATA:  Shortness of breath. EXAM: PORTABLE CHEST 1 VIEW COMPARISON:  Chest CT dated 02/03/2024. FINDINGS: No focal consolidation, pleural effusion or pneumothorax. Mild cardiomegaly. No acute osseous pathology. IMPRESSION: 1. No active disease. 2. Mild cardiomegaly. Electronically Signed  By: Vanetta Chou M.D.   On: 06/25/2024 21:44           LOS: 1 day   Time spent= 35 mins    Deliliah Room, MD [facility]  If 7PM-7AM, please contact night-coverage  06/26/2024, 10:26 AM

## 2024-06-26 NOTE — Progress Notes (Signed)
 RLE venous duplex has been completed.    Results can be found under chart review under CV PROC. 06/26/2024 11:50 AM Arslan Kier RVT, RDMS

## 2024-06-26 NOTE — H&P (Signed)
 History and Physical    Martin Mason FMW:[id number] DOB: May 29, 1956 DOA: 06/25/2024  PCP: Delbert Clam, MD   Patient coming from: Home   Chief Complaint: Increased leg swelling, SOB, fatigue   HPI: Martin Mason is a 68 y.o. male with medical history significant for hypertension, hyperlipidemia, PAD, chronic HFmrEF, iron deficiency anemia, and alcohol abuse in remission who now presents with shortness of breath, increased leg swelling, and fatigue.  Patient reports that his shortness of breath has worsened over the past 2 days and he has noted that his legs are more swollen than usual.  He has also been feeling more generally weak and fatigued.  He denies abdominal pain, chest pain, or significant cough.  He denies seeing any blood in his stool but notes that it has been dark on occasion.  He denies fever or chills.  ED Course: Upon arrival to the ED, patient is found to be afebrile and saturating well on room air with normal HR and stable BP.  Labs are most notable for serum bicarbonate 16, creatinine 1.94, normal WBC, hemoglobin 5.0, BNP 1334, and negative FOBT.  Chest x-ray is negative for acute findings.  1 unit RBC was ordered for transfusion.  Review of Systems:  All other systems reviewed and apart from HPI, are negative.  Past Medical History:  Diagnosis Date   Asthma    childhood- young child to age 57   Hypertension    Peripheral vascular disease (HCC)     Past Surgical History:  Procedure Laterality Date   COLONOSCOPY     CYST EXCISION     throat   TOOTH EXTRACTION N/A 11/06/2020   Procedure: DENTAL RESTORATION/EXTRACTIONS;  Surgeon: Sheryle Hamilton, DDS;  Location: [facility] OR;  Service: Oral Surgery;  Laterality: N/A;    Social History:   reports that he has been smoking cigarettes. He has never used smokeless tobacco. He reports current drug use. Drug: Marijuana. He reports that he does not drink alcohol.  No Known Allergies  Family History  Problem Relation Age of  Onset   Hypertension Mother    Cancer Mother    Hypertension Father    Diabetes Sister      Prior to Admission medications   Medication Sig Start Date End Date Taking? Authorizing Provider  amLODipine  (NORVASC ) 10 MG tablet Take 1 tablet (10 mg total) by mouth daily. 01/17/24   Newlin, Enobong, MD  aspirin  EC 81 MG tablet Take 1 tablet (81 mg total) by mouth daily. Swallow whole. 03/13/24   Newlin, Enobong, MD  atorvastatin  (LIPITOR) 20 MG tablet Take 1 tablet (20 mg total) by mouth daily. 01/17/24   Newlin, Enobong, MD  cetirizine  (ZYRTEC ) 10 MG tablet Take 1 tablet (10 mg total) by mouth daily. 03/13/24   Newlin, Enobong, MD  cholecalciferol  (CHOLECALCIFEROL ) 25 MCG tablet Take 1 tablet (1,000 Units total) by mouth daily. 02/07/24   Danford, Lonni SQUIBB, MD  cilostazol  (PLETAL ) 100 MG tablet Take 1 tablet (100 mg total) by mouth 2 (two) times daily before a meal. 02/13/24   Gretta Lonni PARAS, MD  ferrous sulfate  325 (65 FE) MG tablet Take 1 tablet (325 mg total) by mouth daily. 07/06/23   Yolande Lamar BROCKS, MD  fluticasone  (FLONASE ) 50 MCG/ACT nasal spray Use 2 spray(s) in each nostril once daily 03/13/24   Newlin, Enobong, MD  furosemide  (LASIX ) 20 MG tablet Take 1 tablet by mouth once daily 05/30/24   Newlin, Enobong, MD  lisinopril  (ZESTRIL ) 20 MG tablet Take 1 tablet  by mouth once daily 04/26/24   Newlin, Enobong, MD  omeprazole  (PRILOSEC) 40 MG capsule Take 1 capsule (40 mg total) by mouth daily. 01/17/24   Newlin, Enobong, MD  polyethylene glycol (MIRALAX ) 17 g packet Take 17 g by mouth daily. 07/06/23   Yolande Lamar BROCKS, MD  potassium chloride  SA (KLOR-CON  M) 20 MEQ tablet Take 1 tablet by mouth once daily 05/21/24   Newlin, Enobong, MD  sildenafil  (VIAGRA ) 50 MG tablet TAKE 1 TABLET BY MOUTH ONCE DAILY AS NEEDED FOR ERECTILE DYSFUNCTION AT  LEAST  24  HOURS  BETWEEN  DOSES 08/17/23   Delbert Clam, MD    Physical Exam: Vitals:   06/25/24 2043 06/25/24 2130 06/25/24 2215  BP: 123/64  (!) 102/56 119/70  Pulse:  74 74  Resp: 20 (!) 22 18  Temp: 97.8 F (36.6 C)    SpO2: 98% 97% 95%    Constitutional: NAD, no pallor or diaphoresis   Eyes: PERTLA, lids and conjunctivae normal ENMT: Mucous membranes are moist. Posterior pharynx clear of any exudate or lesions.   Neck: supple, no masses  Respiratory: no wheezing, no crackles. No accessory muscle use.  Cardiovascular: S1 & S2 heard, regular rate and rhythm. Right ? Left lower extremity edema.  Abdomen: No tenderness, soft. Bowel sounds active.  Musculoskeletal: no clubbing / cyanosis. No joint deformity upper and lower extremities.   Skin: no significant rashes, lesions, ulcers. Warm, dry, well-perfused. Neurologic: CN 2-12 grossly intact. Moving all extremities. Alert and oriented.  Psychiatric: Calm. Cooperative.    Labs and Imaging on Admission: I have personally reviewed following labs and imaging studies  CBC: Recent Labs  Lab 06/25/24 2048  WBC 5.5  HGB 5.0*  HCT 17.6*  MCV 68.5*  PLT 293   Basic Metabolic Panel: Recent Labs  Lab 06/25/24 2048  NA 135  K 3.9  CL 109  CO2 16*  GLUCOSE 92  BUN 26*  CREATININE 1.94*  CALCIUM  8.6*   GFR: CrCl cannot be calculated (Unknown ideal weight.). Liver Function Tests: Recent Labs  Lab 06/25/24 2048  AST 35  ALT 24  ALKPHOS 61  BILITOT 0.5  PROT 6.1*  ALBUMIN  3.1*   No results for input(s): LIPASE, AMYLASE in the last 168 hours. No results for input(s): AMMONIA in the last 168 hours. Coagulation Profile: No results for input(s): INR, PROTIME in the last 168 hours. Cardiac Enzymes: No results for input(s): CKTOTAL, CKMB, CKMBINDEX, TROPONINI in the last 168 hours. BNP (last 3 results) Recent Labs    01/17/24 1447  PROBNP 2,597*   HbA1C: No results for input(s): HGBA1C in the last 72 hours. CBG: No results for input(s): GLUCAP in the last 168 hours. Lipid Profile: No results for input(s): CHOL, HDL, LDLCALC,  TRIG, CHOLHDL, LDLDIRECT in the last 72 hours. Thyroid Function Tests: No results for input(s): TSH, T4TOTAL, FREET4, T3FREE, THYROIDAB in the last 72 hours. Anemia Panel: Recent Labs    06/26/24 0043  RETICCTPCT 1.4   Urine analysis:    Component Value Date/Time   COLORURINE STRAW (A) 02/03/2024 1536   APPEARANCEUR CLEAR 02/03/2024 1536   LABSPEC 1.008 02/03/2024 1536   PHURINE 6.0 02/03/2024 1536   GLUCOSEU NEGATIVE 02/03/2024 1536   HGBUR NEGATIVE 02/03/2024 1536   BILIRUBINUR NEGATIVE 02/03/2024 1536   KETONESUR NEGATIVE 02/03/2024 1536   PROTEINUR NEGATIVE 02/03/2024 1536   UROBILINOGEN 1.0 06/14/2010 2004   NITRITE NEGATIVE 02/03/2024 1536   LEUKOCYTESUR NEGATIVE 02/03/2024 1536   Sepsis Labs: @LABRCNTIP (procalcitonin:4,lacticidven:4) )No results  found for this or any previous visit (from the past 240 hours).   Radiological Exams on Admission: DG Chest Port 1 View Result Date: 06/25/2024 CLINICAL DATA:  Shortness of breath. EXAM: PORTABLE CHEST 1 VIEW COMPARISON:  Chest CT dated 02/03/2024. FINDINGS: No focal consolidation, pleural effusion or pneumothorax. Mild cardiomegaly. No acute osseous pathology. IMPRESSION: 1. No active disease. 2. Mild cardiomegaly. Electronically Signed   By: Vanetta Chou M.D.   On: 06/25/2024 21:44    EKG: Independently reviewed. Sinus rhythm, PVCs.   Assessment/Plan   1. Symptomatic anemia  - Hgb is 5.0, down from 8.5 in February 2025  - No overt bleeding and FOBT is negative  - He is being transfused with 1 unit RBC from ED  - Check anemia panel, and given the AKI, will also check SPEP, serum light chains, and quantitative immunoglobulins, check post-transfusion H&H and transfuse additional RBC if needed    2. AKI  - He appears hypervolemic  - Hold lisinopril , renally-dose medications, monitor closely while diuresing    3. Acute on chronic HFmrEF  - EF was 45-50% on TTE from March 2025  - Diurese with IV Lasix ,  monitor weight and I/Os    4. Hypertension  - BP was low with EMS and antihypertensives held on admission    DVT prophylaxis: SCDs  Code Status: Full  Level of Care: Level of care: Telemetry Medical Family Communication: none present  Disposition Plan:  Patient is from: Home  Anticipated d/c is to: TBD Anticipated d/c date is: 06/28/24  Patient currently: Pending improved and stable H&H, stable creatinine, RLE Doppler  Consults called: None  Admission status: Inpatient     Evalene GORMAN Sprinkles, MD [facility]  06/26/2024, 1:46 AM

## 2024-06-26 NOTE — Plan of Care (Signed)
  Problem: Education: Goal: Knowledge of General Education information will improve Description: Including pain rating scale, medication(s)/side effects and non-pharmacologic comfort measures Outcome: Progressing   Problem: Health Behavior/Discharge Planning: Goal: Ability to manage health-related needs will improve Outcome: Progressing   Problem: Clinical Measurements: Goal: Ability to maintain clinical measurements within normal limits will improve Outcome: Progressing Goal: Will remain free from infection Outcome: Progressing Goal: Diagnostic test results will improve Outcome: Progressing Goal: Respiratory complications will improve Outcome: Progressing Goal: Cardiovascular complication will be avoided Outcome: Progressing   Problem: Activity: Goal: Risk for activity intolerance will decrease Outcome: Progressing   Problem: Nutrition: Goal: Adequate nutrition will be maintained Outcome: Progressing   Problem: Coping: Goal: Level of anxiety will decrease Outcome: Progressing   Problem: Elimination: Goal: Will not experience complications related to bowel motility Outcome: Progressing Goal: Will not experience complications related to urinary retention Outcome: Progressing   Problem: Pain Managment: Goal: General experience of comfort will improve and/or be controlled Outcome: Progressing   Problem: Safety: Goal: Ability to remain free from injury will improve Outcome: Progressing   Problem: Skin Integrity: Goal: Risk for impaired skin integrity will decrease Outcome: Progressing   Problem: Education: Goal: Ability to demonstrate management of disease process will improve Outcome: Progressing Goal: Ability to verbalize understanding of medication therapies will improve Outcome: Progressing Goal: Individualized Educational Video(s) Outcome: Progressing   Problem: Activity: Goal: Capacity to carry out activities will improve Outcome: Progressing    Problem: Cardiac: Goal: Ability to achieve and maintain adequate cardiopulmonary perfusion will improve Outcome: Progressing   Problem: Activity: Goal: Capacity to carry out activities will improve Outcome: Progressing

## 2024-06-26 NOTE — Plan of Care (Signed)
  Problem: Skin Integrity: Goal: Risk for impaired skin integrity will decrease Outcome: Progressing  Interventions Assess risk factors for imparied skin integrity and/or pressure injuries Initiate pressure injury prevention interventions if Braden Score </= 18 Assess/Monitor skin integrity, appearance and/or temperature Provide skin care

## 2024-06-26 NOTE — Progress Notes (Signed)
 Heart Failure Navigator Progress Note  Assessed for Heart & Vascular TOC clinic readiness.  Patient does not meet criteria due to has a scheduled PCP appointment on 07/17/2024. No HF TOC. .   Navigator will sign off at this time.   Stephane Haddock, BSN, Scientist, clinical (histocompatibility and immunogenetics) Only

## 2024-06-27 DIAGNOSIS — D509 Iron deficiency anemia, unspecified: Secondary | ICD-10-CM

## 2024-06-27 LAB — BASIC METABOLIC PANEL WITH GFR
Anion gap: 11 (ref 5–15)
BUN: 18 mg/dL (ref 8–23)
CO2: 21 mmol/L — ABNORMAL LOW (ref 22–32)
Calcium: 8.9 mg/dL (ref 8.9–10.3)
Chloride: 99 mmol/L (ref 98–111)
Creatinine, Ser: 1.52 mg/dL — ABNORMAL HIGH (ref 0.61–1.24)
GFR, Estimated: 49 mL/min — ABNORMAL LOW (ref >60)
Glucose, Bld: 95 mg/dL (ref 70–99)
Potassium: 3.4 mmol/L — ABNORMAL LOW (ref 3.5–5.1)
Sodium: 131 mmol/L — ABNORMAL LOW (ref 135–145)

## 2024-06-27 LAB — CBC
HCT: 23.6 % — ABNORMAL LOW (ref 39.0–52.0)
Hemoglobin: 7.3 g/dL — ABNORMAL LOW (ref 13.0–17.0)
MCH: 21.2 pg — ABNORMAL LOW (ref 26.0–34.0)
MCHC: 30.9 g/dL (ref 30.0–36.0)
MCV: 68.4 fL — ABNORMAL LOW (ref 80.0–100.0)
Platelets: 337 10*3/uL (ref 150–400)
RBC: 3.45 MIL/uL — ABNORMAL LOW (ref 4.22–5.81)
RDW: 21.7 % — ABNORMAL HIGH (ref 11.5–15.5)
WBC: 7.8 10*3/uL (ref 4.0–10.5)
nRBC: 0.6 % — ABNORMAL HIGH (ref 0.0–0.2)

## 2024-06-27 LAB — KAPPA/LAMBDA LIGHT CHAINS
Kappa free light chain: 40.3 mg/L — ABNORMAL HIGH (ref 3.3–19.4)
Kappa, lambda light chain ratio: 0.78 (ref 0.26–1.65)
Lambda free light chains: 51.4 mg/L — ABNORMAL HIGH (ref 5.7–26.3)

## 2024-06-27 MED ORDER — FUROSEMIDE 40 MG PO TABS
40.0000 mg | ORAL_TABLET | Freq: Every day | ORAL | Status: DC
  Administered 2024-06-28: 40 mg via ORAL
  Filled 2024-06-27: qty 1

## 2024-06-27 MED ORDER — PANTOPRAZOLE SODIUM 40 MG PO TBEC
40.0000 mg | DELAYED_RELEASE_TABLET | Freq: Two times a day (BID) | ORAL | Status: DC
  Administered 2024-06-27 – 2024-06-30 (×6): 40 mg via ORAL
  Filled 2024-06-27 (×6): qty 1

## 2024-06-27 MED ORDER — SIMETHICONE 80 MG PO CHEW
240.0000 mg | CHEWABLE_TABLET | Freq: Once | ORAL | Status: AC
  Administered 2024-06-28: 240 mg via ORAL
  Filled 2024-06-27: qty 3

## 2024-06-27 MED ORDER — NA SULFATE-K SULFATE-MG SULF 17.5-3.13-1.6 GM/177ML PO SOLN
0.5000 | Freq: Once | ORAL | Status: AC
  Administered 2024-06-28: 177 mL via ORAL
  Filled 2024-06-27 (×2): qty 1

## 2024-06-27 MED ORDER — POTASSIUM CHLORIDE 20 MEQ PO PACK
20.0000 meq | PACK | Freq: Once | ORAL | Status: AC
  Administered 2024-06-27: 20 meq via ORAL
  Filled 2024-06-27: qty 1

## 2024-06-27 MED ORDER — SIMETHICONE 80 MG PO CHEW
240.0000 mg | CHEWABLE_TABLET | Freq: Once | ORAL | Status: AC
  Administered 2024-06-29: 240 mg via ORAL
  Filled 2024-06-27: qty 3

## 2024-06-27 MED ORDER — NA SULFATE-K SULFATE-MG SULF 17.5-3.13-1.6 GM/177ML PO SOLN
0.5000 | Freq: Once | ORAL | Status: AC
  Administered 2024-06-29: 177 mL via ORAL

## 2024-06-27 NOTE — Plan of Care (Signed)
   Problem: Clinical Measurements: Goal: Respiratory complications will improve Outcome: Progressing   Problem: Activity: Goal: Risk for activity intolerance will decrease Outcome: Progressing

## 2024-06-27 NOTE — Consult Note (Signed)
 Consultation  Referring Provider: TRH/Rashid Primary Care Physician:  Delbert Clam, MD Primary Gastroenterologist: Sampson  Reason for Consultation: Severe iron  deficiency anemia  HPI: Martin Mason is a 68 y.o. 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 Patient presented to the emergency room yesterday with complaints of swelling in his legs, shortness of breath and fatigue which had been worse over the previous 2 to 3 days. Chest x-ray was negative Labs in the ER showed a BNP of 1334 Sodium 135/potassium 3.9/BUN 26/creatinine 1.94 Albumin  3.1 LFTs within normal limits WBC 5.5/hemoglobin 5/hematocrit 17.6/MCV 68.5/platelets 293 Ferritin 7/iron 17/TIBC 378/iron sat 5  Reviewing his chart he did do a Cologuard in May thousand 25 which was negative He had been hospitalized in February 2025 with acute exacerbation of congestive heart failure and was noted to have hemoglobin as low as 6.7 during that admission, he was also iron deficient and per discharge summary was to be referred to GI for workup.  Looking further back-hemoglobin 10.4 and 2023 Ferritin 15/serum iron 29/iron sat 8/TIBC 365  Patient seems to be unaware that he has had any significant anemia, has not had iron infusions and has not been on oral iron as an outpatient though it looks like this was prescribed in 2024.  He does take aspirin  daily and Pletal .  Stool has repeatedly been documented Hemoccult negative He denies any difficulties with dysphagia or odynophagia, no heartburn or indigestion, no nausea or vomiting, says appetite has been fine, no complaints of abdominal pain, no changes in bowel habits but has noticed that his stools have been dark off and on for quite a while.  No family history of thalassemia sickle cell, or GI  malignancy.  He thinks that he did have a remote colonoscopy 15 to 20 years ago, I cannot see evidence of that in epic or Care Everywhere  Patient received 1 unit of packed RBCs since admission and hemoglobin 5.6 today/hematocrit 19.7  Protein electrophoresis, and kappa lambda light chains pending  No NSAID use   Past Medical History:  Diagnosis Date   Asthma    childhood- young child to age 87   Hypertension    Peripheral vascular disease (HCC)     Past Surgical History:  Procedure Laterality Date   COLONOSCOPY     CYST EXCISION     throat   TOOTH EXTRACTION N/A 11/06/2020   Procedure: DENTAL RESTORATION/EXTRACTIONS;  Surgeon: Sheryle Hamilton, DDS;  Location: [facility] OR;  Service: Oral Surgery;  Laterality: N/A;    Prior to Admission medications   Medication Sig Start Date End Date Taking? Authorizing Provider  amLODipine  (NORVASC ) 10 MG tablet Take 1 tablet (10 mg total) by mouth daily. 01/17/24  Yes Newlin, Enobong, MD  aspirin  EC 81 MG tablet Take 1 tablet (81 mg total) by mouth daily. Swallow whole. 03/13/24  Yes Newlin, Enobong, MD  atorvastatin  (LIPITOR) 20 MG tablet Take 1 tablet (20 mg total) by mouth daily. 01/17/24  Yes Newlin, Enobong, MD  cetirizine  (ZYRTEC ) 10 MG tablet Take 1 tablet (10 mg total) by mouth daily. 03/13/24  Yes Newlin, Enobong, MD  cholecalciferol  (CHOLECALCIFEROL ) 25 MCG tablet Take 1 tablet (1,000 Units total) by mouth daily. 02/07/24  Yes Danford, Lonni SQUIBB, MD  fluticasone  (FLONASE ) 50 MCG/ACT nasal spray Use 2 spray(s) in each nostril once daily Patient taking differently:  Place 1 spray into both nostrils daily as needed for allergies or rhinitis. 03/13/24  Yes Newlin, Enobong, MD  furosemide  (LASIX ) 20 MG tablet Take 1 tablet by mouth once daily 05/30/24  Yes Newlin, Enobong, MD  lisinopril -hydrochlorothiazide  (ZESTORETIC ) 20-25 MG tablet Take 1 tablet by mouth daily. 04/17/24  Yes [provider]  omeprazole  (PRILOSEC) 40 MG capsule Take 1  capsule (40 mg total) by mouth daily. 01/17/24  Yes Newlin, Enobong, MD  potassium chloride  SA (KLOR-CON  M) 20 MEQ tablet Take 1 tablet by mouth once daily 05/21/24  Yes Newlin, Enobong, MD  sildenafil  (VIAGRA ) 50 MG tablet TAKE 1 TABLET BY MOUTH ONCE DAILY AS NEEDED FOR ERECTILE DYSFUNCTION AT  LEAST  24  HOURS  BETWEEN  DOSES Patient taking differently: Take 50 mg by mouth as needed for erectile dysfunction. 08/17/23  Yes Newlin, Enobong, MD  cilostazol  (PLETAL ) 100 MG tablet Take 1 tablet (100 mg total) by mouth 2 (two) times daily before a meal. Patient not taking: Reported on 06/26/2024 02/13/24   Gretta Lonni PARAS, MD  ferrous sulfate  325 (65 FE) MG tablet Take 1 tablet (325 mg total) by mouth daily. Patient not taking: Reported on 06/26/2024 07/06/23   Yolande Lamar BROCKS, MD  polyethylene glycol (MIRALAX ) 17 g packet Take 17 g by mouth daily. Patient not taking: Reported on 06/26/2024 07/06/23   Yolande Lamar BROCKS, MD    Current Facility-Administered Medications  Medication Dose Route Frequency Provider Last Rate Last Admin   acetaminophen  (TYLENOL ) tablet 650 mg  650 mg Oral Q6H PRN Opyd, Timothy S, MD       Or   acetaminophen  (TYLENOL ) suppository 650 mg  650 mg Rectal Q6H PRN Opyd, Timothy S, MD       atorvastatin  (LIPITOR) tablet 20 mg  20 mg Oral Daily Opyd, Timothy S, MD   20 mg at 06/27/24 0901   cholecalciferol  (VITAMIN D3) 10 MCG (400 UNIT) tablet 400 Units  400 Units Oral Daily Rashid, Farhan, MD       cyclobenzaprine (FLEXERIL) tablet 5 mg  5 mg Oral TID PRN Rashid, Farhan, MD   5 mg at 06/27/24 0901   ferrous sulfate  tablet 325 mg  325 mg Oral BID WC Rashid, Farhan, MD   325 mg at 06/27/24 0855   furosemide  (LASIX ) tablet 40 mg  40 mg Oral Daily Rashid, Farhan, MD       ondansetron  (ZOFRAN ) tablet 4 mg  4 mg Oral Q6H PRN Opyd, Timothy S, MD       Or   ondansetron  (ZOFRAN ) injection 4 mg  4 mg Intravenous Q6H PRN Opyd, Timothy S, MD       oxyCODONE  (Oxy IR/ROXICODONE ) immediate  release tablet 5-10 mg  5-10 mg Oral Q4H PRN Opyd, Timothy S, MD   5 mg at 06/27/24 0226   pantoprazole  (PROTONIX ) EC tablet 40 mg  40 mg Oral BID AC Rashid, Farhan, MD       senna (SENOKOT) tablet 8.6 mg  1 tablet Oral Daily PRN Opyd, Timothy S, MD       sodium chloride  flush (NS) 0.9 % injection 3 mL  3 mL Intravenous Q12H Opyd, Timothy S, MD   3 mL at 06/26/24 2219   Facility-Administered Medications Ordered in Other Encounters  Medication Dose Route Frequency Provider Last Rate Last Admin   regadenoson (LEXISCAN) injection SOLN 0.4 mg  0.4 mg Intravenous Once Croitoru, Mihai, MD        Allergies as of 06/25/2024   (  No Known Allergies)    Family History  Problem Relation Age of Onset   Hypertension Mother    Cancer Mother    Hypertension Father    Diabetes Sister     Social History   Socioeconomic History   Marital status: Divorced    Spouse name: Not on file   Number of children: Not on file   Years of education: Not on file   Highest education level: Not on file  Occupational History   Not on file  Tobacco Use   Smoking status: Every Day    Current packs/day: 0.12    Types: Cigarettes   Smokeless tobacco: Never   Tobacco comments:    3 daily  Vaping Use   Vaping status: Never Used  Substance and Sexual Activity   Alcohol use: No    Comment: quit drinking 7 years ago after 30 years of drinking   Drug use: Yes    Types: Marijuana    Comment: last time early November 2021   Sexual activity: Not on file  Other Topics Concern   Not on file  Social History Narrative   Not on file   Social Drivers of Health   Financial Resource Strain: Low Risk  (02/06/2023)   Overall Financial Resource Strain (CARDIA)    Difficulty of Paying Living Expenses: Not hard at all  Food Insecurity: No Food Insecurity (06/26/2024)   Hunger Vital Sign    Worried About Running Out of Food in the Last Year: Never true    Ran Out of Food in the Last Year: Never true  Transportation  Needs: Patient Declined (06/26/2024)   PRAPARE - Transportation    Lack of Transportation (Medical): Patient declined    Lack of Transportation (Non-Medical): Patient declined  Physical Activity: Sufficiently Active (02/06/2023)   Exercise Vital Sign    Days of Exercise per Week: 7 days    Minutes of Exercise per Session: 30 min  Stress: No Stress Concern Present (02/06/2023)   [employer] of Occupational Health - Occupational Stress Questionnaire    Feeling of Stress : Not at all  Social Connections: Patient Declined (06/26/2024)   Social Connection and Isolation Panel    Frequency of Communication with Friends and Family: Patient declined    Frequency of Social Gatherings with Friends and Family: Patient declined    Attends Religious Services: Patient declined    Database administrator or Organizations: Patient declined    Attends Banker Meetings: Patient declined    Marital Status: Patient declined  Intimate Partner Violence: Patient Declined (06/26/2024)   Humiliation, Afraid, Rape, and Kick questionnaire    Fear of Current or Ex-Partner: Patient declined    Emotionally Abused: Patient declined    Physically Abused: Patient declined    Sexually Abused: Patient declined    Review of Systems: Pertinent positive and negative review of systems were noted in the above HPI section.  All other review of systems was otherwise negative.   Physical Exam: Vital signs in last 24 hours: Temp:  [98 F (36.7 C)-98.1 F (36.7 C)] 98 F (36.7 C) (07/03 0826) Pulse Rate:  [51-83] 51 (07/03 0826) Resp:  [16-19] 16 (07/03 0826) BP: (109-125)/(48-78) 111/69 (07/03 0826) SpO2:  [94 %-98 %] 98 % (07/03 0826) Last BM Date : 06/26/24 General:   Alert,  Well-developed, thin older African-American male pleasant and cooperative in NAD eating lunch Head:  Normocephalic and atraumatic. Eyes:  Sclera clear, no icterus.   Conjunctiva pink.  Ears:  Normal auditory acuity. Nose:  No  deformity, discharge,  or lesions. Mouth:  No deformity or lesions.   Neck:  Supple; no masses or thyromegaly. Lungs:  Clear throughout to auscultation.   No wheezes, crackles, or rhonchi.  Heart:  Regular rate and rhythm; no murmurs, clicks, rubs,  or gallops. Abdomen:  Soft,nontender, BS active,nonpalp mass or hsm.   Rectal: Not done, documented Hemoccult negative Msk:  Symmetrical without gross deformities. . Pulses:  Normal pulses noted. Extremities:  Without clubbing or edema. Neurologic:  Alert and  oriented x4;  grossly normal neurologically. Skin:  Intact without significant lesions or rashes.. Psych:  Alert and cooperative. Normal mood and affect.  Intake/Output from previous day: 07/02 0701 - 07/03 0700 In: 1223 [P.O.:1220; I.V.:3] Out: 4775 [Urine:4775] Intake/Output this shift: Total I/O In: 118 [P.O.:118] Out: 375 [Urine:375]  Lab Results: Recent Labs    06/25/24 2048 06/26/24 0517 06/26/24 1117 06/27/24 0451  WBC 5.5 4.9  --  7.8  HGB 5.0* 5.6* 7.4* 7.3*  HCT 17.6* 19.7* 24.7* 23.6*  PLT 293 295  --  337   BMET Recent Labs    06/25/24 2048 06/26/24 0517 06/27/24 0451  NA 135 134* 131*  K 3.9 3.9 3.4*  CL 109 107 99  CO2 16* 17* 21*  GLUCOSE 92 91 95  BUN 26* 21 18  CREATININE 1.94* 1.61* 1.52*  CALCIUM  8.6* 8.6* 8.9   LFT Recent Labs    06/25/24 2048  PROT 6.1*  ALBUMIN  3.1*  AST 35  ALT 24  ALKPHOS 61  BILITOT 0.5   PT/INR No results for input(s): LABPROT, INR in the last 72 hours. Hepatitis Panel No results for input(s): HEPBSAG, HCVAB, HEPAIGM, HEPBIGM in the last 72 hours.   IMPRESSION:  #40 68 year old African-American male admitted yesterday with complaints of progressive shortness of breath and fatigue, lower extremity edema.  Is in the setting of history of congestive heart failure with reduced EF 40 to 45% BNP was elevated at 1334, chest x-ray negative  Admission labs most notable for hemoglobin of 5,  microcytosis and severe iron deficiency with ferritin of 7  Reviewing his chart it looks as if he has had a chronic significant iron deficiency dating back at least to 2023, and had hemoglobin in the 7 range 2024 and as low as 6.8 during admission in February 2025.  Has been documented heme-negative on a few occasions Recent Cologuard negative  Etiology of his severe iron deficiency anemia is not clear, this would suggest intermittent chronic GI blood loss or malabsorption. Though Hemoccult is negative he should still have GI evaluation with colonoscopy and EGD, and possible capsule endoscopy if endoscopic evaluation negative  #2 peripheral arterial disease 3.  Hypertension 4.  Congestive heart failure with EF 40 to 45%  PLAN: Okay for regular diet today Patient would benefit from initiation of IV iron infusions while hospitalized, then likely will need further outpatient IV infusions to replete  Discussed colonoscopy and EGD which could be done during this admission though not tomorrow as it is a holiday.  He would also be okay for outpatient procedures but he does have issues with transportation and is not sure how he would work that out.  He would like to think about this today, offered that we could likely get his procedures done over the weekend, and if procedures to be done as an outpatient he would need to be seen at the office and scheduled.  We will reassess in  a.m.   Dabney Schanz PA-C 06/27/2024, 12:21 PM

## 2024-06-27 NOTE — Evaluation (Signed)
 Occupational Therapy Evaluation and Discharge Patient Details Name: Martin Mason MRN: [id number] DOB: 1956-09-17 Today's Date: 06/27/2024   History of Present Illness   Pt is a 68 year old man admitted on 06/25/24 with shortness of breath, weakenss, LE edema and hypotension. + acute on chronic anemia with hgb of 5.6 and AKI. Received blood transfusion. PMH: COPD, CHF, HTN, PVD, alcohol use in remission.     Clinical Impressions Pt lives with his 2 brothers. He is typically independent in mobility and self care and works together with his brothers on IADLs. Pt presents with generalized weakness and requires set up to supervision for ADLs and ADL transfers within his room/bathroom. Pt likely to return to PLOF with resolving of medical conditions. Recommend ADLs with nursing staff and use of shower seat when he returns home if weakness persists. No further acute OT needs.     If plan is discharge home, recommend the following:   Assistance with cooking/housework;Assist for transportation     Functional Status Assessment   Patient has had a recent decline in their functional status and demonstrates the ability to make significant improvements in function in a reasonable and predictable amount of time.     Equipment Recommendations   None recommended by OT     Recommendations for Other Services         Precautions/Restrictions   Precautions Precautions: Fall Recall of Precautions/Restrictions: Intact Restrictions Weight Bearing Restrictions Per Provider Order: No     Mobility Bed Mobility Overal bed mobility: Modified Independent                  Transfers Overall transfer level: Needs assistance Equipment used: None Transfers: Sit to/from Stand Sit to Stand: Supervision                  Balance Overall balance assessment: Mild deficits observed, not formally tested                                         ADL either performed or  assessed with clinical judgement   ADL Overall ADL's : Needs assistance/impaired Eating/Feeding: Independent   Grooming: Wash/dry hands;Standing;Supervision/safety   Upper Body Bathing: Set up;Sitting   Lower Body Bathing: Supervison/ safety;Sit to/from stand   Upper Body Dressing : Set up;Sitting   Lower Body Dressing: Supervision/safety;Sit to/from stand   Toilet Transfer: Supervision/safety           Functional mobility during ADLs: Supervision/safety       Vision Ability to See in Adequate Light: 0 Adequate Patient Visual Report: No change from baseline       Perception         Praxis         Pertinent Vitals/Pain Pain Assessment Pain Assessment: No/denies pain     Extremity/Trunk Assessment Upper Extremity Assessment Upper Extremity Assessment: Overall WFL for tasks assessed;Right hand dominant   Lower Extremity Assessment Lower Extremity Assessment: Defer to PT evaluation       Communication Communication Communication: Impaired Factors Affecting Communication: Hearing impaired   Cognition Arousal: Alert Behavior During Therapy: WFL for tasks assessed/performed Cognition: No apparent impairments                               Following commands: Intact       Cueing  General Comments  Cueing Techniques: Verbal cues      Exercises     Shoulder Instructions      Home Living Family/patient expects to be discharged to:: Private residence Living Arrangements: Other relatives (2 brothers) Available Help at Discharge: Family Type of Home: House Home Access: Stairs to enter Secretary/administrator of Steps: 3 Entrance Stairs-Rails: Right Home Layout: One level     Bathroom Shower/Tub: Chief Strategy Officer: Standard     Home Equipment: Cane - single point;Shower Counsellor (2 wheels)          Prior Functioning/Environment Prior Level of Function : Independent/Modified Independent                ADLs Comments: works together with brothers on meal prep and housekeeping    OT Problem List:     OT Treatment/Interventions:        OT Goals(Current goals can be found in the care plan section)       OT Frequency:       Co-evaluation              AM-PAC OT 6 Clicks Daily Activity     Outcome Measure Help from another person eating meals?: None Help from another person taking care of personal grooming?: A Little Help from another person toileting, which includes using toliet, bedpan, or urinal?: A Little Help from another person bathing (including washing, rinsing, drying)?: A Little Help from another person to put on and taking off regular upper body clothing?: None Help from another person to put on and taking off regular lower body clothing?: A Little 6 Click Score: 20   End of Session Equipment Utilized During Treatment: Gait belt  Activity Tolerance: Patient tolerated treatment well Patient left: in bed;with call bell/phone within reach  OT Visit Diagnosis: Unsteadiness on feet (R26.81)                Time: 8960-8945 OT Time Calculation (min): 15 min Charges:  OT General Charges $OT Visit: 1 Visit OT Evaluation $OT Eval Low Complexity: 1 Low  Mliss HERO, OTR/L Acute Rehabilitation Services Office: [phone]   Kennth Mliss Helling 06/27/2024, 11:33 AM

## 2024-06-27 NOTE — TOC CM/SW Note (Signed)
 Transition of Care [facility]) - Inpatient Brief Assessment   Patient Details  Name: Martin Mason MRN: [id number] Date of Birth: 09/16/1956  Transition of Care [facility] CM/SW Contact:    Tom-Johnson, Harvest Muskrat, RN Phone Number: 06/27/2024, 1:23 PM   Clinical Narrative:  Patient presented to the ED with worsening Shortness Of Breath, Increased Leg swelling and Fatigue. Hgb on admit noted at 5, 1U PRBC given, today hgb at 7.3. Admitted with Symptomatic Anemia. Has hx of PAD, HLD, HTN, chronic HF, Iron deficiency Anemia and ETOH.    From home with his brothers, modified independent with his care. Has necessary DME's at home. PCP is Newlin, Enobong, MD and uses [facility] on [address].   Home health recommended, patient states he has not preference. CM called in referral to [facility] and Amy voiced acceptance, info on AVS.   Patient not Medically ready for discharge.  CM will continue to follow as patient progresses with care towards discharge.                 Transition of Care Asessment: Insurance and Status: Insurance coverage has been reviewed Patient has primary care physician: Yes Home environment has been reviewed: Yes Prior level of function:: Modified Independent Prior/Current Home Services: No current home services Social Drivers of Health Review: SDOH reviewed no interventions necessary Readmission risk has been reviewed: Yes Transition of care needs: transition of care needs identified, TOC will continue to follow

## 2024-06-27 NOTE — Evaluation (Signed)
 Physical Therapy Evaluation Patient Details Name: Martin Mason MRN: [id number] DOB: Apr 26, 1956 Today's Date: 06/27/2024  History of Present Illness  Pt is a 68 year old man admitted on 06/25/24 with shortness of breath, weakenss, LE edema and hypotension. + acute on chronic anemia with hgb of 5.6 and AKI. Received blood transfusion. PMH: COPD, CHF, HTN, PVD, alcohol use in remission.  Clinical Impression  PTA, pt lives with his two brothers and is independent. Pt presents with generalized weakness and decreased activity tolerance. Pt able to complete ADL's and ambulation at a supervision level, although is limited in total distance ambulated ~75 ft due to fatigue. Would benefit from HHPT follow up to address deficits and maximize functional independence.       If plan is discharge home, recommend the following: Assistance with cooking/housework;Assist for transportation;Help with stairs or ramp for entrance   Can travel by private vehicle        Equipment Recommendations None recommended by PT  Recommendations for Other Services       Functional Status Assessment Patient has had a recent decline in their functional status and demonstrates the ability to make significant improvements in function in a reasonable and predictable amount of time.     Precautions / Restrictions Precautions Precautions: Fall Recall of Precautions/Restrictions: Intact Restrictions Weight Bearing Restrictions Per Provider Order: No      Mobility  Bed Mobility Overal bed mobility: Modified Independent                  Transfers Overall transfer level: Needs assistance Equipment used: None Transfers: Sit to/from Stand Sit to Stand: Supervision                Ambulation/Gait Ambulation/Gait assistance: Supervision Gait Distance (Feet): 75 Feet Assistive device: None Gait Pattern/deviations: Step-through pattern, Decreased stride length Gait velocity: decreased Gait velocity  interpretation: <1.8 ft/sec, indicate of risk for recurrent falls   General Gait Details: Slowed pace, decreased bilateral step length, and reciprocal arm swing. No gross unsteadiness  Stairs            Wheelchair Mobility     Tilt Bed    Modified Rankin (Stroke Patients Only)       Balance Overall balance assessment: Mild deficits observed, not formally tested                                           Pertinent Vitals/Pain Pain Assessment Pain Assessment: No/denies pain    Home Living Family/patient expects to be discharged to:: Private residence Living Arrangements: Other relatives (2 brothers) Available Help at Discharge: Family Type of Home: House Home Access: Stairs to enter Entrance Stairs-Rails: Right Entrance Stairs-Number of Steps: 3   Home Layout: One level Home Equipment: Cane - single point;Shower Counsellor (2 wheels)      Prior Function Prior Level of Function : Independent/Modified Independent               ADLs Comments: works together with brothers on meal prep and housekeeping     Extremity/Trunk Assessment   Upper Extremity Assessment Upper Extremity Assessment: Overall WFL for tasks assessed    Lower Extremity Assessment Lower Extremity Assessment: Generalized weakness    Cervical / Trunk Assessment Cervical / Trunk Assessment: Normal  Communication   Communication Communication: Impaired Factors Affecting Communication: Hearing impaired    Cognition Arousal: Alert Behavior During Therapy:  WFL for tasks assessed/performed   PT - Cognitive impairments: No apparent impairments                         Following commands: Intact       Cueing Cueing Techniques: Verbal cues     General Comments      Exercises     Assessment/Plan    PT Assessment Patient needs continued PT services  PT Problem List Decreased strength;Decreased activity tolerance;Decreased balance;Decreased  mobility       PT Treatment Interventions DME instruction;Gait training;Stair training;Therapeutic exercise;Therapeutic activities;Functional mobility training;Balance training;Patient/family education    PT Goals (Current goals can be found in the Care Plan section)  Acute Rehab PT Goals Patient Stated Goal: 930 PT Goal Formulation: With patient Time For Goal Achievement: 07/11/24 Potential to Achieve Goals: Good    Frequency Min 2X/week     Co-evaluation               AM-PAC PT 6 Clicks Mobility  Outcome Measure Help needed turning from your back to your side while in a flat bed without using bedrails?: None Help needed moving from lying on your back to sitting on the side of a flat bed without using bedrails?: None Help needed moving to and from a bed to a chair (including a wheelchair)?: A Little Help needed standing up from a chair using your arms (e.g., wheelchair or bedside chair)?: A Little Help needed to walk in hospital room?: A Little Help needed climbing 3-5 steps with a railing? : A Little 6 Click Score: 20    End of Session   Activity Tolerance: Patient tolerated treatment well Patient left: in bed;with call bell/phone within reach Nurse Communication: Mobility status PT Visit Diagnosis: Unsteadiness on feet (R26.81);Difficulty in walking, not elsewhere classified (R26.2)    Time: 9069-9047 PT Time Calculation (min) (ACUTE ONLY): 22 min   Charges:   PT Evaluation $PT Eval Low Complexity: 1 Low   PT General Charges $$ ACUTE PT VISIT: 1 Visit         Aleck Daring, PT, DPT Acute Rehabilitation Services Office [phone]   Alayne ONEIDA Daring 06/27/2024, 12:03 PM

## 2024-06-27 NOTE — Progress Notes (Signed)
 PROGRESS NOTE    Martin Mason  FMW:[id number] DOB: 1956/09/08 DOA: 06/25/2024 PCP: Delbert Clam, MD   Brief Narrative:   68 y.o. male with medical history significant for hypertension, hyperlipidemia, PAD, chronic HFmrEF, iron deficiency anemia, and alcohol abuse in remission who now presents with shortness of breath, increased leg swelling, and fatigue, found to have anemia without evidence of acute bleeding and received 1 unit of PRBC. Hb stable now.   Assessment & Plan:  Principal Problem:   Symptomatic anemia Active Problems:   Acute on chronic heart failure with mildly reduced ejection fraction (HFmrEF, 41-49%) (HCC)   Essential hypertension, benign   AKI (acute kidney injury) (HCC)   68 y.o. male with medical history significant for hypertension, hyperlipidemia, PAD, chronic HFmrEF, iron deficiency anemia, and alcohol abuse in remission who now presents with shortness of breath, increased leg swelling, and fatigue, found to have anemia without evidence of acute bleeding and received 1 unit of PRBC on 7/2.  Acute blood loss anemia/symptomatic anemia, POA: Hemoglobin of 5 upon arrival.  Hemoglobin was 8.5 in February 2025. Status post 1 unit of PRBC.  Repeat Hb is stable. Follow-up H&H closely. There is no evidence of active bleeding.  Fecal occult blood testing is negative. Iron studies showed severe iron deficiency anemia Patient doesn't remember having a colonoscopy. Holding aspirin  Continue with protonix  GI consulted as patient needs EGD and colonoscopy.  Acute kidney injury with non-anion gap metabolic acidosis, POA: Improving. Continue to hold lisinopril  and follow-up clinically closely.  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  Echocardiogram done in March 2025 showed EF of 45 to 50%. Fluid restriction, heart healthy diet  Essential hypertension, POA: Blood pressure on the lower side so we will avoid any scheduled  antihypertensives.  COPD, POA: Prior CT chest in March 2025 show evidence of emphysema along with pulmonary nodules.  Patient will need outpatient follow-up with pulmonology.  Continue with as needed inhalation bronchodilator therapy.  Lower extremity swelling: Ruled out DVT, improved with diuretics  Vitamin D  deficiency, POA: Continue vitamin D  supplementation  Disposition: Lives at home with his brothers.   DVT prophylaxis: SCDs Start: 06/26/24 0145     Code Status: Full Code Family Communication:   Status is: Inpatient Remains inpatient appropriate because: Acute blood loss anemia    Subjective:  Complaining of lower extremity spasms overnight, better now. He did mention to me having a dark bowel movement last night.  Examination:  General exam: Appears calm and comfortable  Respiratory system: Clear to auscultation. Respiratory effort normal. Cardiovascular system: S1 & S2 heard, RRR. No JVD, murmurs, rubs, gallops or clicks. No pedal edema. Gastrointestinal system: Abdomen is nondistended, soft and nontender. No organomegaly or masses felt. Normal bowel sounds heard. Central nervous system: Alert and oriented. No focal neurological deficits. Extremities: Symmetric 5 x 5 power. Skin: No rashes, lesions or ulcers Psychiatry: Judgement and insight appear normal. Mood & affect appropriate.      Diet Orders (From admission, onward)     Start     Ordered   06/26/24 0145  Diet Heart Room service appropriate? Yes; Fluid consistency: Thin  Diet effective now       Question Answer Comment  Room service appropriate? Yes   Fluid consistency: Thin      06/26/24 0146            Objective: Vitals:   06/26/24 1638 06/26/24 2121 06/27/24 0457 06/27/24 0826  BP: 125/78 ROLLEN)  109/48 110/63 111/69  Pulse: 83 68 66 (!) 51  Resp: 19 18 18 16   Temp: 98.1 F (36.7 C) 98 F (36.7 C) 98.1 F (36.7 C) 98 F (36.7 C)  TempSrc:    Oral  SpO2: 94% 97% 98% 98%     Intake/Output Summary (Last 24 hours) at 06/27/2024 0904 Last data filed at 06/27/2024 9165 Gross per 24 hour  Intake 1341 ml  Output 4775 ml  Net -3434 ml   There were no vitals filed for this visit.  Scheduled Meds:  aspirin  EC  81 mg Oral Daily   atorvastatin   20 mg Oral Daily   cholecalciferol   400 Units Oral Daily   ferrous sulfate   325 mg Oral BID WC   furosemide   40 mg Intravenous BID   pantoprazole   40 mg Oral Daily   sodium chloride  flush  3 mL Intravenous Q12H   Continuous Infusions:  Nutritional status     There is no height or weight on file to calculate BMI.  Data Reviewed:   CBC: Recent Labs  Lab 06/25/24 2048 06/26/24 0517 06/26/24 1117 06/27/24 0451  WBC 5.5 4.9  --  7.8  HGB 5.0* 5.6* 7.4* 7.3*  HCT 17.6* 19.7* 24.7* 23.6*  MCV 68.5* 68.9*  --  68.4*  PLT 293 295  --  337   Basic Metabolic Panel: Recent Labs  Lab 06/25/24 2048 06/26/24 0517 06/27/24 0451  NA 135 134* 131*  K 3.9 3.9 3.4*  CL 109 107 99  CO2 16* 17* 21*  GLUCOSE 92 91 95  BUN 26* 21 18  CREATININE 1.94* 1.61* 1.52*  CALCIUM  8.6* 8.6* 8.9  MG  --  2.2  --    GFR: CrCl cannot be calculated (Unknown ideal weight.). Liver Function Tests: Recent Labs  Lab 06/25/24 2048  AST 35  ALT 24  ALKPHOS 61  BILITOT 0.5  PROT 6.1*  ALBUMIN  3.1*   No results for input(s): LIPASE, AMYLASE in the last 168 hours. No results for input(s): AMMONIA in the last 168 hours. Coagulation Profile: No results for input(s): INR, PROTIME in the last 168 hours. Cardiac Enzymes: No results for input(s): CKTOTAL, CKMB, CKMBINDEX, TROPONINI in the last 168 hours. BNP (last 3 results) Recent Labs    01/17/24 1447  PROBNP 2,597*   HbA1C: No results for input(s): HGBA1C in the last 72 hours. CBG: No results for input(s): GLUCAP in the last 168 hours. Lipid Profile: No results for input(s): CHOL, HDL, LDLCALC, TRIG, CHOLHDL, LDLDIRECT in the last 72  hours. Thyroid Function Tests: No results for input(s): TSH, T4TOTAL, FREET4, T3FREE, THYROIDAB in the last 72 hours. Anemia Panel: Recent Labs    06/26/24 0043  VITAMINB12 347  FOLATE 15.8  FERRITIN 7*  TIBC 378  IRON 17*  RETICCTPCT 1.4   Sepsis Labs: No results for input(s): PROCALCITON, LATICACIDVEN in the last 168 hours.  No results found for this or any previous visit (from the past 240 hours).       Radiology Studies: VAS US  LOWER EXTREMITY VENOUS (DVT) Result Date: 06/26/2024  Lower Venous DVT Study Patient Name:  JOHNJOSEPH ROLFE  Date of Exam:   06/26/2024 Medical Rec #: [id number]    Accession #:    [id number] Date of Birth: 1956-06-03   Patient Gender: M Patient Age:   41 years Exam Location:  [facility] Procedure:      VAS US  LOWER EXTREMITY VENOUS (DVT) Referring Phys: TIMOTHY OPYD --------------------------------------------------------------------------------  Indications: Swelling.  Limitations: Patient positioning (lying on right side). Comparison Study: Previous exam on 07/06/2023 was negative for DVT Performing Technologist: Ezzie Potters RVT, RDMS  Examination Guidelines: A complete evaluation includes B-mode imaging, spectral Doppler, color Doppler, and power Doppler as needed of all accessible portions of each vessel. Bilateral testing is considered an integral part of a complete examination. Limited examinations for reoccurring indications may be performed as noted. The reflux portion of the exam is performed with the patient in reverse Trendelenburg.  +---------+---------------+---------+-----------+----------+--------------+ RIGHT    CompressibilityPhasicitySpontaneityPropertiesThrombus Aging +---------+---------------+---------+-----------+----------+--------------+ CFV      Full           Yes      Yes                                 +---------+---------------+---------+-----------+----------+--------------+ SFJ      Full                                                         +---------+---------------+---------+-----------+----------+--------------+ FV Prox  Full           Yes      Yes                                 +---------+---------------+---------+-----------+----------+--------------+ FV Mid   Full           Yes      Yes                                 +---------+---------------+---------+-----------+----------+--------------+ FV DistalFull           Yes      Yes                                 +---------+---------------+---------+-----------+----------+--------------+ PFV      Full                                                        +---------+---------------+---------+-----------+----------+--------------+ POP      Full           Yes      Yes                                 +---------+---------------+---------+-----------+----------+--------------+ PTV      Full                                                        +---------+---------------+---------+-----------+----------+--------------+ PERO     Full                                                        +---------+---------------+---------+-----------+----------+--------------+   +----+---------------+---------+-----------+----------+--------------+  LEFTCompressibilityPhasicitySpontaneityPropertiesThrombus Aging +----+---------------+---------+-----------+----------+--------------+ CFV Full           Yes      Yes                                 +----+---------------+---------+-----------+----------+--------------+     Summary: RIGHT: - There is no evidence of deep vein thrombosis in the lower extremity.  - No cystic structure found in the popliteal fossa. Subcutaneous edema of calf and ankle.  LEFT: - No evidence of common femoral vein obstruction.  - Ultrasound characteristics of enlarged lymph nodes noted in the groin.  *See table(s) above for measurements and observations. Electronically signed by Gaile New MD  on 06/26/2024 at 1:43:58 PM.    Final    DG Chest Port 1 View Result Date: 06/25/2024 CLINICAL DATA:  Shortness of breath. EXAM: PORTABLE CHEST 1 VIEW COMPARISON:  Chest CT dated 02/03/2024. FINDINGS: No focal consolidation, pleural effusion or pneumothorax. Mild cardiomegaly. No acute osseous pathology. IMPRESSION: 1. No active disease. 2. Mild cardiomegaly. Electronically Signed   By: Vanetta Chou M.D.   On: 06/25/2024 21:44       LOS: 2 days   Time spent= 41 mins    Deliliah Room, MD [facility]  If 7PM-7AM, please contact night-coverage  06/27/2024, 9:04 AM

## 2024-06-28 LAB — IMMUNOGLOBULINS A/E/G/M, SERUM
IgA: 396 mg/dL (ref 61–437)
IgE (Immunoglobulin E), Serum: 3614 [IU]/mL — ABNORMAL HIGH (ref 6–495)
IgG (Immunoglobin G), Serum: 1160 mg/dL (ref 603–1613)
IgM (Immunoglobulin M), Srm: 82 mg/dL (ref 20–172)

## 2024-06-28 LAB — CBC
HCT: 25.2 % — ABNORMAL LOW (ref 39.0–52.0)
Hemoglobin: 7.7 g/dL — ABNORMAL LOW (ref 13.0–17.0)
MCH: 21 pg — ABNORMAL LOW (ref 26.0–34.0)
MCHC: 30.6 g/dL (ref 30.0–36.0)
MCV: 68.7 fL — ABNORMAL LOW (ref 80.0–100.0)
Platelets: 349 K/uL (ref 150–400)
RBC: 3.67 MIL/uL — ABNORMAL LOW (ref 4.22–5.81)
RDW: 22.1 % — ABNORMAL HIGH (ref 11.5–15.5)
WBC: 9.3 K/uL (ref 4.0–10.5)
nRBC: 1 % — ABNORMAL HIGH (ref 0.0–0.2)

## 2024-06-28 LAB — PROTEIN ELECTROPHORESIS, SERUM
A/G Ratio: 1 (ref 0.7–1.7)
Albumin ELP: 3.1 g/dL (ref 2.9–4.4)
Alpha-1-Globulin: 0.2 g/dL (ref 0.0–0.4)
Alpha-2-Globulin: 0.6 g/dL (ref 0.4–1.0)
Beta Globulin: 1.1 g/dL (ref 0.7–1.3)
Gamma Globulin: 1.2 g/dL (ref 0.4–1.8)
Globulin, Total: 3.1 g/dL (ref 2.2–3.9)
M-Spike, %: 0.7 g/dL — ABNORMAL HIGH
Total Protein ELP: 6.2 g/dL (ref 6.0–8.5)

## 2024-06-28 LAB — BASIC METABOLIC PANEL WITH GFR
Anion gap: 8 (ref 5–15)
BUN: 24 mg/dL — ABNORMAL HIGH (ref 8–23)
CO2: 22 mmol/L (ref 22–32)
Calcium: 8.9 mg/dL (ref 8.9–10.3)
Chloride: 99 mmol/L (ref 98–111)
Creatinine, Ser: 1.81 mg/dL — ABNORMAL HIGH (ref 0.61–1.24)
GFR, Estimated: 40 mL/min — ABNORMAL LOW (ref >60)
Glucose, Bld: 101 mg/dL — ABNORMAL HIGH (ref 70–99)
Potassium: 3.9 mmol/L (ref 3.5–5.1)
Sodium: 129 mmol/L — ABNORMAL LOW (ref 135–145)

## 2024-06-28 NOTE — H&P (View-Only) (Signed)
    Gastroenterology Inpatient Follow Up    Subjective: Denies any blood in the stools.  Objective: Vital signs in last 24 hours: Temp:  [97.9 F (36.6 C)-98.7 F (37.1 C)] 97.9 F (36.6 C) (07/04 0735) Pulse Rate:  [65-88] 65 (07/04 0735) Resp:  [18-19] 18 (07/04 0735) BP: (106-123)/(49-76) 111/69 (07/04 0735) SpO2:  [79 %-100 %] 98 % (07/03 1956) Last BM Date : 06/26/24  Intake/Output from previous day: 07/03 0701 - 07/04 0700 In: 918 [P.O.:918] Out: 2275 [Urine:2275] Intake/Output this shift: Total I/O In: -  Out: 550 [Urine:550]  General appearance: alert and cooperative Resp: No increased work of breathing Cardio: Regular rate GI: Nontender, nondistended  Lab Results: Recent Labs    06/26/24 0517 06/26/24 1117 06/27/24 0451 06/28/24 0427  WBC 4.9  --  7.8 9.3  HGB 5.6* 7.4* 7.3* 7.7*  HCT 19.7* 24.7* 23.6* 25.2*  PLT 295  --  337 349   BMET Recent Labs    06/26/24 0517 06/27/24 0451 06/28/24 0427  NA 134* 131* 129*  K 3.9 3.4* 3.9  CL 107 99 99  CO2 17* 21* 22  GLUCOSE 91 95 101*  BUN 21 18 24*  CREATININE 1.61* 1.52* 1.81*  CALCIUM  8.6* 8.9 8.9   LFT Recent Labs    06/25/24 2048  PROT 6.1*  ALBUMIN  3.1*  AST 35  ALT 24  ALKPHOS 61  BILITOT 0.5   PT/INR No results for input(s): LABPROT, INR in the last 72 hours. Hepatitis Panel No results for input(s): HEPBSAG, HCVAB, HEPAIGM, HEPBIGM in the last 72 hours. C-Diff No results for input(s): CDIFFTOX in the last 72 hours.  Studies/Results: No results found.  Medications: I have reviewed the patient's current medications. Scheduled:  atorvastatin   20 mg Oral Daily   cholecalciferol   400 Units Oral Daily   ferrous sulfate   325 mg Oral BID WC   Na Sulfate-K Sulfate-Mg Sulfate concentrate  0.5 kit Oral Once   Followed by   NOREEN ON 06/29/2024] Na Sulfate-K Sulfate-Mg Sulfate concentrate  0.5 kit Oral Once   pantoprazole   40 mg Oral BID AC   simethicone   240 mg Oral  Once   Followed by   [START ON 06/29/2024] simethicone   240 mg Oral Once   sodium chloride  flush  3 mL Intravenous Q12H   Continuous: PRN:acetaminophen  **OR** acetaminophen , cyclobenzaprine , ondansetron  **OR** ondansetron  (ZOFRAN ) IV, oxyCODONE , senna  Assessment/Plan: 68 year old male admitted with symptomatic iron deficiency anemia in the setting of CHF. Hemoglobin of fives, severe IDA. This is a chronic issue ongoing for a few years now without prior endoscopic evaluation. He did have a recent negative Cologuard. No overt GI bleeding.  Blood counts today are stable with hemoglobin of 7.7 today from 7.3 yesterday. Plan is for EGD and colonoscopy tomorrow for further evaluation of his longstanding IDA.  Patient is agreeable to this procedure.  Prep and diet orders in.   LOS: 3 days   Rosario JAYSON Kidney 06/28/2024, 2:00 PM

## 2024-06-28 NOTE — Progress Notes (Addendum)
    Gastroenterology Inpatient Follow Up    Subjective: Denies any blood in the stools.  Objective: Vital signs in last 24 hours: Temp:  [97.9 F (36.6 C)-98.7 F (37.1 C)] 97.9 F (36.6 C) (07/04 0735) Pulse Rate:  [65-88] 65 (07/04 0735) Resp:  [18-19] 18 (07/04 0735) BP: (106-123)/(49-76) 111/69 (07/04 0735) SpO2:  [79 %-100 %] 98 % (07/03 1956) Last BM Date : 06/26/24  Intake/Output from previous day: 07/03 0701 - 07/04 0700 In: 918 [P.O.:918] Out: 2275 [Urine:2275] Intake/Output this shift: Total I/O In: -  Out: 550 [Urine:550]  General appearance: alert and cooperative Resp: No increased work of breathing Cardio: Regular rate GI: Nontender, nondistended  Lab Results: Recent Labs    06/26/24 0517 06/26/24 1117 06/27/24 0451 06/28/24 0427  WBC 4.9  --  7.8 9.3  HGB 5.6* 7.4* 7.3* 7.7*  HCT 19.7* 24.7* 23.6* 25.2*  PLT 295  --  337 349   BMET Recent Labs    06/26/24 0517 06/27/24 0451 06/28/24 0427  NA 134* 131* 129*  K 3.9 3.4* 3.9  CL 107 99 99  CO2 17* 21* 22  GLUCOSE 91 95 101*  BUN 21 18 24*  CREATININE 1.61* 1.52* 1.81*  CALCIUM  8.6* 8.9 8.9   LFT Recent Labs    06/25/24 2048  PROT 6.1*  ALBUMIN  3.1*  AST 35  ALT 24  ALKPHOS 61  BILITOT 0.5   PT/INR No results for input(s): LABPROT, INR in the last 72 hours. Hepatitis Panel No results for input(s): HEPBSAG, HCVAB, HEPAIGM, HEPBIGM in the last 72 hours. C-Diff No results for input(s): CDIFFTOX in the last 72 hours.  Studies/Results: No results found.  Medications: I have reviewed the patient's current medications. Scheduled:  atorvastatin   20 mg Oral Daily   cholecalciferol   400 Units Oral Daily   ferrous sulfate   325 mg Oral BID WC   Na Sulfate-K Sulfate-Mg Sulfate concentrate  0.5 kit Oral Once   Followed by   NOREEN ON 06/29/2024] Na Sulfate-K Sulfate-Mg Sulfate concentrate  0.5 kit Oral Once   pantoprazole   40 mg Oral BID AC   simethicone   240 mg Oral  Once   Followed by   [START ON 06/29/2024] simethicone   240 mg Oral Once   sodium chloride  flush  3 mL Intravenous Q12H   Continuous: PRN:acetaminophen  **OR** acetaminophen , cyclobenzaprine , ondansetron  **OR** ondansetron  (ZOFRAN ) IV, oxyCODONE , senna  Assessment/Plan: 68 year old male admitted with symptomatic iron deficiency anemia in the setting of CHF. Hemoglobin of fives, severe IDA. This is a chronic issue ongoing for a few years now without prior endoscopic evaluation. He did have a recent negative Cologuard. No overt GI bleeding.  Blood counts today are stable with hemoglobin of 7.7 today from 7.3 yesterday. Plan is for EGD and colonoscopy tomorrow for further evaluation of his longstanding IDA.  Patient is agreeable to this procedure.  Prep and diet orders in.   LOS: 3 days   Martin Mason 06/28/2024, 2:00 PM

## 2024-06-28 NOTE — Plan of Care (Signed)
  Problem: Education: Goal: Knowledge of General Education information will improve Description: Including pain rating scale, medication(s)/side effects and non-pharmacologic comfort measures Outcome: Progressing   Problem: Health Behavior/Discharge Planning: Goal: Ability to manage health-related needs will improve Outcome: Progressing   Problem: Clinical Measurements: Goal: Ability to maintain clinical measurements within normal limits will improve Outcome: Progressing Goal: Will remain free from infection Outcome: Progressing Goal: Diagnostic test results will improve Outcome: Progressing Goal: Respiratory complications will improve Outcome: Progressing Goal: Cardiovascular complication will be avoided Outcome: Progressing   Problem: Activity: Goal: Risk for activity intolerance will decrease Outcome: Progressing   Problem: Nutrition: Goal: Adequate nutrition will be maintained Outcome: Progressing   Problem: Coping: Goal: Level of anxiety will decrease Outcome: Progressing   Problem: Elimination: Goal: Will not experience complications related to bowel motility Outcome: Progressing Goal: Will not experience complications related to urinary retention Outcome: Progressing   Problem: Pain Managment: Goal: General experience of comfort will improve and/or be controlled Outcome: Progressing   Problem: Safety: Goal: Ability to remain free from injury will improve Outcome: Progressing   Problem: Skin Integrity: Goal: Risk for impaired skin integrity will decrease Outcome: Progressing   Problem: Education: Goal: Ability to demonstrate management of disease process will improve Outcome: Progressing Goal: Ability to verbalize understanding of medication therapies will improve Outcome: Progressing Goal: Individualized Educational Video(s) Outcome: Progressing   Problem: Activity: Goal: Capacity to carry out activities will improve Outcome: Progressing    Problem: Cardiac: Goal: Ability to achieve and maintain adequate cardiopulmonary perfusion will improve Outcome: Progressing   Problem: Activity: Goal: Capacity to carry out activities will improve Outcome: Progressing

## 2024-06-28 NOTE — Progress Notes (Signed)
 PROGRESS NOTE    Martin Mason  FMW:[id number] DOB: [birth year] DOA: 06/25/2024 PCP: Delbert Clam, MD   Brief Narrative:   68 y.o. male with medical history significant for hypertension, hyperlipidemia, PAD, chronic HFmrEF, iron deficiency anemia, and alcohol abuse in remission who now presents with shortness of breath, increased leg swelling, and fatigue, found to have anemia without evidence of acute bleeding and received 1 unit of PRBC. Hb stable now.   Assessment & Plan:  Principal Problem:   Symptomatic anemia Active Problems:   Acute on chronic heart failure with mildly reduced ejection fraction (HFmrEF, 41-49%) (HCC)   Essential hypertension, benign   AKI (acute kidney injury) (HCC)   68 y.o. male with medical history significant for hypertension, hyperlipidemia, PAD, chronic HFmrEF, iron deficiency anemia, and alcohol abuse in remission who now presents with shortness of breath, increased leg swelling, and fatigue, found to have anemia without evidence of acute bleeding and received 1 unit of PRBC on 7/2.  Acute blood loss anemia/Possible GI Bleed/symptomatic anemia, POA: Hemoglobin of 5 upon arrival.  Hemoglobin was 8.5 in February 2025. Status post 1 unit of PRBC.  Repeat Hb is stable. Follow-up H&H closely. There is no evidence of active bleeding.  Fecal occult blood testing is negative. Iron studies showed severe iron deficiency anemia Patient doesn't remember having a colonoscopy. Holding aspirin  Continue with protonix  GI consulted. Patient is agreeable to EGD and Colonoscopy.   Acute kidney injury with non-anion gap metabolic acidosis, POA: Continue to hold lisinopril  and follow-up clinically closely. Held lasix  on 7/4 as no evidence of volume overload and creatinine was up trending. F/u BMP in am.  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  Echocardiogram done in March 2025 showed EF of 45 to 50%. Fluid restriction,  heart healthy diet  Essential hypertension, POA: Blood pressure on the lower side so we will avoid any scheduled antihypertensives.  COPD, POA: Prior CT chest in March 2025 show evidence of emphysema along with pulmonary nodules.  Patient will need outpatient follow-up with pulmonology.  Continue with as needed inhalation bronchodilator therapy.  Lower extremity swelling: Ruled out DVT, improved with diuretics  Vitamin D  deficiency, POA: Continue vitamin D  supplementation  Disposition: Lives at home with his brothers.   DVT prophylaxis: SCDs Start: 06/26/24 0145     Code Status: Full Code Family Communication:   Status is: Inpatient Remains inpatient appropriate because: Acute blood loss anemia    Subjective:  No acute issues overnight Agreeable to EGD and Colonoscopy No evidence of obvious active bleeding at the moment.  Examination:  General exam: Appears calm and comfortable  Respiratory system: Clear to auscultation. Respiratory effort normal. Cardiovascular system: S1 & S2 heard, RRR. No JVD, murmurs, rubs, gallops or clicks. No pedal edema. Gastrointestinal system: Abdomen is nondistended, soft and nontender. No organomegaly or masses felt. Normal bowel sounds heard. Central nervous system: Alert and oriented. No focal neurological deficits. Extremities: Symmetric 5 x 5 power. Skin: No rashes, lesions or ulcers Psychiatry: Judgement and insight appear normal. Mood & affect appropriate.      Diet Orders (From admission, onward)     Start     Ordered   06/29/24 0001  Diet NPO time specified  Diet effective midnight        06/27/24 1704   06/27/24 1705  Diet clear liquid Fluid consistency: Thin  Diet effective 0500       Question:  Fluid consistency:  Answer:  Thin  06/27/24 1704            Objective: Vitals:   06/27/24 1740 06/27/24 1956 06/28/24 0509 06/28/24 0735  BP:  123/63 (!) 106/49 111/69  Pulse: 88 70 68 65  Resp:  18 18 18   Temp:  98.1 F  (36.7 C) 98.7 F (37.1 C) 97.9 F (36.6 C)  TempSrc:  Oral    SpO2: 100% 98%      Intake/Output Summary (Last 24 hours) at 06/28/2024 0850 Last data filed at 06/28/2024 0500 Gross per 24 hour  Intake 800 ml  Output 2275 ml  Net -1475 ml   There were no vitals filed for this visit.  Scheduled Meds:  atorvastatin   20 mg Oral Daily   cholecalciferol   400 Units Oral Daily   ferrous sulfate   325 mg Oral BID WC   furosemide   40 mg Oral Daily   Na Sulfate-K Sulfate-Mg Sulfate concentrate  0.5 kit Oral Once   Followed by   NOREEN ON 06/29/2024] Na Sulfate-K Sulfate-Mg Sulfate concentrate  0.5 kit Oral Once   pantoprazole   40 mg Oral BID AC   simethicone   240 mg Oral Once   Followed by   [START ON 06/29/2024] simethicone   240 mg Oral Once   sodium chloride  flush  3 mL Intravenous Q12H   Continuous Infusions:  Nutritional status     There is no height or weight on file to calculate BMI.  Data Reviewed:   CBC: Recent Labs  Lab 06/25/24 2048 06/26/24 0517 06/26/24 1117 06/27/24 0451 06/28/24 0427  WBC 5.5 4.9  --  7.8 9.3  HGB 5.0* 5.6* 7.4* 7.3* 7.7*  HCT 17.6* 19.7* 24.7* 23.6* 25.2*  MCV 68.5* 68.9*  --  68.4* 68.7*  PLT 293 295  --  337 349   Basic Metabolic Panel: Recent Labs  Lab 06/25/24 2048 06/26/24 0517 06/27/24 0451 06/28/24 0427  NA 135 134* 131* 129*  K 3.9 3.9 3.4* 3.9  CL 109 107 99 99  CO2 16* 17* 21* 22  GLUCOSE 92 91 95 101*  BUN 26* 21 18 24*  CREATININE 1.94* 1.61* 1.52* 1.81*  CALCIUM  8.6* 8.6* 8.9 8.9  MG  --  2.2  --   --    GFR: CrCl cannot be calculated (Unknown ideal weight.). Liver Function Tests: Recent Labs  Lab 06/25/24 2048  AST 35  ALT 24  ALKPHOS 61  BILITOT 0.5  PROT 6.1*  ALBUMIN  3.1*   No results for input(s): LIPASE, AMYLASE in the last 168 hours. No results for input(s): AMMONIA in the last 168 hours. Coagulation Profile: No results for input(s): INR, PROTIME in the last 168 hours. Cardiac Enzymes: No  results for input(s): CKTOTAL, CKMB, CKMBINDEX, TROPONINI in the last 168 hours. BNP (last 3 results) Recent Labs    01/17/24 1447  PROBNP 2,597*   HbA1C: No results for input(s): HGBA1C in the last 72 hours. CBG: No results for input(s): GLUCAP in the last 168 hours. Lipid Profile: No results for input(s): CHOL, HDL, LDLCALC, TRIG, CHOLHDL, LDLDIRECT in the last 72 hours. Thyroid Function Tests: No results for input(s): TSH, T4TOTAL, FREET4, T3FREE, THYROIDAB in the last 72 hours. Anemia Panel: Recent Labs    06/26/24 0043  VITAMINB12 347  FOLATE 15.8  FERRITIN 7*  TIBC 378  IRON 17*  RETICCTPCT 1.4   Sepsis Labs: No results for input(s): PROCALCITON, LATICACIDVEN in the last 168 hours.  No results found for this or any previous visit (from the past  240 hours).       Radiology Studies: VAS US  LOWER EXTREMITY VENOUS (DVT) Result Date: 06/26/2024  Lower Venous DVT Study Patient Name:  CLAIBORNE STROBLE  Date of Exam:   06/26/2024 Medical Rec #: [id number]    Accession #:    [id number] Date of Birth: 1956/01/10   Patient Gender: M Patient Age:   64 years Exam Location:  [facility] Procedure:      VAS US  LOWER EXTREMITY VENOUS (DVT) Referring Phys: TIMOTHY OPYD --------------------------------------------------------------------------------  Indications: Swelling.  Limitations: Patient positioning (lying on right side). Comparison Study: Previous exam on 07/06/2023 was negative for DVT Performing Technologist: Ezzie Potters RVT, RDMS  Examination Guidelines: A complete evaluation includes B-mode imaging, spectral Doppler, color Doppler, and power Doppler as needed of all accessible portions of each vessel. Bilateral testing is considered an integral part of a complete examination. Limited examinations for reoccurring indications may be performed as noted. The reflux portion of the exam is performed with the patient in reverse Trendelenburg.   +---------+---------------+---------+-----------+----------+--------------+ RIGHT    CompressibilityPhasicitySpontaneityPropertiesThrombus Aging +---------+---------------+---------+-----------+----------+--------------+ CFV      Full           Yes      Yes                                 +---------+---------------+---------+-----------+----------+--------------+ SFJ      Full                                                        +---------+---------------+---------+-----------+----------+--------------+ FV Prox  Full           Yes      Yes                                 +---------+---------------+---------+-----------+----------+--------------+ FV Mid   Full           Yes      Yes                                 +---------+---------------+---------+-----------+----------+--------------+ FV DistalFull           Yes      Yes                                 +---------+---------------+---------+-----------+----------+--------------+ PFV      Full                                                        +---------+---------------+---------+-----------+----------+--------------+ POP      Full           Yes      Yes                                 +---------+---------------+---------+-----------+----------+--------------+ PTV      Full                                                        +---------+---------------+---------+-----------+----------+--------------+  PERO     Full                                                        +---------+---------------+---------+-----------+----------+--------------+   +----+---------------+---------+-----------+----------+--------------+ LEFTCompressibilityPhasicitySpontaneityPropertiesThrombus Aging +----+---------------+---------+-----------+----------+--------------+ CFV Full           Yes      Yes                                  +----+---------------+---------+-----------+----------+--------------+     Summary: RIGHT: - There is no evidence of deep vein thrombosis in the lower extremity.  - No cystic structure found in the popliteal fossa. Subcutaneous edema of calf and ankle.  LEFT: - No evidence of common femoral vein obstruction.  - Ultrasound characteristics of enlarged lymph nodes noted in the groin.  *See table(s) above for measurements and observations. Electronically signed by Gaile New MD on 06/26/2024 at 1:43:58 PM.    Final        LOS: 3 days   Time spent= 41 mins   Deliliah Room, MD [facility]  If 7PM-7AM, please contact night-coverage  06/28/2024, 8:50 AM

## 2024-06-28 NOTE — Plan of Care (Signed)
 Problem: Education: Goal: Knowledge of General Education information will improve Description: Including pain rating scale, medication(s)/side effects and non-pharmacologic comfort measures 06/28/2024 1806 by Casimir Keturah LABOR, RN Outcome: Progressing 06/28/2024 1552 by Casimir Keturah LABOR, RN Outcome: Progressing   Problem: Health Behavior/Discharge Planning: Goal: Ability to manage health-related needs will improve 06/28/2024 1806 by Casimir Keturah LABOR, RN Outcome: Progressing 06/28/2024 1552 by Casimir Keturah LABOR, RN Outcome: Progressing   Problem: Clinical Measurements: Goal: Ability to maintain clinical measurements within normal limits will improve 06/28/2024 1806 by Casimir Keturah LABOR, RN Outcome: Progressing 06/28/2024 1552 by Casimir Keturah LABOR, RN Outcome: Progressing Goal: Will remain free from infection 06/28/2024 1806 by Casimir Keturah LABOR, RN Outcome: Progressing 06/28/2024 1552 by Casimir Keturah LABOR, RN Outcome: Progressing Goal: Diagnostic test results will improve 06/28/2024 1806 by Casimir Keturah LABOR, RN Outcome: Progressing 06/28/2024 1552 by Casimir Keturah LABOR, RN Outcome: Progressing Goal: Respiratory complications will improve 06/28/2024 1806 by Casimir Keturah LABOR, RN Outcome: Progressing 06/28/2024 1552 by Casimir Keturah LABOR, RN Outcome: Progressing Goal: Cardiovascular complication will be avoided 06/28/2024 1806 by Casimir Keturah LABOR, RN Outcome: Progressing 06/28/2024 1552 by Casimir Keturah LABOR, RN Outcome: Progressing   Problem: Activity: Goal: Risk for activity intolerance will decrease 06/28/2024 1806 by Casimir Keturah LABOR, RN Outcome: Progressing 06/28/2024 1552 by Casimir Keturah LABOR, RN Outcome: Progressing   Problem: Nutrition: Goal: Adequate nutrition will be maintained 06/28/2024 1806 by Casimir Keturah LABOR, RN Outcome: Progressing 06/28/2024 1552 by Casimir Keturah LABOR, RN Outcome: Progressing   Problem: Coping: Goal: Level of anxiety will decrease 06/28/2024  1806 by Casimir Keturah LABOR, RN Outcome: Progressing 06/28/2024 1552 by Casimir Keturah LABOR, RN Outcome: Progressing   Problem: Elimination: Goal: Will not experience complications related to bowel motility 06/28/2024 1806 by Casimir Keturah LABOR, RN Outcome: Progressing 06/28/2024 1552 by Casimir Keturah LABOR, RN Outcome: Progressing Goal: Will not experience complications related to urinary retention 06/28/2024 1806 by Casimir Keturah LABOR, RN Outcome: Progressing 06/28/2024 1552 by Casimir Keturah LABOR, RN Outcome: Progressing   Problem: Pain Managment: Goal: General experience of comfort will improve and/or be controlled 06/28/2024 1806 by Casimir Keturah LABOR, RN Outcome: Progressing 06/28/2024 1552 by Casimir Keturah LABOR, RN Outcome: Progressing   Problem: Safety: Goal: Ability to remain free from injury will improve 06/28/2024 1806 by Casimir Keturah LABOR, RN Outcome: Progressing 06/28/2024 1552 by Casimir Keturah LABOR, RN Outcome: Progressing   Problem: Skin Integrity: Goal: Risk for impaired skin integrity will decrease 06/28/2024 1806 by Casimir Keturah LABOR, RN Outcome: Progressing 06/28/2024 1552 by Casimir Keturah LABOR, RN Outcome: Progressing   Problem: Education: Goal: Ability to demonstrate management of disease process will improve 06/28/2024 1806 by Casimir Keturah LABOR, RN Outcome: Progressing 06/28/2024 1552 by Casimir Keturah LABOR, RN Outcome: Progressing Goal: Ability to verbalize understanding of medication therapies will improve 06/28/2024 1806 by Casimir Keturah LABOR, RN Outcome: Progressing 06/28/2024 1552 by Casimir Keturah LABOR, RN Outcome: Progressing Goal: Individualized Educational Video(s) 06/28/2024 1806 by Casimir Keturah LABOR, RN Outcome: Progressing 06/28/2024 1552 by Casimir Keturah LABOR, RN Outcome: Progressing   Problem: Activity: Goal: Capacity to carry out activities will improve 06/28/2024 1806 by Casimir Keturah LABOR, RN Outcome: Progressing 06/28/2024 1552 by Casimir Keturah LABOR,  RN Outcome: Progressing   Problem: Cardiac: Goal: Ability to achieve and maintain adequate cardiopulmonary perfusion will improve 06/28/2024 1806 by Casimir Keturah LABOR, RN Outcome: Progressing 06/28/2024 1552 by Casimir Keturah LABOR, RN Outcome: Progressing   Problem: Activity: Goal: Capacity to carry out activities will improve 06/28/2024 1806 by  Casimir Keturah LABOR, RN Outcome: Progressing 06/28/2024 1552 by Casimir Keturah LABOR, RN Outcome: Progressing

## 2024-06-29 ENCOUNTER — Other Ambulatory Visit: Payer: Self-pay

## 2024-06-29 ENCOUNTER — Inpatient Hospital Stay (HOSPITAL_COMMUNITY): Payer: Medicare (Managed Care) | Admitting: Certified Registered Nurse Anesthetist

## 2024-06-29 ENCOUNTER — Encounter (HOSPITAL_COMMUNITY): Admission: EM | Disposition: A | Discharge status: Home Health | Payer: Self-pay | Attending: Internal Medicine

## 2024-06-29 DIAGNOSIS — K31819 Angiodysplasia of stomach and duodenum without bleeding: Secondary | ICD-10-CM

## 2024-06-29 DIAGNOSIS — K2971 Gastritis, unspecified, with bleeding: Secondary | ICD-10-CM

## 2024-06-29 DIAGNOSIS — K648 Other hemorrhoids: Secondary | ICD-10-CM

## 2024-06-29 DIAGNOSIS — I5023 Acute on chronic systolic (congestive) heart failure: Secondary | ICD-10-CM

## 2024-06-29 DIAGNOSIS — K3189 Other diseases of stomach and duodenum: Secondary | ICD-10-CM

## 2024-06-29 DIAGNOSIS — K2289 Other specified disease of esophagus: Secondary | ICD-10-CM

## 2024-06-29 DIAGNOSIS — I130 Hypertensive heart and chronic kidney disease with heart failure and stage 1 through stage 4 chronic kidney disease, or unspecified chronic kidney disease: Secondary | ICD-10-CM | POA: Diagnosis not present

## 2024-06-29 DIAGNOSIS — N1831 Chronic kidney disease, stage 3a: Secondary | ICD-10-CM

## 2024-06-29 HISTORY — PX: ESOPHAGOGASTRODUODENOSCOPY: SHX5428

## 2024-06-29 HISTORY — PX: COLONOSCOPY: SHX5424

## 2024-06-29 LAB — TYPE AND SCREEN
ABO/RH(D): O POS
Antibody Screen: POSITIVE
DAT, IgG: NEGATIVE
Donor AG Type: NEGATIVE
Donor AG Type: NEGATIVE
PT AG Type: NEGATIVE
Unit division: 0
Unit division: 0

## 2024-06-29 LAB — BASIC METABOLIC PANEL WITH GFR
Anion gap: 13 (ref 5–15)
BUN: 20 mg/dL (ref 8–23)
CO2: 21 mmol/L — ABNORMAL LOW (ref 22–32)
Calcium: 9.5 mg/dL (ref 8.9–10.3)
Chloride: 99 mmol/L (ref 98–111)
Creatinine, Ser: 1.72 mg/dL — ABNORMAL HIGH (ref 0.61–1.24)
GFR, Estimated: 42 mL/min — ABNORMAL LOW (ref >60)
Glucose, Bld: 107 mg/dL — ABNORMAL HIGH (ref 70–99)
Potassium: 3.5 mmol/L (ref 3.5–5.1)
Sodium: 133 mmol/L — ABNORMAL LOW (ref 135–145)

## 2024-06-29 LAB — CBC
HCT: 26.6 % — ABNORMAL LOW (ref 39.0–52.0)
Hemoglobin: 8 g/dL — ABNORMAL LOW (ref 13.0–17.0)
MCH: 20.8 pg — ABNORMAL LOW (ref 26.0–34.0)
MCHC: 30.1 g/dL (ref 30.0–36.0)
MCV: 69.1 fL — ABNORMAL LOW (ref 80.0–100.0)
Platelets: 371 K/uL (ref 150–400)
RBC: 3.85 MIL/uL — ABNORMAL LOW (ref 4.22–5.81)
RDW: 22.9 % — ABNORMAL HIGH (ref 11.5–15.5)
WBC: 9.2 K/uL (ref 4.0–10.5)
nRBC: 0.5 % — ABNORMAL HIGH (ref 0.0–0.2)

## 2024-06-29 LAB — BPAM RBC
Blood Product Expiration Date: 202508072359
Blood Product Expiration Date: 202508072359
ISSUE DATE / TIME: 202507020426
Unit Type and Rh: 5100
Unit Type and Rh: 5100

## 2024-06-29 SURGERY — EGD (ESOPHAGOGASTRODUODENOSCOPY)
Anesthesia: Monitor Anesthesia Care

## 2024-06-29 MED ORDER — PROPOFOL 500 MG/50ML IV EMUL
INTRAVENOUS | Status: DC | PRN
Start: 2024-06-29 — End: 2024-06-29
  Administered 2024-06-29: 150 ug/kg/min via INTRAVENOUS

## 2024-06-29 MED ORDER — SODIUM CHLORIDE 0.9 % IV SOLN
INTRAVENOUS | Status: DC | PRN
Start: 2024-06-29 — End: 2024-06-29

## 2024-06-29 MED ORDER — FLUCONAZOLE 100MG IVPB
100.0000 mg | INTRAVENOUS | Status: DC
  Filled 2024-06-29 (×2): qty 50

## 2024-06-29 MED ORDER — PHENYLEPHRINE 80 MCG/ML (10ML) SYRINGE FOR IV PUSH (FOR BLOOD PRESSURE SUPPORT)
PREFILLED_SYRINGE | INTRAVENOUS | Status: DC | PRN
Start: 2024-06-29 — End: 2024-06-29
  Administered 2024-06-29 (×2): 80 ug via INTRAVENOUS

## 2024-06-29 MED ORDER — PHENYLEPHRINE HCL-NACL 20-0.9 MG/250ML-% IV SOLN
INTRAVENOUS | Status: DC | PRN
  Administered 2024-06-29: 50 ug/min via INTRAVENOUS

## 2024-06-29 MED ORDER — PROPOFOL 10 MG/ML IV BOLUS
INTRAVENOUS | Status: DC | PRN
  Administered 2024-06-29: 25 mg via INTRAVENOUS

## 2024-06-29 MED ORDER — LIDOCAINE 2% (20 MG/ML) 5 ML SYRINGE
INTRAMUSCULAR | Status: DC | PRN
  Administered 2024-06-29: 20 mg via INTRAVENOUS

## 2024-06-29 MED ORDER — FLUCONAZOLE IN SODIUM CHLORIDE 400-0.9 MG/200ML-% IV SOLN
400.0000 mg | Freq: Once | INTRAVENOUS | Status: AC
  Administered 2024-06-29: 400 mg via INTRAVENOUS
  Filled 2024-06-29: qty 200

## 2024-06-29 NOTE — Interval H&P Note (Signed)
 History and Physical Interval Note:  06/29/2024 7:48 AM  Martin Mason  has presented today for surgery, with the diagnosis of iron deficiency anemia.  The various methods of treatment have been discussed with the patient and family. After consideration of risks, benefits and other options for treatment, the patient has consented to  Procedure(s): EGD (ESOPHAGOGASTRODUODENOSCOPY) (N/A) COLONOSCOPY (N/A) as a surgical intervention.  The patient's history has been reviewed, patient examined, no change in status, stable for surgery.  I have reviewed the patient's chart and labs.  Questions were answered to the patient's satisfaction.     Kariyah Baugh C Tationna Fullard

## 2024-06-29 NOTE — Anesthesia Postprocedure Evaluation (Signed)
 Anesthesia Post Note  Patient: Martin Mason  Procedure(s) Performed: EGD (ESOPHAGOGASTRODUODENOSCOPY) COLONOSCOPY     Patient location during evaluation: PACU Anesthesia Type: MAC Level of consciousness: awake Pain management: pain level controlled Vital Signs Assessment: post-procedure vital signs reviewed and stable Respiratory status: spontaneous breathing, nonlabored ventilation and respiratory function stable Cardiovascular status: blood pressure returned to baseline and stable Postop Assessment: no apparent nausea or vomiting Anesthetic complications: no   No notable events documented.  Last Vitals:  Vitals:   06/29/24 1022 06/29/24 1514  BP: 117/72 128/68  Pulse: 64 71  Resp: 18 18  Temp: 36.7 C 36.7 C  SpO2: 100% 96%    Last Pain:  Vitals:   06/29/24 1514  TempSrc: Oral  PainSc:                  Timberly Yott P Aalliyah Kilker

## 2024-06-29 NOTE — Anesthesia Preprocedure Evaluation (Signed)
 Anesthesia Evaluation  Patient identified by MRN, date of birth, ID band Patient awake    Reviewed: Allergy & Precautions, NPO status , Patient's Chart, lab work & pertinent test results  Airway Mallampati: II  TM Distance: >3 FB Neck ROM: Full    Dental  (+) Edentulous Lower, Edentulous Upper   Pulmonary asthma , Current Smoker and Patient abstained from smoking.   Pulmonary exam normal        Cardiovascular hypertension, Pt. on medications + Peripheral Vascular Disease and +CHF  Normal cardiovascular exam  ECHO: 1. Left ventricular ejection fraction, by estimation, is 45 to 50%. The  left ventricle has mildly decreased function. The left ventricle has no  regional wall motion abnormalities. Left ventricular diastolic parameters  are consistent with Grade I  diastolic dysfunction (impaired relaxation).   2. Right ventricular systolic function is normal. The right ventricular  size is normal.   3. The mitral valve is normal in structure. Trivial mitral valve  regurgitation. No evidence of mitral stenosis.   4. The aortic valve is tricuspid. There is mild calcification of the  aortic valve. Aortic valve regurgitation is not visualized. No aortic  stenosis is present.   5. Aortic dilatation noted. There is borderline dilatation of the  ascending aorta, measuring 38 mm.   6. The inferior vena cava is normal in size with greater than 50%  respiratory variability, suggesting right atrial pressure of 3 mmHg.     Neuro/Psych  PSYCHIATRIC DISORDERS      negative neurological ROS     GI/Hepatic negative GI ROS, Neg liver ROS,,,  Endo/Other  negative endocrine ROS    Renal/GU Renal InsufficiencyRenal disease     Musculoskeletal negative musculoskeletal ROS (+)    Abdominal   Peds  Hematology  (+) Blood dyscrasia, anemia   Anesthesia Other Findings iron deficiency anemia  Reproductive/Obstetrics                               Anesthesia Physical Anesthesia Plan  ASA: 3  Anesthesia Plan: MAC   Post-op Pain Management:    Induction:   PONV Risk Score and Plan: 0 and Propofol  infusion and Treatment may vary due to age or medical condition  Airway Management Planned: Nasal Cannula  Additional Equipment:   Intra-op Plan:   Post-operative Plan:   Informed Consent: I have reviewed the patients History and Physical, chart, labs and discussed the procedure including the risks, benefits and alternatives for the proposed anesthesia with the patient or authorized representative who has indicated his/her understanding and acceptance.       Plan Discussed with: CRNA  Anesthesia Plan Comments:         Anesthesia Quick Evaluation

## 2024-06-29 NOTE — Transfer of Care (Signed)
 Immediate Anesthesia Transfer of Care Note  Patient: Martin Mason  Procedure(s) Performed: EGD (ESOPHAGOGASTRODUODENOSCOPY) COLONOSCOPY  Patient Location: PACU  Anesthesia Type:MAC  Level of Consciousness: awake, alert , and oriented  Airway & Oxygen Therapy: Patient Spontanous Breathing and Patient connected to face mask oxygen  Post-op Assessment: Report given to RN and Post -op Vital signs reviewed and stable  Post vital signs: Reviewed and stable  Last Vitals:  Vitals Value Taken Time  BP 92/55 06/29/24 09:34  Temp 36.4 C 06/29/24 09:28  Pulse 63 06/29/24 09:36  Resp 18 06/29/24 09:37  SpO2 98 % 06/29/24 09:36  Vitals shown include unfiled device data.  Last Pain:  Vitals:   06/29/24 0930  TempSrc:   PainSc: Asleep      Patients Stated Pain Goal: 0 (06/27/24 0226)  Complications: No notable events documented.

## 2024-06-29 NOTE — Plan of Care (Signed)
  Problem: Clinical Measurements: Goal: Ability to maintain clinical measurements within normal limits will improve Outcome: Progressing Goal: Will remain free from infection Outcome: Progressing Goal: Diagnostic test results will improve Outcome: Progressing Goal: Respiratory complications will improve Outcome: Progressing Goal: Cardiovascular complication will be avoided Outcome: Progressing   Problem: Activity: Goal: Risk for activity intolerance will decrease Outcome: Progressing   Problem: Nutrition: Goal: Adequate nutrition will be maintained Outcome: Progressing   Problem: Coping: Goal: Level of anxiety will decrease Outcome: Progressing   Problem: Elimination: Goal: Will not experience complications related to bowel motility Outcome: Progressing Goal: Will not experience complications related to urinary retention Outcome: Progressing   Problem: Pain Managment: Goal: General experience of comfort will improve and/or be controlled Outcome: Progressing

## 2024-06-29 NOTE — Progress Notes (Signed)
 PROGRESS NOTE    Martin Mason  FMW:[id number] DOB: 21-Dec-1956 DOA: 06/25/2024 PCP: Delbert Clam, MD   Brief Narrative:   68 y.o. male with medical history significant for hypertension, hyperlipidemia, PAD, chronic HFmrEF, iron deficiency anemia, and alcohol abuse in remission who now presents with shortness of breath, increased leg swelling, and fatigue, found to have anemia without evidence of acute bleeding and received 1 unit of PRBC. Hb stable now.   Assessment & Plan:  Principal Problem:   Symptomatic anemia Active Problems:   Acute on chronic heart failure with mildly reduced ejection fraction (HFmrEF, 41-49%) (HCC)   Essential hypertension, benign   AKI (acute kidney injury) (HCC)   68 y.o. male with medical history significant for hypertension, hyperlipidemia, PAD, chronic HFmrEF, iron deficiency anemia, and alcohol abuse in remission who now presents with shortness of breath, increased leg swelling, and fatigue, found to have anemia without evidence of acute bleeding and received 1 unit of PRBC on 7/2.  Acute blood loss anemia/Possible GI Bleed/symptomatic anemia, POA:  S/p EGD 7/5: gastritis with signs of recent bleeding and gastric/small bowel AVMs that were treated with APC,likely source of his IDA. Gastric biopsies taken to rule out H.pylori. Colonoscopy 7/5: Unremarkable  Hemoglobin of 5 upon arrival.  Hemoglobin was 8.5 in February 2025. Status post 1 unit of PRBC.  Repeat Hb is stable. Follow-up H&H closely. Fecal occult blood testing is negative. Iron studies showed severe iron deficiency anemia Patient doesn't remember having a colonoscopy. Holding aspirin  Continue with protonix  40 mg BID for 10 weeks and then weekly.  Candidal esophagitis: started on oral fluconazole .  Acute kidney injury with non-anion gap metabolic acidosis, POA: Continue to hold lisinopril  and follow-up clinically closely. Held lasix  on 7/4 as no evidence of volume overload and creatinine  was up trending. F/u BMP in am.  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  Echocardiogram done in March 2025 showed EF of 45 to 50%. Fluid restriction, heart healthy diet  Essential hypertension, POA: Blood pressure on the lower side so we will avoid any scheduled antihypertensives.  COPD, POA: Prior CT chest in March 2025 show evidence of emphysema along with pulmonary nodules.  Patient will need outpatient follow-up with pulmonology.  Continue with as needed inhalation bronchodilator therapy.  Lower extremity swelling: Ruled out DVT, improved with diuretics  Vitamin D  deficiency, POA: Continue vitamin D  supplementation  Disposition: Lives at home with his brothers.   DVT prophylaxis: SCDs Start: 06/26/24 0145     Code Status: Full Code Family Communication:   Status is: Inpatient Remains inpatient appropriate because: Acute blood loss anemia    Subjective:  No acute issues overnight Agreeable to EGD and Colonoscopy No evidence of obvious active bleeding at the moment.  Examination:  General exam: Appears calm and comfortable  Respiratory system: Clear to auscultation. Respiratory effort normal. Cardiovascular system: S1 & S2 heard, RRR. No JVD, murmurs, rubs, gallops or clicks. No pedal edema. Gastrointestinal system: Abdomen is nondistended, soft and nontender. No organomegaly or masses felt. Normal bowel sounds heard. Central nervous system: Alert and oriented. No focal neurological deficits. Extremities: Symmetric 5 x 5 power. Skin: No rashes, lesions or ulcers Psychiatry: Judgement and insight appear normal. Mood & affect appropriate.      Diet Orders (From admission, onward)     Start     Ordered   06/29/24 0931  Diet Heart Fluid consistency: Thin  Diet effective now  Question:  Fluid consistency:  Answer:  Thin   06/29/24 0931            Objective: Vitals:   06/29/24 0602 06/29/24 0759 06/29/24  0928 06/29/24 0930  BP: 133/84 (!) 151/86 (!) 90/51 (!) 95/46  Pulse: 77 70 (!) 58 (!) 57  Resp: 18 15 14 16   Temp: (!) 97.3 F (36.3 C) 98.4 F (36.9 C) 97.6 F (36.4 C)   TempSrc:  Temporal    SpO2: 95% 98% 100% 100%  Weight: 47.7 kg 47.7 kg    Height:  5' 8 (1.727 m)      Intake/Output Summary (Last 24 hours) at 06/29/2024 0932 Last data filed at 06/29/2024 9075 Gross per 24 hour  Intake 300 ml  Output 350 ml  Net -50 ml   Filed Weights   06/29/24 0602 06/29/24 0759  Weight: 47.7 kg 47.7 kg    Scheduled Meds:  [MAR Hold] atorvastatin   20 mg Oral Daily   [MAR Hold] cholecalciferol   400 Units Oral Daily   [MAR Hold] ferrous sulfate   325 mg Oral BID WC   [MAR Hold] pantoprazole   40 mg Oral BID AC   [MAR Hold] sodium chloride  flush  3 mL Intravenous Q12H   Continuous Infusions:  Nutritional status     Body mass index is 15.99 kg/m.  Data Reviewed:   CBC: Recent Labs  Lab 06/25/24 2048 06/26/24 0517 06/26/24 1117 06/27/24 0451 06/28/24 0427 06/29/24 0339  WBC 5.5 4.9  --  7.8 9.3 9.2  HGB 5.0* 5.6* 7.4* 7.3* 7.7* 8.0*  HCT 17.6* 19.7* 24.7* 23.6* 25.2* 26.6*  MCV 68.5* 68.9*  --  68.4* 68.7* 69.1*  PLT 293 295  --  337 349 371   Basic Metabolic Panel: Recent Labs  Lab 06/25/24 2048 06/26/24 0517 06/27/24 0451 06/28/24 0427 06/29/24 0339  NA 135 134* 131* 129* 133*  K 3.9 3.9 3.4* 3.9 3.5  CL 109 107 99 99 99  CO2 16* 17* 21* 22 21*  GLUCOSE 92 91 95 101* 107*  BUN 26* 21 18 24* 20  CREATININE 1.94* 1.61* 1.52* 1.81* 1.72*  CALCIUM  8.6* 8.6* 8.9 8.9 9.5  MG  --  2.2  --   --   --    GFR: Estimated Creatinine Clearance: 27.3 mL/min (A) (by C-G formula based on SCr of 1.72 mg/dL (H)). Liver Function Tests: Recent Labs  Lab 06/25/24 2048  AST 35  ALT 24  ALKPHOS 61  BILITOT 0.5  PROT 6.1*  ALBUMIN  3.1*   No results for input(s): LIPASE, AMYLASE in the last 168 hours. No results for input(s): AMMONIA in the last 168  hours. Coagulation Profile: No results for input(s): INR, PROTIME in the last 168 hours. Cardiac Enzymes: No results for input(s): CKTOTAL, CKMB, CKMBINDEX, TROPONINI in the last 168 hours. BNP (last 3 results) Recent Labs    01/17/24 1447  PROBNP 2,597*   HbA1C: No results for input(s): HGBA1C in the last 72 hours. CBG: No results for input(s): GLUCAP in the last 168 hours. Lipid Profile: No results for input(s): CHOL, HDL, LDLCALC, TRIG, CHOLHDL, LDLDIRECT in the last 72 hours. Thyroid Function Tests: No results for input(s): TSH, T4TOTAL, FREET4, T3FREE, THYROIDAB in the last 72 hours. Anemia Panel: No results for input(s): VITAMINB12, FOLATE, FERRITIN, TIBC, IRON, RETICCTPCT in the last 72 hours.  Sepsis Labs: No results for input(s): PROCALCITON, LATICACIDVEN in the last 168 hours.  No results found for this or any previous visit (from the  past 240 hours).       Radiology Studies: No results found.     LOS: 4 days   Time spent= 41 mins   Deliliah Room, MD [facility]  If 7PM-7AM, please contact night-coverage  06/29/2024, 9:32 AM

## 2024-06-29 NOTE — Progress Notes (Signed)
 Patient of unit for EDG

## 2024-06-29 NOTE — Plan of Care (Signed)
  Problem: Education: Goal: Knowledge of General Education information will improve Description: Including pain rating scale, medication(s)/side effects and non-pharmacologic comfort measures Outcome: Progressing   Problem: Health Behavior/Discharge Planning: Goal: Ability to manage health-related needs will improve Outcome: Progressing   Problem: Clinical Measurements: Goal: Ability to maintain clinical measurements within normal limits will improve Outcome: Progressing Goal: Will remain free from infection Outcome: Progressing Goal: Diagnostic test results will improve Outcome: Progressing Goal: Respiratory complications will improve Outcome: Progressing Goal: Cardiovascular complication will be avoided Outcome: Progressing   Problem: Activity: Goal: Risk for activity intolerance will decrease Outcome: Progressing   Problem: Nutrition: Goal: Adequate nutrition will be maintained Outcome: Progressing   Problem: Coping: Goal: Level of anxiety will decrease Outcome: Progressing   Problem: Elimination: Goal: Will not experience complications related to bowel motility Outcome: Progressing Goal: Will not experience complications related to urinary retention Outcome: Progressing   Problem: Pain Managment: Goal: General experience of comfort will improve and/or be controlled Outcome: Progressing   Problem: Safety: Goal: Ability to remain free from injury will improve Outcome: Progressing   Problem: Skin Integrity: Goal: Risk for impaired skin integrity will decrease Outcome: Progressing   Problem: Education: Goal: Ability to demonstrate management of disease process will improve Outcome: Progressing Goal: Ability to verbalize understanding of medication therapies will improve Outcome: Progressing Goal: Individualized Educational Video(s) Outcome: Progressing   Problem: Activity: Goal: Capacity to carry out activities will improve Outcome: Progressing    Problem: Cardiac: Goal: Ability to achieve and maintain adequate cardiopulmonary perfusion will improve Outcome: Progressing   Problem: Activity: Goal: Capacity to carry out activities will improve Outcome: Progressing

## 2024-06-29 NOTE — Op Note (Signed)
 [facility] Patient Name: Martin Mason Procedure Date : 06/29/2024 MRN: [id number] Attending MD: Rosario Estefana Kidney , , [id number] Date of Birth: 04-18-1956 CSN: [id number] Age: 68 Admit Type: Inpatient Procedure:                Colonoscopy Indications:              Iron deficiency anemia Providers:                Rosario Estefana Kidney, Darleene Bare, RN, Haskel Chris, Technician Referring MD:             Hospitalist team Medicines:                Monitored Anesthesia Care Complications:            No immediate complications. Estimated Blood Loss:     Estimated blood loss: none. Procedure:                Pre-Anesthesia Assessment:                           - Prior to the procedure, a History and Physical                            was performed, and patient medications and                            allergies were reviewed. The patient's tolerance of                            previous anesthesia was also reviewed. The risks                            and benefits of the procedure and the sedation                            options and risks were discussed with the patient.                            All questions were answered, and informed consent                            was obtained. Prior Anticoagulants: The patient has                            taken no anticoagulant or antiplatelet agents. ASA                            Grade Assessment: III - A patient with severe                            systemic disease. After reviewing the risks and  benefits, the patient was deemed in satisfactory                            condition to undergo the procedure.                           After obtaining informed consent, the colonoscope                            was passed under direct vision. Throughout the                            procedure, the patient's blood pressure, pulse, and                            oxygen  saturations were monitored continuously. The                            [id number]-HQ190L ([id number]) Olympus coloscope was                            introduced through the anus and advanced to the the                            terminal ileum. The colonoscopy was performed                            without difficulty. The patient tolerated the                            procedure well. The quality of the bowel                            preparation was excellent. The terminal ileum,                            ileocecal valve, appendiceal orifice, and rectum                            were photographed. Scope In: 9:00:37 AM Scope Out: 9:19:15 AM Scope Withdrawal Time: 0 hours 12 minutes 6 seconds  Total Procedure Duration: 0 hours 18 minutes 38 seconds  Findings:      The terminal ileum appeared normal.      Non-bleeding internal hemorrhoids were found during retroflexion. Impression:               - The examined portion of the ileum was normal.                           - Non-bleeding internal hemorrhoids.                           - No specimens collected. Recommendation:           - Return patient to hospital ward for ongoing care.                           -  Repeat colonoscopy in 10 years for screening                            purposes.                           - The findings and recommendations were discussed                            with the patient. Procedure Code(s):        --- Professional ---                           [phone], Colonoscopy, flexible; diagnostic, including                            collection of specimen(s) by brushing or washing,                            when performed (separate procedure) Diagnosis Code(s):        --- Professional ---                           K64.8, Other hemorrhoids                           D50.9, Iron deficiency anemia, unspecified CPT copyright 2022 [employer]. All rights reserved. The codes documented in this report are  preliminary and upon coder review may  be revised to meet current compliance requirements. Dr Estefana Federico Rosario Estefana Federico,  06/29/2024 9:27:51 AM Number of Addenda: 0

## 2024-06-29 NOTE — Op Note (Signed)
 [facility] Patient Name: Martin Mason Procedure Date : 06/29/2024 MRN: [id number] Attending MD: Rosario Estefana Kidney , , [id number] Date of Birth: Apr 19, 1956 CSN: [id number] Age: 68 Admit Type: Inpatient Procedure:                Upper GI endoscopy Indications:              Iron deficiency anemia Providers:                Rosario Estefana Kidney, Darleene Bare, RN, Haskel Chris, Technician Referring MD:             Hospitalist team Medicines:                Monitored Anesthesia Care Complications:            No immediate complications. Estimated Blood Loss:     Estimated blood loss was minimal. Procedure:                Pre-Anesthesia Assessment:                           - Prior to the procedure, a History and Physical                            was performed, and patient medications and                            allergies were reviewed. The patient's tolerance of                            previous anesthesia was also reviewed. The risks                            and benefits of the procedure and the sedation                            options and risks were discussed with the patient.                            All questions were answered, and informed consent                            was obtained. Prior Anticoagulants: The patient has                            taken no anticoagulant or antiplatelet agents. ASA                            Grade Assessment: III - A patient with severe                            systemic disease. After reviewing the risks and  benefits, the patient was deemed in satisfactory                            condition to undergo the procedure.                           After obtaining informed consent, the endoscope was                            passed under direct vision. Throughout the                            procedure, the patient's blood pressure, pulse, and                             oxygen saturations were monitored continuously. The                            [id number]-[id number] ([id number]) Olympus endoscope was introduced                            through the mouth, and advanced to the second part                            of duodenum. The upper GI endoscopy was                            accomplished without difficulty. The patient                            tolerated the procedure well. Scope In: Scope Out: Findings:      Patchy, white plaques were found in the proximal esophagus.      Eight angioectasias with no bleeding were found in the gastric body.       Coagulation for bleeding prevention using argon plasma at 0.8       liters/minute and 20 watts was successful.      Localized inflammation with hemorrhage characterized by congestion       (edema), erosions, erythema and friability was found in the gastric       antrum. Biopsies were taken with a cold forceps for histology.      A single angioectasia without bleeding was found in the duodenal bulb.       Coagulation for bleeding prevention using argon plasma at 2       liters/minute and 20 watts was successful. Impression:               - Esophageal plaques were found, consistent with                            candidiasis.                           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                            Treated with  argon plasma coagulation (APC).                           - Gastritis with hemorrhage. Biopsied.                           - A single non-bleeding angioectasia in the                            duodenum. Treated with argon plasma coagulation                            (APC). Recommendation:           - It is suspected that the patient's iron                            deficiency anemia is due to gastritis and/or                            gastric/small bowel AVMs.                           - Await pathology results.                           - PPI BID for 10 weeks, then QD                            - Fluconazole  for presumed candida esophagitis.                            Ordered pharmacy consult to help with dosing.                           - We will arrange for outpatient GI follow up.                           - Perform a colonoscopy today. Procedure Code(s):        --- Professional ---                           43255, 59, Esophagogastroduodenoscopy, flexible,                            transoral; with control of bleeding, any method                           43239, Esophagogastroduodenoscopy, flexible,                            transoral; with biopsy, single or multiple Diagnosis Code(s):        --- Professional ---                           K22.9, Disease of esophagus, unspecified  K31.819, Angiodysplasia of stomach and duodenum                            without bleeding                           K29.71, Gastritis, unspecified, with bleeding                           D50.9, Iron deficiency anemia, unspecified CPT copyright 2022 American [employer] Association. All rights reserved. The codes documented in this report are preliminary and upon coder review may  be revised to meet current compliance requirements. Dr Estefana Federico Rosario Estefana Federico,  06/29/2024 9:25:56 AM Number of Addenda: 0

## 2024-06-30 ENCOUNTER — Encounter (HOSPITAL_COMMUNITY): Payer: Self-pay | Admitting: Internal Medicine

## 2024-06-30 LAB — CBC
HCT: 26.2 % — ABNORMAL LOW (ref 39.0–52.0)
Hemoglobin: 8.1 g/dL — ABNORMAL LOW (ref 13.0–17.0)
MCH: 22.1 pg — ABNORMAL LOW (ref 26.0–34.0)
MCHC: 30.9 g/dL (ref 30.0–36.0)
MCV: 71.6 fL — ABNORMAL LOW (ref 80.0–100.0)
Platelets: 318 K/uL (ref 150–400)
RBC: 3.66 MIL/uL — ABNORMAL LOW (ref 4.22–5.81)
RDW: 24.1 % — ABNORMAL HIGH (ref 11.5–15.5)
WBC: 8.3 K/uL (ref 4.0–10.5)
nRBC: 0.2 % (ref 0.0–0.2)

## 2024-06-30 MED ORDER — PANTOPRAZOLE SODIUM 40 MG PO TBEC: 40.0000 mg | DELAYED_RELEASE_TABLET | Freq: Two times a day (BID) | ORAL | 0 refills | Status: DC

## 2024-06-30 MED ORDER — CHOLECALCIFEROL 10 MCG (400 UNIT) PO TABS: 400.0000 [IU] | ORAL_TABLET | Freq: Every day | ORAL | 0 refills | Status: AC

## 2024-06-30 MED ORDER — FLUCONAZOLE 100 MG PO TABS
100.0000 mg | ORAL_TABLET | Freq: Every day | ORAL | 0 refills | Status: AC
Start: 2024-06-30 — End: 2024-07-30

## 2024-06-30 NOTE — Discharge Summary (Signed)
 Physician Discharge Summary   Patient: Martin Mason MRN: [id number] DOB: Dec 08, 1956  Admit date:     06/25/2024  Discharge date: 06/30/24  Discharge Physician: Deliliah Room   PCP: Delbert Clam, MD   Recommendations at discharge:    Follow up with your PCP in one week. 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 male with medical history significant for hypertension, hyperlipidemia, PAD, chronic HFmrEF, iron deficiency anemia, and alcohol abuse in remission who now presents with shortness of breath, increased leg swelling, and fatigue, found to have anemia without evidence of acute bleeding and received 1 unit of PRBC on 7/2.   Acute blood loss anemia/Possible GI Bleed/symptomatic anemia, POA:   S/p EGD 7/5: gastritis with signs of recent bleeding and gastric/small bowel AVMs that were treated with APC,likely source of his IDA. Gastric biopsies taken to rule out H.pylori.  Colonoscopy 7/5: Unremarkable   Hemoglobin of 5 upon arrival.  Hemoglobin was 8.5 in February 2025. Status post 1 unit of PRBC.  Repeat Hb is stable. Fecal occult blood testing is negative. Iron studies showed severe iron deficiency anemia Holding aspirin  Continue with protonix  40 mg BID for 10 weeks and then once daily. F/u H.pylori testing results GI follow up on discharge.   Candidal esophagitis: started on oral fluconazole  100 mg x 21 days and then assess the need for course extension.   Acute kidney injury with non-anion gap metabolic acidosis, POA: Continue to hold lisinopril  and follow-up clinically  closely. Held lasix  on 7/4 as no evidence of volume overload and creatinine was up trending. Resumed lasix  on discharge.   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  Echocardiogram done in March 2025 showed EF of 45 to 50%. Fluid restriction, heart healthy diet   Essential hypertension, POA: Continue with home meds on discharge. Advised to check BP daily at home.   COPD, POA: Prior CT chest in March 2025 show evidence of emphysema along with pulmonary nodules.  Patient will need outpatient follow-up with pulmonology.  Continue with as needed inhalation bronchodilator therapy.   Lower extremity swelling: Ruled out DVT, improved with diuretics   Vitamin D  deficiency, POA: Continue vitamin D  supplementation   Disposition: Lives at home with his brothers.        Consultants: GI Procedures performed: EGD, Colonoscopy  Disposition: Home Diet recommendation:  Cardiac diet DISCHARGE MEDICATION: Allergies as of 06/30/2024   No Known Allergies      Medication List     STOP taking these medications    aspirin  EC 81 MG tablet   cilostazol  100 MG tablet Commonly known as: PLETAL    omeprazole  40 MG capsule Commonly known as: PRILOSEC   polyethylene glycol 17 g packet Commonly known as: MiraLax        TAKE these medications    amLODipine  10 MG tablet Commonly known as: NORVASC  Take 1 tablet (10 mg total) by mouth daily.   atorvastatin  20 MG tablet Commonly known as: LIPITOR Take 1 tablet (20 mg total) by mouth daily.   cetirizine  10 MG  tablet Commonly known as: ZYRTEC  Take 1 tablet (10 mg total) by mouth daily.   cholecalciferol  10 MCG (400 UNIT) Tabs tablet Commonly known as: VITAMIN D3 Take 1 tablet (400 Units total) by mouth daily. Start taking on: July 01, 2024 What changed:  medication strength how much to take   ferrous sulfate  325 (65 FE) MG tablet Take 1 tablet (325 mg total) by mouth daily.   fluconazole   100 MG tablet Commonly known as: Diflucan  Take 1 tablet (100 mg total) by mouth daily.   fluticasone  50 MCG/ACT nasal spray Commonly known as: FLONASE  Use 2 spray(s) in each nostril once daily What changed:  how much to take how to take this when to take this reasons to take this additional instructions   furosemide  20 MG tablet Commonly known as: LASIX  Take 1 tablet by mouth once daily   lisinopril -hydrochlorothiazide  20-25 MG tablet Commonly known as: ZESTORETIC  Take 1 tablet by mouth daily.   pantoprazole  40 MG tablet Commonly known as: PROTONIX  Take 1 tablet (40 mg total) by mouth 2 (two) times daily before a meal. Protonix  BID x 10 weeks and then once daily   potassium chloride  SA 20 MEQ tablet Commonly known as: KLOR-CON  M Take 1 tablet by mouth once daily   sildenafil  50 MG tablet Commonly known as: VIAGRA  TAKE 1 TABLET BY MOUTH ONCE DAILY AS NEEDED FOR ERECTILE DYSFUNCTION AT  LEAST  24  HOURS  BETWEEN  DOSES What changed: See the new instructions.        Follow-up Information     [facility] Follow up.   Specialty: Home Health Services Why: Someone will call you to schedule first home visit. Contact information: [address] [place] [address] [phone]         Newlin, Enobong, MD Follow up in 1 week(s).   Specialty: Family Medicine Contact information: [address] [place] [address] [place] [address] [phone]         [facility]. Call.                 Discharge Exam: Filed Weights   06/29/24 0602 06/29/24 0759 06/30/24 0442  Weight: 47.7 kg 47.7 kg 48.9 kg   Constitutional: NAD, calm, comfortable Eyes: PERRL, lids and conjunctivae normal ENMT: Mucous membranes are moist. Posterior pharynx clear of any exudate or lesions.Normal dentition.  Neck: normal, supple, no masses, no thyromegaly Respiratory: clear to auscultation bilaterally, no wheezing, no crackles. Normal respiratory effort.  No accessory muscle use.  Cardiovascular: Regular rate and rhythm, no murmurs / rubs / gallops. No extremity edema. 2+ pedal pulses. No carotid bruits.  Abdomen: no tenderness, no masses palpated. No hepatosplenomegaly. Bowel sounds positive.  Musculoskeletal: no clubbing / cyanosis. No joint deformity upper and lower extremities. Good ROM, no contractures. Normal muscle tone.  Skin: no rashes, lesions, ulcers. No induration Neurologic: CN 2-12 grossly intact. Sensation intact, DTR normal. Strength 5/5 x all 4 extremities.  Psychiatric: Normal judgment and insight. Alert and oriented x 3. Normal mood.    Condition at discharge: good  The results of significant diagnostics from this hospitalization (including imaging, microbiology, ancillary and laboratory) are listed below for reference.   Imaging Studies: VAS US  LOWER EXTREMITY VENOUS (DVT) Result Date: 06/26/2024  Lower Venous DVT Study Patient Name:  SHULEM MADER  Date of Exam:   06/26/2024 Medical Rec #: [id number]    Accession #:    [id number] Date of Birth: Oct 14, 1956   Patient Gender: M Patient  Age:   21 years Exam Location:  [facility] Procedure:      VAS US  LOWER EXTREMITY VENOUS (DVT) Referring Phys: TIMOTHY OPYD --------------------------------------------------------------------------------  Indications: Swelling.  Limitations: Patient positioning (lying on right side). Comparison Study: Previous exam on 07/06/2023 was negative for DVT Performing Technologist: Ezzie Potters RVT, RDMS  Examination Guidelines: A complete evaluation includes B-mode imaging, spectral Doppler, color Doppler, and power Doppler as needed of all accessible portions of each vessel. Bilateral testing is considered an integral part of a complete examination. Limited examinations for reoccurring indications may be performed as noted. The reflux portion of the exam is performed with the patient in reverse Trendelenburg.   +---------+---------------+---------+-----------+----------+--------------+ RIGHT    CompressibilityPhasicitySpontaneityPropertiesThrombus Aging +---------+---------------+---------+-----------+----------+--------------+ CFV      Full           Yes      Yes                                 +---------+---------------+---------+-----------+----------+--------------+ SFJ      Full                                                        +---------+---------------+---------+-----------+----------+--------------+ FV Prox  Full           Yes      Yes                                 +---------+---------------+---------+-----------+----------+--------------+ FV Mid   Full           Yes      Yes                                 +---------+---------------+---------+-----------+----------+--------------+ FV DistalFull           Yes      Yes                                 +---------+---------------+---------+-----------+----------+--------------+ PFV      Full                                                        +---------+---------------+---------+-----------+----------+--------------+ POP      Full           Yes      Yes                                 +---------+---------------+---------+-----------+----------+--------------+ PTV      Full                                                        +---------+---------------+---------+-----------+----------+--------------+ PERO     Full                                                        +---------+---------------+---------+-----------+----------+--------------+   +----+---------------+---------+-----------+----------+--------------+  LEFTCompressibilityPhasicitySpontaneityPropertiesThrombus Aging +----+---------------+---------+-----------+----------+--------------+ CFV Full           Yes      Yes                                  +----+---------------+---------+-----------+----------+--------------+     Summary: RIGHT: - There is no evidence of deep vein thrombosis in the lower extremity.  - No cystic structure found in the popliteal fossa. Subcutaneous edema of calf and ankle.  LEFT: - No evidence of common femoral vein obstruction.  - Ultrasound characteristics of enlarged lymph nodes noted in the groin.  *See table(s) above for measurements and observations. Electronically signed by Gaile New MD on 06/26/2024 at 1:43:58 PM.    Final    DG Chest Port 1 View Result Date: 06/25/2024 CLINICAL DATA:  Shortness of breath. EXAM: PORTABLE CHEST 1 VIEW COMPARISON:  Chest CT dated 02/03/2024. FINDINGS: No focal consolidation, pleural effusion or pneumothorax. Mild cardiomegaly. No acute osseous pathology. IMPRESSION: 1. No active disease. 2. Mild cardiomegaly. Electronically Signed   By: Vanetta Chou M.D.   On: 06/25/2024 21:44    Microbiology: Results for orders placed or performed during the hospital encounter of 02/03/24  Resp panel by RT-PCR (RSV, Flu A&B, Covid) Anterior Nasal Swab     Status: None   Collection Time: 02/03/24 11:32 AM   Specimen: Anterior Nasal Swab  Result Value Ref Range Status   SARS Coronavirus 2 by RT PCR NEGATIVE NEGATIVE Final    Comment: (NOTE) SARS-CoV-2 target nucleic acids are NOT DETECTED.  The SARS-CoV-2 RNA is generally detectable in upper respiratory specimens during the acute phase of infection. The lowest concentration of SARS-CoV-2 viral copies this assay can detect is 138 copies/mL. A negative result does not preclude SARS-Cov-2 infection and should not be used as the sole basis for treatment or other patient management decisions. A negative result may occur with  improper specimen collection/handling, submission of specimen other than nasopharyngeal swab, presence of viral mutation(s) within the areas targeted by this assay, and inadequate number of viral copies(<138  copies/mL). A negative result must be combined with clinical observations, patient history, and epidemiological information. The expected result is Negative.  Fact Sheet for Patients:  BloggerCourse.com  Fact Sheet for Healthcare Providers:  SeriousBroker.it  This test is no t yet approved or cleared by the [place]  FDA and  has been authorized for detection and/or diagnosis of SARS-CoV-2 by FDA under an Emergency Use Authorization (EUA). This EUA will remain  in effect (meaning this test can be used) for the duration of the COVID-19 declaration under Section 564(b)(1) of the Act, 21 U.S.C.section 360bbb-3(b)(1), unless the authorization is terminated  or revoked sooner.       Influenza A by PCR NEGATIVE NEGATIVE Final   Influenza B by PCR NEGATIVE NEGATIVE Final    Comment: (NOTE) The Xpert Xpress SARS-CoV-2/FLU/RSV plus assay is intended as an aid in the diagnosis of influenza from Nasopharyngeal swab specimens and should not be used as a sole basis for treatment. Nasal washings and aspirates are unacceptable for Xpert Xpress SARS-CoV-2/FLU/RSV testing.  Fact Sheet for Patients: BloggerCourse.com  Fact Sheet for Healthcare Providers: SeriousBroker.it  This test is not yet approved or cleared by the [place]  FDA and has been authorized for detection and/or diagnosis of SARS-CoV-2 by FDA under an Emergency Use Authorization (EUA). This EUA will remain in effect (meaning this test can be used) for  the duration of the COVID-19 declaration under Section 564(b)(1) of the Act, 21 U.S.C. section 360bbb-3(b)(1), unless the authorization is terminated or revoked.     Resp Syncytial Virus by PCR NEGATIVE NEGATIVE Final    Comment: (NOTE) Fact Sheet for Patients: BloggerCourse.com  Fact Sheet for Healthcare  Providers: SeriousBroker.it  This test is not yet approved or cleared by the [place]  FDA and has been authorized for detection and/or diagnosis of SARS-CoV-2 by FDA under an Emergency Use Authorization (EUA). This EUA will remain in effect (meaning this test can be used) for the duration of the COVID-19 declaration under Section 564(b)(1) of the Act, 21 U.S.C. section 360bbb-3(b)(1), unless the authorization is terminated or revoked.  Performed at [facility], [address] [place], [place] [address]     Labs: CBC: Recent Labs  Lab 06/26/24 [id number] 06/26/24 1117 06/27/24 0451 06/28/24 0427 06/29/24 0339 06/30/24 0418  WBC 4.9  --  7.8 9.3 9.2 8.3  HGB 5.6* 7.4* 7.3* 7.7* 8.0* 8.1*  HCT 19.7* 24.7* 23.6* 25.2* 26.6* 26.2*  MCV 68.9*  --  68.4* 68.7* 69.1* 71.6*  PLT 295  --  337 349 371 318   Basic Metabolic Panel: Recent Labs  Lab 06/25/24 2048 06/26/24 0517 06/27/24 0451 06/28/24 0427 06/29/24 0339  NA 135 134* 131* 129* 133*  K 3.9 3.9 3.4* 3.9 3.5  CL 109 107 99 99 99  CO2 16* 17* 21* 22 21*  GLUCOSE 92 91 95 101* 107*  BUN 26* 21 18 24* 20  CREATININE 1.94* 1.61* 1.52* 1.81* 1.72*  CALCIUM  8.6* 8.6* 8.9 8.9 9.5  MG  --  2.2  --   --   --    Liver Function Tests: Recent Labs  Lab 06/25/24 2048  AST 35  ALT 24  ALKPHOS 61  BILITOT 0.5  PROT 6.1*  ALBUMIN  3.1*   CBG: No results for input(s): GLUCAP in the last 168 hours.  Discharge time spent: 43 minutes  Signed: Deliliah Room, MD [facility] 06/30/2024

## 2024-06-30 NOTE — Discharge Instructions (Addendum)
 Follow up with your PCP in one week. Avoid taking aspirin , advil, motrin, ibuprofen etc Take protonix  40 mg twice a day for 10 weeks and then once a day Follow up out patient with GI in a month.

## 2024-06-30 NOTE — Plan of Care (Signed)
  Problem: Education: Goal: Knowledge of General Education information will improve Description: Including pain rating scale, medication(s)/side effects and non-pharmacologic comfort measures Outcome: Completed/Met   Problem: Health Behavior/Discharge Planning: Goal: Ability to manage health-related needs will improve Outcome: Completed/Met   Problem: Clinical Measurements: Goal: Ability to maintain clinical measurements within normal limits will improve Outcome: Completed/Met Goal: Will remain free from infection Outcome: Completed/Met Goal: Diagnostic test results will improve Outcome: Completed/Met Goal: Respiratory complications will improve Outcome: Completed/Met Goal: Cardiovascular complication will be avoided Outcome: Completed/Met   Problem: Activity: Goal: Risk for activity intolerance will decrease Outcome: Completed/Met   Problem: Nutrition: Goal: Adequate nutrition will be maintained Outcome: Completed/Met   Problem: Coping: Goal: Level of anxiety will decrease Outcome: Completed/Met   Problem: Elimination: Goal: Will not experience complications related to bowel motility Outcome: Completed/Met Goal: Will not experience complications related to urinary retention Outcome: Completed/Met   Problem: Pain Managment: Goal: General experience of comfort will improve and/or be controlled Outcome: Completed/Met   Problem: Safety: Goal: Ability to remain free from injury will improve Outcome: Completed/Met   Problem: Skin Integrity: Goal: Risk for impaired skin integrity will decrease Outcome: Completed/Met   Problem: Education: Goal: Ability to demonstrate management of disease process will improve Outcome: Completed/Met Goal: Ability to verbalize understanding of medication therapies will improve Outcome: Completed/Met Goal: Individualized Educational Video(s) Outcome: Completed/Met   Problem: Activity: Goal: Capacity to carry out activities will  improve Outcome: Completed/Met   Problem: Cardiac: Goal: Ability to achieve and maintain adequate cardiopulmonary perfusion will improve Outcome: Completed/Met   Problem: Activity: Goal: Capacity to carry out activities will improve Outcome: Completed/Met

## 2024-06-30 NOTE — TOC Transition Note (Signed)
 Transition of Care [facility]) - Discharge Note   Patient Details  Name: Martin Mason MRN: [id number] Date of Birth: Jan 08, 1956  Transition of Care [facility]) CM/SW Contact:  Robynn Eileen Hoose, RN Phone Number: 06/30/2024, 10:12 AM   Clinical Narrative:  Patient is being discharged today. Dorothe with [facility] made aware.    Final next level of care: Home w Home Health Services Barriers to Discharge: No Barriers Identified   Patient Goals and CMS Choice Patient states their goals for this hospitalization and ongoing recovery are:: To return home CMS Medicare.gov Compare Post Acute Care list provided to:: Patient Choice offered to / list presented to : Patient      Discharge Placement                       Discharge Plan and Services Additional resources added to the After Visit Summary for     Discharge Planning Services: CM Consult Post Acute Care Choice: Home Health          DME Arranged: N/A DME Agency: NA       HH Arranged: PT, RN, Nurse's Aide, Disease Management HH Agency: [facility] Date [facility] Agency Contacted: 06/27/24 Time HH Agency Contacted: 1300 Representative spoke with at [facility] Agency: Amy  Social Drivers of Health (SDOH) Interventions SDOH Screenings   Food Insecurity: No Food Insecurity (06/26/2024)  Housing: Low Risk  (06/26/2024)  Transportation Needs: Patient Declined (06/26/2024)  Utilities: Patient Declined (06/26/2024)  Alcohol Screen: Low Risk  (02/06/2023)  Depression (PHQ2-9): Low Risk  (01/17/2024)  Financial Resource Strain: Low Risk  (02/06/2023)  Physical Activity: Sufficiently Active (02/06/2023)  Social Connections: Patient Declined (06/26/2024)  Stress: No Stress Concern Present (02/06/2023)  Tobacco Use: High Risk (06/26/2024)     Readmission Risk Interventions    06/27/2024    1:22 PM  Readmission Risk Prevention Plan  Transportation Screening Complete  PCP or Specialist Appt within 5-7 Days Complete  Home Care Screening Complete   Medication Review (RN CM) Referral to Pharmacy

## 2024-06-30 NOTE — Progress Notes (Signed)
 Patient stable for discharge. Discharge summary reviewed with patient. Patient verbalized understanding. PIV removed. Patient belongings returned. Patient expressed satisfaction with care provided by hospital staff. Patient will be picked up and transported home by brother in law.

## 2024-07-01 ENCOUNTER — Telehealth: Payer: Self-pay

## 2024-07-01 ENCOUNTER — Other Ambulatory Visit: Payer: Self-pay | Admitting: Family Medicine

## 2024-07-01 ENCOUNTER — Other Ambulatory Visit: Payer: Self-pay | Admitting: Pharmacist

## 2024-07-01 DIAGNOSIS — R430 Anosmia: Secondary | ICD-10-CM

## 2024-07-01 MED ORDER — FUROSEMIDE 20 MG PO TABS: 20.0000 mg | ORAL_TABLET | Freq: Every day | ORAL | 1 refills | Status: DC

## 2024-07-01 NOTE — Transitions of Care (Post Inpatient/ED Visit) (Signed)
   07/01/2024  Name: Martin Mason MRN: [id number] DOB: [birth year]  Today's TOC FU Call Status: Today's TOC FU Call Status:: Successful TOC FU Call Completed TOC FU Call Complete Date: 07/01/24 Patient's Name and Date of Birth confirmed.  Transition Care Management Follow-up Telephone Call Date of Discharge: 06/30/24 Discharge Facility: [facility]) Type of Discharge: Inpatient Admission Primary Inpatient Discharge Diagnosis:: symptomatic anemia How have you been since you were released from the hospital?: Better (He stated he is feeling alot better) Any questions or concerns?: Yes Patient Questions/Concerns:: He is concerned about getting refills for his medications. Patient Questions/Concerns Addressed: Notified Provider of Patient Questions/Concerns (Message sent to PCP and Herlene Fleeta Morris, [facility])  Items Reviewed: Did you receive and understand the discharge instructions provided?: Yes (He said he has difficulty reading but his brother can help) Medications obtained,verified, and reconciled?: Partial Review Completed Reason for Partial Mediation Review: He had his brother, Beryl speak to me about the meds. Beryl said he needs refills for flonase , potassium and furosemide . Beryl and the patient did not want to review the entire list, they said they didnt' have any questions about the med regime. Any new allergies since your discharge?: No Dietary orders reviewed?: Yes Type of Diet Ordered:: heart healthy, low sodium Do you have support at home?: Yes People in Home [RPT]: sibling(s) Name of Support/Comfort Primary Source: He lives with his brothers.  Medications Reviewed Today: Medications Reviewed Today   Medications were not reviewed in this encounter     Home Care and Equipment/Supplies: Were Home Health Services Ordered?: Yes Name of Home Health Agency[facility] Has Agency set up a time to come to your home?: No EMR reviewed for Home Health Orders: Orders present/patient  has not received call (refer to CM for follow-up) (I called [facility] and spoke to [place] who could not find the referral. She said she will need to check with their sales reps- Amy and Dorothe to determine the status of this referral) Any new equipment or medical supplies ordered?: No  Functional Questionnaire: Do you need assistance with bathing/showering or dressing?: No Do you need assistance with meal preparation?: No Do you need assistance with eating?: No Do you have difficulty maintaining continence: No Do you need assistance with getting out of bed/getting out of a chair/moving?: No Do you have difficulty managing or taking your medications?: No  Follow up appointments reviewed: PCP Follow-up appointment confirmed?: Yes Date of PCP follow-up appointment?: 07/17/24 Follow-up Provider: [facility] Follow-up appointment confirmed?: No Reason Specialist Follow-Up Not Confirmed: Appointment Sceduled by [facility] Calling Clinician (The patient said he did not want to call GI for an appointment yet.  He also has instructions on AVS to call pulmonary in a month.) Do you need transportation to your follow-up appointment?: No Do you understand care options if your condition(s) worsen?: Yes-patient verbalized understanding    SIGNATURE  Slater Diesel, RN

## 2024-07-02 ENCOUNTER — Ambulatory Visit: Payer: Self-pay | Admitting: Internal Medicine

## 2024-07-02 ENCOUNTER — Telehealth: Payer: Self-pay | Admitting: *Deleted

## 2024-07-02 LAB — SURGICAL PATHOLOGY

## 2024-07-02 NOTE — Transitions of Care (Post Inpatient/ED Visit) (Signed)
 07/02/2024  Name: Martin Mason MRN: [id number] DOB: [birth year]  Today's TOC FU Call Status: Today's TOC FU Call Status:: Successful TOC FU Call Completed TOC FU Call Complete Date: 07/02/24 Patient's Name and Date of Birth confirmed.  Transition Care Management Follow-up Telephone Call Date of Discharge: 06/30/24 Discharge Facility: [facility]) Type of Discharge: Inpatient Admission Primary Inpatient Discharge Diagnosis:: Symptomatic anemia How have you been since you were released from the hospital?: Better Any questions or concerns?: No  Items Reviewed: Did you receive and understand the discharge instructions provided?: Yes Medications obtained,verified, and reconciled?: No Medications Not Reviewed Reasons:: Other: Reason for Partial Mediation Review: Patient declined medication review today. Reports he will pick up newmedications from [facility] today. RNCM advised patient to take all medications to PCP appointment on 07/17/24. RNCM reviewed medications to stop taking as indicated on discharge instructions with patient. Patient voiced understanding. People in Home [RPT]: sibling(s) Name of Support/Comfort Primary Source: Patient lives with his two brothers  Medications Reviewed Today: Medications Reviewed Today     Reviewed by Lucky Andrea LABOR, RN (Registered Nurse) on 07/02/24 at 1149  Med List Status: <None>   Medication Order Taking? Sig Documenting Provider Last Dose Status Informant  amLODipine  (NORVASC ) 10 MG tablet [id number] No Take 1 tablet (10 mg total) by mouth daily. Newlin, Enobong, MD Past Week Active Family Member, Self, Care Giver, Pharmacy Records           Med Note STEFFI, ADELITA   Wed Jun 26, 2024  9:50 AM)    atorvastatin  (LIPITOR) 20 MG tablet [id number] No Take 1 tablet (20 mg total) by mouth daily. Newlin, Enobong, MD Past Week Active Family Member, Self, Care Giver, Pharmacy Records           Med Note STEFFI, ADELITA   Wed Jun 26, 2024  9:50 AM)     cetirizine  (ZYRTEC ) 10 MG tablet [id number] No Take 1 tablet (10 mg total) by mouth daily. Newlin, Enobong, MD Past Week Active Self, Family Member, Care Giver, Pharmacy Records  cholecalciferol  (VITAMIN D3) 10 MCG (400 UNIT) TABS tablet [id number]  Take 1 tablet (400 Units total) by mouth daily. Dino Antu, MD  Active   ferrous sulfate  325 (65 FE) MG tablet [id number] No Take 1 tablet (325 mg total) by mouth daily.  Patient not taking: Reported on 06/26/2024   Yolande Lamar BROCKS, MD Not Taking Active Family Member, Self, Care Giver, Pharmacy Records  fluconazole  (DIFLUCAN ) 100 MG tablet [id number]  Take 1 tablet (100 mg total) by mouth daily. Rashid, Farhan, MD  Active   fluticasone  (FLONASE ) 50 MCG/ACT nasal spray [id number]  Use 2 spray(s) in each nostril once daily Newlin, Enobong, MD  Active   furosemide  (LASIX ) 20 MG tablet [id number]  Take 1 tablet (20 mg total) by mouth daily. Newlin, Enobong, MD  Active   lisinopril -hydrochlorothiazide  (ZESTORETIC ) 20-25 MG tablet [id number] No Take 1 tablet by mouth daily. [provider] Past Week Active Self, Family Member, Care Giver, Pharmacy Records  pantoprazole  (PROTONIX ) 40 MG tablet [id number]  Take 1 tablet (40 mg total) by mouth 2 (two) times daily before a meal. Protonix  BID x 10 weeks and then once daily Rashid, Farhan, MD  Active   potassium chloride  SA (KLOR-CON  M) 20 MEQ tablet [id number]  TAKE 1 TABLET BY MOUTH ONCE DAILY. WILL NEED APPOINTMENT FOR MORE REFILLS. Newlin, Enobong, MD  Active   sildenafil  (VIAGRA ) 50 MG tablet [id number] No TAKE 1 TABLET BY MOUTH ONCE DAILY AS NEEDED  FOR ERECTILE DYSFUNCTION AT  LEAST  24  HOURS  BETWEEN  DOSES  Patient taking differently: Take 50 mg by mouth as needed for erectile dysfunction.   Newlin, Enobong, MD Unknown Active Family Member, Self, Care Giver, Pharmacy Records  Med List Note Steffi Nian, CPhT 06/26/24 [id number]): Patient described some health literacy issues so both of his brothers  help him with his medication.            Home Care and Equipment/Supplies: Were Home Health Services Ordered?: Yes Name of Home Health Agency:: [phone] and [facility] noted in chart. Patient declines HH Has Agency set up a time to come to your home?: No Any new equipment or medical supplies ordered?: No  Functional Questionnaire: Do you need assistance with bathing/showering or dressing?: No Do you need assistance with meal preparation?: No Do you need assistance with eating?: No Do you have difficulty maintaining continence: No Do you need assistance with getting out of bed/getting out of a chair/moving?: No Do you have difficulty managing or taking your medications?: No  Follow up appointments reviewed: PCP Follow-up appointment confirmed?: Yes Date of PCP follow-up appointment?: 07/17/24 Follow-up Provider: Dr. Newlin Specialist [facility] Follow-up appointment confirmed?: No Reason Specialist Follow-Up Not Confirmed:  (Patient prefers to attend PCP hospital follow up visit before scheduling) Do you need transportation to your follow-up appointment?: No Do you understand care options if your condition(s) worsen?: Yes-patient verbalized understanding  SDOH Interventions Today    Flowsheet Row Most Recent Value  SDOH Interventions   Food Insecurity Interventions Intervention Not Indicated  Housing Interventions Intervention Not Indicated  Transportation Interventions Intervention Not Indicated  Utilities Interventions Intervention Not Indicated    Andrea Dimes RN, BSN [place]  [facility] RN Care Manager [phone]

## 2024-07-02 NOTE — Transitions of Care (Post Inpatient/ED Visit) (Signed)
   07/02/2024  Name: Martin Mason MRN: [id number] DOB: Jun 11, 1956  Today's TOC FU Call Status: Today's TOC FU Call Status:: Unsuccessful Call (1st Attempt) Unsuccessful Call (1st Attempt) Date: 07/02/24  Attempted to reach the patient regarding the most recent Inpatient/ED visit.  Patient unable to take telephone call at this time and request a call back later this morning.  Follow Up Plan: Additional outreach attempts will be made to reach the patient to complete the Transitions of Care (Post Inpatient/ED visit) call.   Andrea Dimes RN, BSN [place]  [facility] Health RN Care Manager [phone]

## 2024-07-02 NOTE — Progress Notes (Signed)
 Pod B triage, I sent the patient a message through MyChart regarding his gastric biopsies but please call the patient to tell him to stop his oral iron supplement. Let's put in a hematology consult for IDA and consideration of IV iron infusions.

## 2024-07-03 ENCOUNTER — Other Ambulatory Visit: Payer: Self-pay

## 2024-07-03 DIAGNOSIS — D509 Iron deficiency anemia, unspecified: Secondary | ICD-10-CM

## 2024-07-04 ENCOUNTER — Encounter: Payer: Self-pay | Admitting: Physician Assistant

## 2024-07-04 ENCOUNTER — Telehealth: Payer: Self-pay

## 2024-07-04 NOTE — Telephone Encounter (Signed)
 I called [facility] to check on the status of the home health referral.  I spoke to [place] and she said she has no record of the referral.  I explained that I called on 07/01/2024 and spoke to [facility] and was told the same thing and she was going to follow up.  I  told Harlene that per Epic, Amy and Dorothe with [facility] were notified of the referral and Amy accepted   Harlene said she would need to check on this and have someone call me back.

## 2024-07-10 NOTE — Telephone Encounter (Signed)
 I called [facility] again to check on the status of the home health referral because I have not heard back from anyone with [facility].   I spoke to [facility] who still could not find a record of the referral.  I explained to her that I spoke to her on 07/01/2024 and then to [facility] on 07/04/2024 and they were going to follow up with Amy and/ or Dorothe, with the sales team, about the status of the referral but we still don't have a resolution.   I explained that the AVS had the number for [facility] and notes that someone will call him to schedule the first home visit.  Tinelle said the AVS should not have the office number on it because they don't call the patients if they don't have a referral, its the sales team's responsibility.  I asked if they call the sales team at the hospital then to check on the referrals and she said no, they don't  call the sales team at the hospital, they may send them a message.    While I was on the phone, Tinelle spoke to [place] who told her that Amy would have to follow up. Tinelle then told me that Amy should be calling me this afternoon with an update.

## 2024-07-11 NOTE — Telephone Encounter (Signed)
 I called Martin Mason and spoke to [place] who stated they received the referral yesterday and they contacted the patient and he refused services.

## 2024-07-16 ENCOUNTER — Telehealth: Payer: Self-pay | Admitting: Family Medicine

## 2024-07-16 NOTE — Telephone Encounter (Signed)
 Pt confirmed appt

## 2024-07-17 ENCOUNTER — Ambulatory Visit: Payer: Medicare (Managed Care) | Attending: Family Medicine | Admitting: Family Medicine

## 2024-07-17 VITALS — BP 110/55 | HR 82 | Wt 121.8 lb

## 2024-07-17 DIAGNOSIS — I119 Hypertensive heart disease without heart failure: Secondary | ICD-10-CM | POA: Diagnosis not present

## 2024-07-17 DIAGNOSIS — I739 Peripheral vascular disease, unspecified: Secondary | ICD-10-CM

## 2024-07-17 DIAGNOSIS — N179 Acute kidney failure, unspecified: Secondary | ICD-10-CM

## 2024-07-17 DIAGNOSIS — D509 Iron deficiency anemia, unspecified: Secondary | ICD-10-CM

## 2024-07-17 NOTE — Progress Notes (Signed)
 "  Subjective:  Patient ID: Martin Mason, male    DOB: [birth year]  Age: 68 y.o. MRN: [id number]  CC: Medical Management of Chronic Issues (Swelling in legs)     Discussed the use of AI scribe software for clinical note transcription with the patient, who gave verbal consent to proceed.  History of Present Illness Martin Mason is a 68 year old male with a history of congestive heart failure (EF 45 to 50% from echo of 02/2024), hypertension, PAD, tobacco abuse  who presents for hospital follow-up and also with leg swelling.  He was recently hospitalized for anemia with a hemoglobin of 5 and received a blood transfusion, which improved dizziness.  He underwent an EGD which revealed gastritis with recent bleeding and AVM treated with APC.  He was also placed on oral antifungal for esophageal candidiasis.  During this hospitalization, AKI was noted, leading to the discontinuation of lisinopril  and temporary cessation of diuretic medication. His creatinine levels were elevated to 1.8-1.9 mg/dL but trended down to 8.27 at discharge.  He experiences leg swelling that decreases at night. He was advised to see a vascular surgeon, whom he saw on February 18, and was prescribed Pletal . No pain in the back of his legs is present.  He did see cardiology for follow-up of his CHF in 02/2024.  Coronary CT revealed elevated calcium  score of 4071 in the 99th percentile.  He experiences occasional shortness of breath but no chest pain.   His current medications include amlodipine  for blood pressure, furosemide  as a diuretic, medications for acid reflux and fungal infection, and cholesterol medication.    Past Medical History:  Diagnosis Date   Asthma    childhood- young child to age 50   Hypertension    Peripheral vascular disease (HCC)     Past Surgical History:  Procedure Laterality Date   COLONOSCOPY     COLONOSCOPY N/A 06/29/2024   Procedure: COLONOSCOPY;  Surgeon: Federico Rosario BROCKS, MD;  Location: [facility]  ENDOSCOPY;  Service: Gastroenterology;  Laterality: N/A;   CYST EXCISION     throat   ESOPHAGOGASTRODUODENOSCOPY N/A 06/29/2024   Procedure: EGD (ESOPHAGOGASTRODUODENOSCOPY);  Surgeon: Federico Rosario BROCKS, MD;  Location: [facility] ENDOSCOPY;  Service: Gastroenterology;  Laterality: N/A;   TOOTH EXTRACTION N/A 11/06/2020   Procedure: DENTAL RESTORATION/EXTRACTIONS;  Surgeon: Sheryle Hamilton, DDS;  Location: [facility] OR;  Service: Oral Surgery;  Laterality: N/A;    Family History  Problem Relation Age of Onset   Hypertension Mother    Cancer Mother    Hypertension Father    Diabetes Sister     Social History   Socioeconomic History   Marital status: Divorced    Spouse name: Not on file   Number of children: Not on file   Years of education: Not on file   Highest education level: Not on file  Occupational History   Not on file  Tobacco Use   Smoking status: Every Day    Current packs/day: 0.12    Types: Cigarettes   Smokeless tobacco: Never   Tobacco comments:    3 daily  Vaping Use   Vaping status: Never Used  Substance and Sexual Activity   Alcohol use: No    Comment: quit drinking 7 years ago after 30 years of drinking   Drug use: Yes    Types: Marijuana    Comment: last time early November 2021   Sexual activity: Not on file  Other Topics Concern   Not on file  Social History  Narrative   Not on file   Social Drivers of Health   Financial Resource Strain: Low Risk  (02/06/2023)   Overall Financial Resource Strain (CARDIA)    Difficulty of Paying Living Expenses: Not hard at all  Food Insecurity: No Food Insecurity (07/02/2024)   Hunger Vital Sign    Worried About Running Out of Food in the Last Year: Never true    Ran Out of Food in the Last Year: Never true  Transportation Needs: No Transportation Needs (07/02/2024)   PRAPARE - Administrator, Civil Service (Medical): No    Lack of Transportation (Non-Medical): No  Physical Activity: Sufficiently Active (02/06/2023)    Exercise Vital Sign    Days of Exercise per Week: 7 days    Minutes of Exercise per Session: 30 min  Stress: No Stress Concern Present (02/06/2023)   [employer] of Occupational Health - Occupational Stress Questionnaire    Feeling of Stress : Not at all  Social Connections: Patient Declined (06/26/2024)   Social Connection and Isolation Panel    Frequency of Communication with Friends and Family: Patient declined    Frequency of Social Gatherings with Friends and Family: Patient declined    Attends Religious Services: Patient declined    Database Administrator or Organizations: Patient declined    Attends Banker Meetings: Patient declined    Marital Status: Patient declined    No Known Allergies  Outpatient Medications Prior to Visit  Medication Sig Dispense Refill   amLODipine  (NORVASC ) 10 MG tablet Take 1 tablet (10 mg total) by mouth daily. 90 tablet 1   atorvastatin  (LIPITOR) 20 MG tablet Take 1 tablet (20 mg total) by mouth daily. 90 tablet 1   cetirizine  (ZYRTEC ) 10 MG tablet Take 1 tablet (10 mg total) by mouth daily. 90 tablet 1   cholecalciferol  (VITAMIN D3) 10 MCG (400 UNIT) TABS tablet Take 1 tablet (400 Units total) by mouth daily. 30 tablet 0   ferrous sulfate  325 (65 FE) MG tablet Take 1 tablet (325 mg total) by mouth daily. 30 tablet 0   fluconazole  (DIFLUCAN ) 100 MG tablet Take 1 tablet (100 mg total) by mouth daily. 21 tablet 0   fluticasone  (FLONASE ) 50 MCG/ACT nasal spray Use 2 spray(s) in each nostril once daily 48 g 0   furosemide  (LASIX ) 20 MG tablet Take 1 tablet (20 mg total) by mouth daily. 30 tablet 1   lisinopril -hydrochlorothiazide  (ZESTORETIC ) 20-25 MG tablet Take 1 tablet by mouth daily.     pantoprazole  (PROTONIX ) 40 MG tablet Take 1 tablet (40 mg total) by mouth 2 (two) times daily before a meal. Protonix  BID x 10 weeks and then once daily 60 tablet 0   potassium chloride  SA (KLOR-CON  M) 20 MEQ tablet TAKE 1 TABLET BY MOUTH ONCE DAILY.  WILL NEED APPOINTMENT FOR MORE REFILLS. 30 tablet 0   sildenafil  (VIAGRA ) 50 MG tablet TAKE 1 TABLET BY MOUTH ONCE DAILY AS NEEDED FOR ERECTILE DYSFUNCTION AT  LEAST  24  HOURS  BETWEEN  DOSES (Patient taking differently: Take 50 mg by mouth as needed for erectile dysfunction.) 10 tablet 0   No facility-administered medications prior to visit.     ROS Review of Systems  Constitutional:  Negative for activity change and appetite change.  HENT:  Negative for sinus pressure and sore throat.   Respiratory:  Negative for chest tightness, shortness of breath and wheezing.   Cardiovascular:  Positive for leg swelling. Negative for chest pain  and palpitations.  Gastrointestinal:  Negative for abdominal distention, abdominal pain and constipation.  Genitourinary: Negative.   Musculoskeletal: Negative.   Psychiatric/Behavioral:  Negative for behavioral problems and dysphoric mood.     Objective:  BP (!) 110/55   Pulse 82   Wt 121 lb 12.8 oz (55.2 kg)   SpO2 99%   BMI 18.52 kg/m      07/17/2024    2:17 PM 06/30/2024    7:36 AM 06/30/2024    4:42 AM  BP/Weight  Systolic BP 110 130 114  Diastolic BP 55 63 64  Wt. (Lbs) 121.8  107.81  BMI 18.52 kg/m2  16.39 kg/m2      Physical Exam Constitutional:      Appearance: He is well-developed.  Cardiovascular:     Rate and Rhythm: Normal rate.     Heart sounds: Normal heart sounds. No murmur heard. Pulmonary:     Effort: Pulmonary effort is normal.     Breath sounds: Normal breath sounds. No wheezing or rales.  Chest:     Chest wall: No tenderness.  Abdominal:     General: Bowel sounds are normal. There is no distension.     Palpations: Abdomen is soft. There is no mass.     Tenderness: There is no abdominal tenderness.  Musculoskeletal:        General: Normal range of motion.     Right lower leg: Edema present.     Left lower leg: Edema present.  Neurological:     Mental Status: He is alert and oriented to person, place, and time.   Psychiatric:        Mood and Affect: Mood normal.        Latest Ref Rng & Units 07/17/2024    3:17 PM 06/29/2024    3:39 AM 06/28/2024    4:27 AM  CMP  Glucose 70 - 99 mg/dL 77  892  898   BUN 8 - 27 mg/dL 19  20  24    Creatinine 0.76 - 1.27 mg/dL 8.47  8.27  8.18   Sodium 134 - 144 mmol/L 139  133  129   Potassium 3.5 - 5.2 mmol/L 4.6  3.5  3.9   Chloride 96 - 106 mmol/L 106  99  99   CO2 20 - 29 mmol/L 14  21  22    Calcium  8.6 - 10.2 mg/dL 9.1  9.5  8.9   Total Protein 6.0 - 8.5 g/dL 7.5     Total Bilirubin 0.0 - 1.2 mg/dL 0.3     Alkaline Phos 44 - 121 IU/L 92     AST 0 - 40 IU/L 30     ALT 0 - 44 IU/L 13       Lipid Panel     Component Value Date/Time   CHOL 85 02/03/2024 1630   CHOL 156 07/03/2019 1105   TRIG 50 02/03/2024 1630   HDL 43 02/03/2024 1630   HDL 54 07/03/2019 1105   CHOLHDL 2.0 02/03/2024 1630   VLDL 10 02/03/2024 1630   LDLCALC 32 02/03/2024 1630   LDLCALC 89 07/03/2019 1105    CBC    Component Value Date/Time   WBC 6.1 07/17/2024 1517   WBC 8.3 06/30/2024 0418   RBC 3.56 (L) 07/17/2024 1517   RBC 3.66 (L) 06/30/2024 0418   HGB 7.3 (L) 07/17/2024 1517   HCT 25.3 (L) 07/17/2024 1517   PLT 402 07/17/2024 1517   MCV 71 (L) 07/17/2024 1517   MCH  20.5 (L) 07/17/2024 1517   MCH 22.1 (L) 06/30/2024 0418   MCHC 28.9 (L) 07/17/2024 1517   MCHC 30.9 06/30/2024 0418   RDW 22.4 (H) 07/17/2024 1517   LYMPHSABS 1.4 07/17/2024 1517   MONOABS 0.6 02/03/2024 1147   EOSABS 0.2 07/17/2024 1517   BASOSABS 0.1 07/17/2024 1517    Lab Results  Component Value Date   HGBA1C 5.5 12/16/2014       Assessment & Plan Congestive Heart Failure EF 45 to 50% from echo 02/2024 Leg swelling due to heart failure. Diuretics needed but dosage limited by nephrotoxicity risk. - Continue current diuretic therapy. - Advise leg elevation when sitting. - Advise reduction of salt intake. - Consider addition of SGLT2i at next visit - Follow-up with  cardiology  AKI Elevated creatinine (1.8-1.9 mg/dL) but this decreased to 1.72 at discharge. Lisinopril  and diuretics stopped due to nephrotoxicity risk during hospitalization but then restarted. Monitoring required due to diuretic use for heart failure. - Check kidney function today. - Monitor kidney function regularly.  Peripheral Arterial Disease On Pletal , advised smoking cessation. No claudication pain. Compression stockings or leg elevation recommended as alternatives to increased diuretic use. Follow-up with vascular surgery Dr. Gretta  Iron  deficiency anemia Recent endoscopy showed bleeding vessels, treated. Acid reflux medication prescribed. Will check CBC today Continue PPI  Follow-up Follow-up with specialists necessary for ongoing management. - Schedule follow-up with cardiologist. - Ensure follow-up with vascular surgeon as needed.    No orders of the defined types were placed in this encounter.   Follow-up: Return in about 3 months (around 10/17/2024) for Chronic medical conditions.       Corrina Sabin, MD, FAAFP. [facility] [place], [place] [phone]   07/18/2024, 8:47 AM "

## 2024-07-17 NOTE — Patient Instructions (Signed)
 Edema  Edema is when you have too much fluid in your body or under your skin. Edema may make your legs, feet, and ankles swell. Swelling often happens in looser tissues, such as around your eyes. This is a common condition. It gets more common as you get older. There are many possible causes of edema. These include: Eating too much salt (sodium). Being on your feet or sitting for a long time. Certain medical conditions, such as: Pregnancy. Heart failure. Liver disease. Kidney disease. Cancer. Hot weather may make edema worse. Edema is usually painless. Your skin may look swollen or shiny. Follow these instructions at home: Medicines Take over-the-counter and prescription medicines only as told by your doctor. Your doctor may prescribe a medicine to help your body get rid of extra water (diuretic). Take this medicine if you are told to take it. Eating and drinking Eat a low-salt (low-sodium) diet as told by your doctor. Sometimes, eating less salt may reduce swelling. Depending on the cause of your swelling, you may need to limit how much fluid you drink (fluid restriction). General instructions Raise the injured area above the level of your heart while you are sitting or lying down. Do not sit still or stand for a long time. Do not wear tight clothes. Do not wear garters on your upper legs. Exercise your legs. This can help the swelling go down. Wear compression stockings as told by your doctor. It is important that these are the right size. These should be prescribed by your doctor to prevent possible injuries. If elastic bandages or wraps are recommended, use them as told by your doctor. Contact a doctor if: Treatment is not working. You have heart, liver, or kidney disease and have symptoms of edema. You have sudden and unexplained weight gain. Get help right away if: You have shortness of breath or chest pain. You cannot breathe when you lie down. You have pain, redness, or  warmth in the swollen areas. You have heart, liver, or kidney disease and get edema all of a sudden. You have a fever and your symptoms get worse all of a sudden. These symptoms may be an emergency. Get help right away. Call 911. Do not wait to see if the symptoms will go away. Do not drive yourself to the hospital. Summary Edema is when you have too much fluid in your body or under your skin. Edema may make your legs, feet, and ankles swell. Swelling often happens in looser tissues, such as around your eyes. Raise the injured area above the level of your heart while you are sitting or lying down. Follow your doctor's instructions about diet and how much fluid you can drink. This information is not intended to replace advice given to you by your health care provider. Make sure you discuss any questions you have with your health care provider. Document Revised: 08/16/2021 Document Reviewed: 08/16/2021 Elsevier Patient Education  2024 [employer].

## 2024-07-18 ENCOUNTER — Ambulatory Visit: Payer: Self-pay | Admitting: Family Medicine

## 2024-07-18 ENCOUNTER — Encounter: Payer: Self-pay | Admitting: Family Medicine

## 2024-07-18 LAB — CBC WITH DIFFERENTIAL/PLATELET
Basophils Absolute: 0.1 x10E3/uL (ref 0.0–0.2)
Basos: 1 %
EOS (ABSOLUTE): 0.2 x10E3/uL (ref 0.0–0.4)
Eos: 3 %
Hematocrit: 25.3 % — ABNORMAL LOW (ref 37.5–51.0)
Hemoglobin: 7.3 g/dL — ABNORMAL LOW (ref 13.0–17.7)
Immature Grans (Abs): 0 x10E3/uL (ref 0.0–0.1)
Immature Granulocytes: 0 %
Lymphocytes Absolute: 1.4 x10E3/uL (ref 0.7–3.1)
Lymphs: 22 %
MCH: 20.5 pg — ABNORMAL LOW (ref 26.6–33.0)
MCHC: 28.9 g/dL — ABNORMAL LOW (ref 31.5–35.7)
MCV: 71 fL — ABNORMAL LOW (ref 79–97)
Monocytes Absolute: 0.6 x10E3/uL (ref 0.1–0.9)
Monocytes: 11 %
Neutrophils Absolute: 3.8 x10E3/uL (ref 1.4–7.0)
Neutrophils: 63 %
Platelets: 402 x10E3/uL (ref 150–450)
RBC: 3.56 x10E6/uL — ABNORMAL LOW (ref 4.14–5.80)
RDW: 22.4 % — ABNORMAL HIGH (ref 11.6–15.4)
WBC: 6.1 x10E3/uL (ref 3.4–10.8)

## 2024-07-18 LAB — CMP14+EGFR
ALT: 13 IU/L (ref 0–44)
AST: 30 IU/L (ref 0–40)
Albumin: 4.2 g/dL (ref 3.9–4.9)
Alkaline Phosphatase: 92 IU/L (ref 44–121)
BUN/Creatinine Ratio: 13 (ref 10–24)
BUN: 19 mg/dL (ref 8–27)
Bilirubin Total: 0.3 mg/dL (ref 0.0–1.2)
CO2: 14 mmol/L — ABNORMAL LOW (ref 20–29)
Calcium: 9.1 mg/dL (ref 8.6–10.2)
Chloride: 106 mmol/L (ref 96–106)
Creatinine, Ser: 1.52 mg/dL — ABNORMAL HIGH (ref 0.76–1.27)
Globulin, Total: 3.3 g/dL (ref 1.5–4.5)
Glucose: 77 mg/dL (ref 70–99)
Potassium: 4.6 mmol/L (ref 3.5–5.2)
Sodium: 139 mmol/L (ref 134–144)
Total Protein: 7.5 g/dL (ref 6.0–8.5)
eGFR: 49 mL/min/1.73 — ABNORMAL LOW (ref >59)

## 2024-07-18 MED ORDER — FERROUS SULFATE 325 (65 FE) MG PO TABS: 325.0000 mg | ORAL_TABLET | Freq: Two times a day (BID) | ORAL | 1 refills | Status: DC

## 2024-07-23 ENCOUNTER — Other Ambulatory Visit: Payer: Self-pay

## 2024-07-23 MED ORDER — FERROUS SULFATE 325 (65 FE) MG PO TABS: 325.0000 mg | ORAL_TABLET | Freq: Two times a day (BID) | ORAL | 1 refills | Status: AC

## 2024-08-01 ENCOUNTER — Ambulatory Visit: Payer: Self-pay

## 2024-08-02 ENCOUNTER — Other Ambulatory Visit: Payer: Self-pay | Admitting: Family Medicine

## 2024-08-09 ENCOUNTER — Telehealth: Payer: Self-pay

## 2024-08-09 ENCOUNTER — Inpatient Hospital Stay: Payer: Medicare (Managed Care) | Admitting: Oncology

## 2024-08-09 ENCOUNTER — Inpatient Hospital Stay: Payer: Medicare (Managed Care)

## 2024-08-09 NOTE — Telephone Encounter (Signed)
 Left a voicemail following up on patient's missed New Patient Hem appt with Dr. Autumn. Did mention that the referral was marked as urgent and left phone number for patient to call. Also informed patient that scheduling would be reaching out to him to reschedule.

## 2024-08-13 ENCOUNTER — Other Ambulatory Visit: Payer: Self-pay

## 2024-08-13 ENCOUNTER — Telehealth: Payer: Self-pay

## 2024-08-13 NOTE — Progress Notes (Signed)
 Per scheduling dept, patient has not answered his phone or returned any calls to get patient set up with Dr. Autumn as an urgent referral. All attempts/efforts made to get scheduled.

## 2024-08-17 ENCOUNTER — Other Ambulatory Visit: Payer: Self-pay | Admitting: Family Medicine

## 2024-08-17 DIAGNOSIS — I10 Essential (primary) hypertension: Secondary | ICD-10-CM

## 2024-08-19 NOTE — Telephone Encounter (Signed)
 Requested Prescriptions  Pending Prescriptions Disp Refills   amLODipine  (NORVASC ) 10 MG tablet [Pharmacy Med Name: amLODIPine  Besylate 10 MG Oral Tablet] 90 tablet 1    Sig: Take 1 tablet by mouth once daily     Cardiovascular: Calcium  Channel Blockers 2 Passed - 08/19/2024  2:01 PM      Passed - Last BP in normal range    BP Readings from Last 1 Encounters:  07/17/24 (!) 110/55         Passed - Last Heart Rate in normal range    Pulse Readings from Last 1 Encounters:  07/17/24 82         Passed - Valid encounter within last 6 months    Recent Outpatient Visits           1 month ago Hypertensive heart disease without heart failure   [facility] - [facility] Dept Of [place]. [facility] Delbert Clam, MD   7 months ago PAD (peripheral artery disease) [facility])   [facility] Dept Of [place]. [facility] Delbert Clam, MD   1 year ago Medication management   [facility] - A Dept Of [place]. [facility] Tonia Garnette LITTIE, RPH-CPP   1 year ago Essential hypertension   [facility] - A Dept Of [place]. [facility] Delbert Clam, MD   1 year ago Tobacco dependence    [facility] - A Dept Of . [facility] Delbert Clam, MD

## 2024-08-20 NOTE — Telephone Encounter (Signed)
 Opened in error

## 2024-08-27 NOTE — Progress Notes (Deleted)
 Ellouise Console, PA-C [address] [place], [place]  [address] Phone: [phone]   Primary Care Physician: Delbert Clam, MD  Primary Gastroenterologist:  Ellouise Console, PA-C /Dr. Federico, Dr. Leigh  Chief Complaint: Hospital follow-up gastritis, esophagitis, and IDA   HPI:   Martin Mason is a 68 y.o. male presents for hospital follow-up of acute blood loss anemia from GI bleed.  He was hospitalized 06/25/2024 until 06/30/2024.  Initially presented with hemoglobin of 5G.  Received 1 unit PRBC.  Heme-negative.  Iron studies showed severe iron deficiency anemia.  Aspirin  was held.  Started on Protonix  40 Mg twice daily for 10 weeks, then once daily.  Was on ferrous sulfate  325 mg iron tablet once daily.  06/29/2024 EGD by Dr. 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 Gastric biopsies taken to rule out H.pylori.  Candidal esophagitis treated with oral fluconazole  100 mg x 21 days.  Gastric biopsies showed marked reactive gastropathy with erosions and subepithelial crystalline deposits, suggestive of iron pill gastropathy.  Biopsies negative for H. pylori.  He was advised to stop oral iron and referred for IV iron.  06/29/2024 colonoscopy by Dr. Federico in hospital: Nonbleeding internal hemorrhoids, otherwise normal.  Excellent prep.  No polyps.  10-year repeat.  PMH: COPD, CHF, CKD, PVD, tobacco abuse, hypertension, hyperlipidemia, PAD, chronic HFmrEF, LVEF 41 to 49%, iron deficiency anemia, and alcohol abuse in remission.  Current Outpatient Medications  Medication Sig Dispense Refill   amLODipine  (NORVASC ) 10 MG tablet Take 1 tablet by mouth once daily 90 tablet 1   atorvastatin  (LIPITOR) 20 MG tablet Take 1 tablet (20 mg total) by mouth daily. 90 tablet 1   cetirizine  (ZYRTEC ) 10 MG tablet Take 1 tablet (10 mg total) by mouth daily. 90 tablet 1   cholecalciferol  (VITAMIN D3) 10 MCG (400  UNIT) TABS tablet Take 1 tablet (400 Units total) by mouth daily. 30 tablet 0   ferrous sulfate  325 (65 FE) MG tablet Take 1 tablet (325 mg total) by mouth 2 (two) times daily with a meal. 60 tablet 1   fluticasone  (FLONASE ) 50 MCG/ACT nasal spray Use 2 spray(s) in each nostril once daily 48 g 0   furosemide  (LASIX ) 20 MG tablet Take 1 tablet (20 mg total) by mouth daily. 30 tablet 1   lisinopril -hydrochlorothiazide  (ZESTORETIC ) 20-25 MG tablet Take 1 tablet by mouth daily.     pantoprazole  (PROTONIX ) 40 MG tablet Take 1 tablet (40 mg total) by mouth 2 (two) times daily before a meal. Protonix  BID x 10 weeks and then once daily 60 tablet 0   potassium chloride  SA (KLOR-CON  M) 20 MEQ tablet Take 1 tablet (20 mEq total) by mouth daily. 90 tablet 1   sildenafil  (VIAGRA ) 50 MG tablet TAKE 1 TABLET BY MOUTH ONCE DAILY AS NEEDED FOR ERECTILE DYSFUNCTION AT  LEAST  24  HOURS  BETWEEN  DOSES 10 tablet 0   No current facility-administered medications for this visit.    Allergies as of 08/28/2024   (No Known Allergies)    Past Medical History:  Diagnosis Date   Asthma    childhood- young child to age 10   Hypertension    Peripheral vascular disease (HCC)     Past Surgical History:  Procedure Laterality Date   COLONOSCOPY     COLONOSCOPY N/A 06/29/2024   Procedure: COLONOSCOPY;  Surgeon: Federico Rosario BROCKS, MD;  Location: [facility] ENDOSCOPY;  Service: Gastroenterology;  Laterality: N/A;   CYST EXCISION     throat   ESOPHAGOGASTRODUODENOSCOPY N/A 06/29/2024   Procedure: EGD (ESOPHAGOGASTRODUODENOSCOPY);  Surgeon: Federico Rosario BROCKS, MD;  Location: [facility] ENDOSCOPY;  Service: Gastroenterology;  Laterality: N/A;   TOOTH EXTRACTION N/A 11/06/2020   Procedure: DENTAL RESTORATION/EXTRACTIONS;  Surgeon: Sheryle Hamilton, DDS;  Location: [facility] OR;  Service: Oral Surgery;  Laterality: N/A;    Review of Systems:    All systems reviewed and negative except where noted in HPI.    Physical Exam:  There were no vitals taken  for this visit. No LMP for male patient.  General: Well-nourished, well-developed in no acute distress.  Lungs: Clear to auscultation bilaterally. Non-labored. Heart: Regular rate and rhythm, no murmurs rubs or gallops.  Abdomen: Bowel sounds are normal; Abdomen is Soft; No hepatosplenomegaly, masses or hernias;  No Abdominal Tenderness; No guarding or rebound tenderness. Neuro: Alert and oriented x 3.  Grossly intact.  Psych: Alert and cooperative, normal mood and affect.   Imaging Studies: No results found.  Labs: CBC    Component Value Date/Time   WBC 6.1 07/17/2024 1517   WBC 8.3 06/30/2024 0418   RBC 3.56 (L) 07/17/2024 1517   RBC 3.66 (L) 06/30/2024 0418   HGB 7.3 (L) 07/17/2024 1517   HCT 25.3 (L) 07/17/2024 1517   PLT 402 07/17/2024 1517   MCV 71 (L) 07/17/2024 1517   MCH 20.5 (L) 07/17/2024 1517   MCH 22.1 (L) 06/30/2024 0418   MCHC 28.9 (L) 07/17/2024 1517   MCHC 30.9 06/30/2024 0418   RDW 22.4 (H) 07/17/2024 1517   LYMPHSABS 1.4 07/17/2024 1517   MONOABS 0.6 02/03/2024 1147   EOSABS 0.2 07/17/2024 1517   BASOSABS 0.1 07/17/2024 1517    CMP     Component Value Date/Time   NA 139 07/17/2024 1517   K 4.6 07/17/2024 1517   CL 106 07/17/2024 1517   CO2 14 (L) 07/17/2024 1517   GLUCOSE 77 07/17/2024 1517   GLUCOSE 107 (H) 06/29/2024 0339   BUN 19 07/17/2024 1517   CREATININE 1.52 (H) 07/17/2024 1517   CREATININE 1.24 03/01/2017 1229   CALCIUM  9.1 07/17/2024 1517   PROT 7.5 07/17/2024 1517   ALBUMIN  4.2 07/17/2024 1517   AST 30 07/17/2024 1517   ALT 13 07/17/2024 1517   ALKPHOS 92 07/17/2024 1517   BILITOT 0.3 07/17/2024 1517   GFRNONAA 42 (L) 06/29/2024 0339   GFRNONAA 62 03/01/2017 1229   GFRAA 80 11/12/2020 1057   GFRAA 72 03/01/2017 1229       Assessment and Plan:   Martin Mason is a 68 y.o. y/o male presents for hospital follow-up of GI bleed and acute blood loss anemia.  Upper GI bleed thought secondary to gastritis, small bowel AVMs, and  esophageal candidiasis.  History of tobacco and alcohol dependence.  1.  Erosive gastritis (H. pylori negative) - Avoid NSAIDs, and alcohol. - Tobacco cessation discussed - Continue PPI - Schedule repeat EGD   2.  Gastric AVMs treated with APC - Schedule repeat EGD  3.  Esophageal candidiasis   4.  Acute blood loss anemia/iron deficiency anemia - Labs: CBC, iron panel, ferritin, B12, folate, and celiac lab.  - Refer for IV iron   5.  Comorbidities: COPD, CHF, CKD, PVD, tobacco abuse, hypertension, hyperlipidemia, PAD, chronic HFmrEF, LVEF 41 to 49%, iron deficiency anemia, and alcohol abuse in remission.    Ellouise Console, PA-C  Follow up ***

## 2024-08-28 ENCOUNTER — Ambulatory Visit: Payer: Medicare (Managed Care) | Admitting: Physician Assistant

## 2024-09-03 ENCOUNTER — Emergency Department (HOSPITAL_COMMUNITY): Payer: Medicare (Managed Care)

## 2024-09-03 ENCOUNTER — Inpatient Hospital Stay (HOSPITAL_COMMUNITY)
Admission: EM | Admit: 2024-09-03 | Discharge: 2024-09-10 | DRG: 811 | Disposition: A | Discharge status: Skilled Nursing Facility | Payer: Medicare (Managed Care) | Attending: Internal Medicine | Admitting: Internal Medicine

## 2024-09-03 ENCOUNTER — Other Ambulatory Visit: Payer: Self-pay

## 2024-09-03 ENCOUNTER — Inpatient Hospital Stay (HOSPITAL_COMMUNITY): Payer: Medicare (Managed Care)

## 2024-09-03 DIAGNOSIS — Z741 Need for assistance with personal care: Secondary | ICD-10-CM | POA: Diagnosis not present

## 2024-09-03 DIAGNOSIS — R1310 Dysphagia, unspecified: Secondary | ICD-10-CM | POA: Diagnosis not present

## 2024-09-03 DIAGNOSIS — I10 Essential (primary) hypertension: Secondary | ICD-10-CM | POA: Diagnosis not present

## 2024-09-03 DIAGNOSIS — N183 Chronic kidney disease, stage 3 unspecified: Secondary | ICD-10-CM | POA: Diagnosis present

## 2024-09-03 DIAGNOSIS — E871 Hypo-osmolality and hyponatremia: Secondary | ICD-10-CM | POA: Diagnosis present

## 2024-09-03 DIAGNOSIS — E559 Vitamin D deficiency, unspecified: Secondary | ICD-10-CM | POA: Diagnosis present

## 2024-09-03 DIAGNOSIS — N1832 Chronic kidney disease, stage 3b: Secondary | ICD-10-CM | POA: Diagnosis present

## 2024-09-03 DIAGNOSIS — R55 Syncope and collapse: Secondary | ICD-10-CM | POA: Diagnosis not present

## 2024-09-03 DIAGNOSIS — N1831 Chronic kidney disease, stage 3a: Secondary | ICD-10-CM | POA: Diagnosis not present

## 2024-09-03 DIAGNOSIS — K5521 Angiodysplasia of colon with hemorrhage: Secondary | ICD-10-CM | POA: Diagnosis present

## 2024-09-03 DIAGNOSIS — E785 Hyperlipidemia, unspecified: Secondary | ICD-10-CM | POA: Diagnosis present

## 2024-09-03 DIAGNOSIS — Z8249 Family history of ischemic heart disease and other diseases of the circulatory system: Secondary | ICD-10-CM

## 2024-09-03 DIAGNOSIS — I130 Hypertensive heart and chronic kidney disease with heart failure and stage 1 through stage 4 chronic kidney disease, or unspecified chronic kidney disease: Secondary | ICD-10-CM | POA: Diagnosis not present

## 2024-09-03 DIAGNOSIS — R569 Unspecified convulsions: Secondary | ICD-10-CM | POA: Diagnosis not present

## 2024-09-03 DIAGNOSIS — I739 Peripheral vascular disease, unspecified: Secondary | ICD-10-CM | POA: Diagnosis not present

## 2024-09-03 DIAGNOSIS — I5022 Chronic systolic (congestive) heart failure: Secondary | ICD-10-CM | POA: Diagnosis not present

## 2024-09-03 DIAGNOSIS — Z7401 Bed confinement status: Secondary | ICD-10-CM | POA: Diagnosis not present

## 2024-09-03 DIAGNOSIS — I70219 Atherosclerosis of native arteries of extremities with intermittent claudication, unspecified extremity: Secondary | ICD-10-CM | POA: Diagnosis not present

## 2024-09-03 DIAGNOSIS — F17210 Nicotine dependence, cigarettes, uncomplicated: Secondary | ICD-10-CM | POA: Diagnosis present

## 2024-09-03 DIAGNOSIS — Z833 Family history of diabetes mellitus: Secondary | ICD-10-CM

## 2024-09-03 DIAGNOSIS — D649 Anemia, unspecified: Secondary | ICD-10-CM | POA: Diagnosis present

## 2024-09-03 DIAGNOSIS — E876 Hypokalemia: Secondary | ICD-10-CM | POA: Diagnosis not present

## 2024-09-03 DIAGNOSIS — B3781 Candidal esophagitis: Secondary | ICD-10-CM | POA: Diagnosis present

## 2024-09-03 DIAGNOSIS — J45909 Unspecified asthma, uncomplicated: Secondary | ICD-10-CM | POA: Diagnosis not present

## 2024-09-03 DIAGNOSIS — E872 Acidosis, unspecified: Secondary | ICD-10-CM | POA: Diagnosis present

## 2024-09-03 DIAGNOSIS — Z720 Tobacco use: Secondary | ICD-10-CM | POA: Diagnosis not present

## 2024-09-03 DIAGNOSIS — D509 Iron deficiency anemia, unspecified: Secondary | ICD-10-CM | POA: Diagnosis not present

## 2024-09-03 DIAGNOSIS — Z9181 History of falling: Secondary | ICD-10-CM | POA: Diagnosis not present

## 2024-09-03 DIAGNOSIS — R531 Weakness: Secondary | ICD-10-CM | POA: Diagnosis not present

## 2024-09-03 DIAGNOSIS — N528 Other male erectile dysfunction: Secondary | ICD-10-CM | POA: Diagnosis not present

## 2024-09-03 DIAGNOSIS — R41841 Cognitive communication deficit: Secondary | ICD-10-CM | POA: Diagnosis not present

## 2024-09-03 DIAGNOSIS — W19XXXA Unspecified fall, initial encounter: Secondary | ICD-10-CM | POA: Diagnosis not present

## 2024-09-03 DIAGNOSIS — I5023 Acute on chronic systolic (congestive) heart failure: Secondary | ICD-10-CM | POA: Diagnosis not present

## 2024-09-03 DIAGNOSIS — R609 Edema, unspecified: Secondary | ICD-10-CM | POA: Diagnosis not present

## 2024-09-03 DIAGNOSIS — F1020 Alcohol dependence, uncomplicated: Secondary | ICD-10-CM | POA: Diagnosis not present

## 2024-09-03 DIAGNOSIS — R278 Other lack of coordination: Secondary | ICD-10-CM | POA: Diagnosis not present

## 2024-09-03 LAB — FOLATE: Folate: 16.9 ng/mL (ref >5.9)

## 2024-09-03 LAB — I-STAT CHEM 8, ED
BUN: 22 mg/dL (ref 8–23)
Calcium, Ion: 1.21 mmol/L (ref 1.15–1.40)
Chloride: 102 mmol/L (ref 98–111)
Creatinine, Ser: 1.8 mg/dL — ABNORMAL HIGH (ref 0.61–1.24)
Glucose, Bld: 84 mg/dL (ref 70–99)
HCT: 21 % — ABNORMAL LOW (ref 39.0–52.0)
Hemoglobin: 7.1 g/dL — ABNORMAL LOW (ref 13.0–17.0)
Potassium: 3.5 mmol/L (ref 3.5–5.1)
Sodium: 136 mmol/L (ref 135–145)
TCO2: 22 mmol/L (ref 22–32)

## 2024-09-03 LAB — BASIC METABOLIC PANEL WITH GFR
Anion gap: 12 (ref 5–15)
BUN: 23 mg/dL (ref 8–23)
CO2: 20 mmol/L — ABNORMAL LOW (ref 22–32)
Calcium: 9 mg/dL (ref 8.9–10.3)
Chloride: 102 mmol/L (ref 98–111)
Creatinine, Ser: 1.53 mg/dL — ABNORMAL HIGH (ref 0.61–1.24)
GFR, Estimated: 49 mL/min — ABNORMAL LOW (ref >60)
Glucose, Bld: 85 mg/dL (ref 70–99)
Potassium: 3.4 mmol/L — ABNORMAL LOW (ref 3.5–5.1)
Sodium: 134 mmol/L — ABNORMAL LOW (ref 135–145)

## 2024-09-03 LAB — CBC
HCT: 21.2 % — ABNORMAL LOW (ref 39.0–52.0)
Hemoglobin: 5.8 g/dL — CL (ref 13.0–17.0)
MCH: 17.9 pg — ABNORMAL LOW (ref 26.0–34.0)
MCHC: 27.4 g/dL — ABNORMAL LOW (ref 30.0–36.0)
MCV: 65.4 fL — ABNORMAL LOW (ref 80.0–100.0)
Platelets: 327 K/uL (ref 150–400)
RBC: 3.24 MIL/uL — ABNORMAL LOW (ref 4.22–5.81)
RDW: 24.1 % — ABNORMAL HIGH (ref 11.5–15.5)
WBC: 6.9 K/uL (ref 4.0–10.5)
nRBC: 0.3 % — ABNORMAL HIGH (ref 0.0–0.2)

## 2024-09-03 LAB — RETICULOCYTES
Immature Retic Fract: 30.8 % — ABNORMAL HIGH (ref 2.3–15.9)
RBC.: 3.41 MIL/uL — ABNORMAL LOW (ref 4.22–5.81)
Retic Count, Absolute: 57.6 K/uL (ref 19.0–186.0)
Retic Ct Pct: 1.7 % (ref 0.4–3.1)

## 2024-09-03 LAB — IRON AND TIBC
Iron: 12 ug/dL — ABNORMAL LOW (ref 45–182)
Saturation Ratios: 3 % — ABNORMAL LOW (ref 17.9–39.5)
TIBC: 357 ug/dL (ref 250–450)
UIBC: 345 ug/dL

## 2024-09-03 LAB — PREPARE RBC (CROSSMATCH)

## 2024-09-03 LAB — POC OCCULT BLOOD, ED: Fecal Occult Bld: NEGATIVE

## 2024-09-03 LAB — FERRITIN: Ferritin: 8 ng/mL — ABNORMAL LOW (ref 24–336)

## 2024-09-03 LAB — VITAMIN B12: Vitamin B-12: 420 pg/mL (ref 180–914)

## 2024-09-03 MED ORDER — PANTOPRAZOLE SODIUM 40 MG IV SOLR
40.0000 mg | Freq: Two times a day (BID) | INTRAVENOUS | Status: DC
  Administered 2024-09-03 – 2024-09-07 (×8): 40 mg via INTRAVENOUS
  Filled 2024-09-03 (×8): qty 10

## 2024-09-03 MED ORDER — PANTOPRAZOLE SODIUM 40 MG IV SOLR
40.0000 mg | Freq: Once | INTRAVENOUS | Status: AC
  Administered 2024-09-03: 40 mg via INTRAVENOUS
  Filled 2024-09-03: qty 10

## 2024-09-03 MED ORDER — POTASSIUM CHLORIDE CRYS ER 20 MEQ PO TBCR
20.0000 meq | EXTENDED_RELEASE_TABLET | Freq: Every day | ORAL | Status: DC
  Administered 2024-09-04 – 2024-09-10 (×7): 20 meq via ORAL
  Filled 2024-09-03 (×7): qty 1

## 2024-09-03 MED ORDER — CYANOCOBALAMIN 1000 MCG/ML IJ SOLN
1000.0000 ug | Freq: Every day | INTRAMUSCULAR | Status: AC
  Administered 2024-09-03 – 2024-09-09 (×7): 1000 ug via SUBCUTANEOUS
  Filled 2024-09-03 (×7): qty 1

## 2024-09-03 MED ORDER — FUROSEMIDE 10 MG/ML IJ SOLN
20.0000 mg | Freq: Once | INTRAMUSCULAR | Status: AC
  Administered 2024-09-03: 20 mg via INTRAVENOUS
  Filled 2024-09-03: qty 2

## 2024-09-03 MED ORDER — CHOLECALCIFEROL 10 MCG (400 UNIT) PO TABS
400.0000 [IU] | ORAL_TABLET | Freq: Every day | ORAL | Status: DC
  Administered 2024-09-05 – 2024-09-10 (×6): 400 [IU] via ORAL
  Filled 2024-09-03 (×7): qty 1

## 2024-09-03 MED ORDER — METHOCARBAMOL 500 MG PO TABS
500.0000 mg | ORAL_TABLET | Freq: Three times a day (TID) | ORAL | Status: DC | PRN
  Administered 2024-09-03 – 2024-09-04 (×3): 500 mg via ORAL
  Filled 2024-09-03 (×3): qty 1

## 2024-09-03 MED ORDER — FUROSEMIDE 10 MG/ML IJ SOLN
20.0000 mg | Freq: Once | INTRAMUSCULAR | Status: AC
Start: 2024-09-03 — End: 2024-09-04
  Administered 2024-09-04: 20 mg via INTRAVENOUS
  Filled 2024-09-03: qty 2

## 2024-09-03 MED ORDER — ACETAMINOPHEN 325 MG PO TABS
650.0000 mg | ORAL_TABLET | Freq: Four times a day (QID) | ORAL | Status: DC | PRN
  Administered 2024-09-03: 650 mg via ORAL
  Filled 2024-09-03: qty 2

## 2024-09-03 MED ORDER — ACETAMINOPHEN 650 MG RE SUPP: 650.0000 mg | Freq: Four times a day (QID) | RECTAL | Status: DC | PRN

## 2024-09-03 MED ORDER — FLUTICASONE PROPIONATE 50 MCG/ACT NA SUSP
1.0000 | Freq: Every day | NASAL | Status: DC
  Administered 2024-09-05 – 2024-09-09 (×5): 1 via NASAL
  Filled 2024-09-03: qty 16

## 2024-09-03 MED ORDER — NALOXONE HCL 0.4 MG/ML IJ SOLN: 0.4000 mg | INTRAMUSCULAR | Status: DC | PRN

## 2024-09-03 MED ORDER — SODIUM CHLORIDE 0.9% IV SOLUTION: Freq: Once | INTRAVENOUS | Status: AC

## 2024-09-03 MED ORDER — POTASSIUM CHLORIDE CRYS ER 20 MEQ PO TBCR
40.0000 meq | EXTENDED_RELEASE_TABLET | Freq: Once | ORAL | Status: AC
  Administered 2024-09-03: 40 meq via ORAL
  Filled 2024-09-03: qty 2

## 2024-09-03 MED ORDER — ATORVASTATIN CALCIUM 10 MG PO TABS
20.0000 mg | ORAL_TABLET | Freq: Every day | ORAL | Status: DC
  Administered 2024-09-04 – 2024-09-10 (×7): 20 mg via ORAL
  Filled 2024-09-03 (×7): qty 2

## 2024-09-03 MED ORDER — HYDROCODONE-ACETAMINOPHEN 5-325 MG PO TABS
1.0000 | ORAL_TABLET | Freq: Four times a day (QID) | ORAL | Status: DC | PRN
  Administered 2024-09-03 – 2024-09-07 (×4): 1 via ORAL
  Filled 2024-09-03 (×4): qty 1

## 2024-09-03 MED ORDER — PANTOPRAZOLE SODIUM 40 MG PO TBEC: 40.0000 mg | DELAYED_RELEASE_TABLET | Freq: Two times a day (BID) | ORAL | Status: DC

## 2024-09-03 MED ORDER — ALBUTEROL SULFATE (2.5 MG/3ML) 0.083% IN NEBU: 2.5000 mg | INHALATION_SOLUTION | RESPIRATORY_TRACT | Status: DC | PRN

## 2024-09-03 MED ORDER — NICOTINE 21 MG/24HR TD PT24
21.0000 mg | MEDICATED_PATCH | Freq: Every day | TRANSDERMAL | Status: DC
  Administered 2024-09-03 – 2024-09-10 (×8): 21 mg via TRANSDERMAL
  Filled 2024-09-03 (×8): qty 1

## 2024-09-03 NOTE — ED Triage Notes (Signed)
 Patient arrives via [facility] from home for possible sepsis for possible fever and capnography. Patient called for possible seizures. Conscious entire time of transport, no reported seizure activity per brother. Patient endorses left hip and left flank pain after fall last night. Patient ambulated to ambulate. Bed bugs caught by EMS and collected in container at bedside.   EMS vitals  100/54 BP RR 20  Sinus on 12 lead with PVCs, irregular pulse CBG 108 O2 97 on room air Hot to touch. 650 tylenol  en route.  Capno 18  X3 failed attempts for peripheral IV access.

## 2024-09-03 NOTE — Progress Notes (Signed)
 EEG complete - results pending

## 2024-09-03 NOTE — H&P (Signed)
 [facility] H&P   Patient Demographics:    Martin Mason, is a 68 y.o. male  MRN: [id number]   DOB - 1956-06-26  Admit Date - 09/03/2024  Outpatient Primary MD for the patient is Newlin, Enobong, MD  Referring MD/NP/PA: PA Minnie  Patient coming from: home  Chief Complaint  Patient presents with   Fall      HPI:    Martin Mason  is a 68 y.o. male, with medical history significant for hypertension, hyperlipidemia, PAD, chronic HFmrEF, iron  deficiency anemia, and alcohol abuse in remission, with patient with recent hospitalization July 2025 significant for anemia, workup significant for unremarkable colonoscopy, and endoscopy significant for AVM status post APC, candidal esophagitis and gastritis, treated with antifungal, iron  and PPI. - Patient is poor historian, he presents to ED secondary to complaints of fall, reports he had a fall while watching TV, reports he fell on the ground, he reports he did not lose consciousness, but he does report some dizziness, he does mention jerking movement motions, but reports he was awake and alert through the entire event, no history of seizures, he denies any urinary or stool incontinence, reports last alcohol drink was many years ago,.  He denies any melena, coffee-ground emesis or any NSAID use. - In ED his workup significant for potassium 3.4, sodium 134, CO2 of 20, creatinine at baseline 1.53, CT head with no acute findings, he was noted to have hemoglobin of 5.8, Hemoccult was negative.SABRA  Hospitalist consulted to admit     Review of systems:      A full 10 point Review of Systems was done, except as stated above, all other Review of Systems were negative.   With Past History of the following :    Past Medical History:  Diagnosis Date   Asthma    childhood- young child to age 4   Hypertension    Peripheral vascular disease (HCC)       Past  Surgical History:  Procedure Laterality Date   COLONOSCOPY     COLONOSCOPY N/A 06/29/2024   Procedure: COLONOSCOPY;  Surgeon: Federico Rosario BROCKS, MD;  Location: [facility] ENDOSCOPY;  Service: Gastroenterology;  Laterality: N/A;   CYST EXCISION     throat   ESOPHAGOGASTRODUODENOSCOPY N/A 06/29/2024   Procedure: EGD (ESOPHAGOGASTRODUODENOSCOPY);  Surgeon: Federico Rosario BROCKS, MD;  Location: [facility] ENDOSCOPY;  Service: Gastroenterology;  Laterality: N/A;   TOOTH EXTRACTION N/A 11/06/2020   Procedure: DENTAL RESTORATION/EXTRACTIONS;  Surgeon: Sheryle Hamilton, DDS;  Location: [facility] OR;  Service: Oral Surgery;  Laterality: N/A;      Social History:     Social History   Tobacco Use   Smoking status: Every Day    Current packs/day: 0.12    Types: Cigarettes   Smokeless tobacco: Never   Tobacco comments:    3 daily  Substance Use Topics   Alcohol use: No    Comment: quit  drinking 7 years ago after 30 years of drinking       Family History :     Family History  Problem Relation Age of Onset   Hypertension Mother    Cancer Mother    Hypertension Father    Diabetes Sister     Home Medications:   Prior to Admission medications   Medication Sig Start Date End Date Taking? Authorizing Provider  amLODipine  (NORVASC ) 10 MG tablet Take 1 tablet by mouth once daily 08/19/24   Newlin, Enobong, MD  atorvastatin  (LIPITOR) 20 MG tablet Take 1 tablet (20 mg total) by mouth daily. 01/17/24   Newlin, Enobong, MD  cetirizine  (ZYRTEC ) 10 MG tablet Take 1 tablet (10 mg total) by mouth daily. 03/13/24   Newlin, Enobong, MD  cholecalciferol  (VITAMIN D3) 10 MCG (400 UNIT) TABS tablet Take 1 tablet (400 Units total) by mouth daily. 07/01/24   Dino Antu, MD  ferrous sulfate  325 (65 FE) MG tablet Take 1 tablet (325 mg total) by mouth 2 (two) times daily with a meal. 07/23/24   Delbert Clam, MD  fluticasone  (FLONASE ) 50 MCG/ACT nasal spray Use 2 spray(s) in each nostril once daily 07/01/24   Newlin, Enobong, MD  furosemide   (LASIX ) 20 MG tablet Take 1 tablet (20 mg total) by mouth daily. 07/01/24   Newlin, Enobong, MD  lisinopril -hydrochlorothiazide  (ZESTORETIC ) 20-25 MG tablet Take 1 tablet by mouth daily. 04/17/24   [provider]  pantoprazole  (PROTONIX ) 40 MG tablet Take 1 tablet (40 mg total) by mouth 2 (two) times daily before a meal. Protonix  BID x 10 weeks and then once daily 06/30/24   Rashid, Farhan, MD  potassium chloride  SA (KLOR-CON  M) 20 MEQ tablet Take 1 tablet (20 mEq total) by mouth daily. 08/02/24   Newlin, Enobong, MD  sildenafil  (VIAGRA ) 50 MG tablet TAKE 1 TABLET BY MOUTH ONCE DAILY AS NEEDED FOR ERECTILE DYSFUNCTION AT  LEAST  24  HOURS  BETWEEN  DOSES 08/17/23   Delbert Clam, MD     Allergies:    No Known Allergies   Physical Exam:   Vitals  Blood pressure 107/70, pulse 77, temperature 97.9 F (36.6 C), resp. rate 17, height 5' 8 (1.727 m), weight 55.2 kg, SpO2 90%.   1. General Frail elderly male, laying in bed, no apparent distress  2. Normal affect and insight, Not Suicidal or Homicidal, Awake Alert, Oriented X 3.  Pleasant  3. No F.N deficits, ALL C.Nerves Intact, Strength 5/5 all 4 extremities, Sensation intact all 4 extremities, Plantars down going.  4. Ears and Eyes appear Normal, Conjunctivae clear, PERRLA. Moist Oral Mucosa.  5. Supple Neck, No JVD, No cervical lymphadenopathy appriciated, No Carotid Bruits.  6. Symmetrical Chest wall movement, Good air movement bilaterally, CTAB.  7. RRR, No Gallops, Rubs or Murmurs, No Parasternal Heave.  Has right lower extremity edema.  8. Positive Bowel Sounds, Abdomen Soft, No tenderness, No organomegaly appriciated,No rebound -guarding or rigidity.  9.  No Cyanosis, Normal Skin Turgor, No Skin Rash or Bruise.  10. Good muscle tone,  joints appear normal , no effusions, Normal ROM.    Data Review:    CBC Recent Labs  Lab 09/03/24 1247 09/03/24 1416  WBC 6.9  --   HGB 5.8* 7.1*  HCT 21.2* 21.0*  PLT 327  --    MCV 65.4*  --   MCH 17.9*  --   MCHC 27.4*  --   RDW 24.1*  --    ------------------------------------------------------------------------------------------------------------------  Chemistries  Recent Labs  Lab 09/03/24 1247 09/03/24 1416  NA 134* 136  K 3.4* 3.5  CL 102 102  CO2 20*  --   GLUCOSE 85 84  BUN 23 22  CREATININE 1.53* 1.80*  CALCIUM  9.0  --    ------------------------------------------------------------------------------------------------------------------ estimated creatinine clearance is 30.2 mL/min (A) (by C-G formula based on SCr of 1.8 mg/dL (H)). ------------------------------------------------------------------------------------------------------------------ No results for input(s): TSH, T4TOTAL, T3FREE, THYROIDAB in the last 72 hours.  Invalid input(s): FREET3  Coagulation profile No results for input(s): INR, PROTIME in the last 168 hours. ------------------------------------------------------------------------------------------------------------------- No results for input(s): DDIMER in the last 72 hours. -------------------------------------------------------------------------------------------------------------------  Cardiac Enzymes No results for input(s): CKMB, TROPONINI, MYOGLOBIN in the last 168 hours.  Invalid input(s): CK ------------------------------------------------------------------------------------------------------------------    Component Value Date/Time   BNP 1,333.7 (H) 06/26/2024 0043     ---------------------------------------------------------------------------------------------------------------  Urinalysis    Component Value Date/Time   COLORURINE STRAW (A) 02/03/2024 1536   APPEARANCEUR CLEAR 02/03/2024 1536   LABSPEC 1.008 02/03/2024 1536   PHURINE 6.0 02/03/2024 1536   GLUCOSEU NEGATIVE 02/03/2024 1536   HGBUR NEGATIVE 02/03/2024 1536   BILIRUBINUR NEGATIVE 02/03/2024 1536    KETONESUR NEGATIVE 02/03/2024 1536   PROTEINUR NEGATIVE 02/03/2024 1536   UROBILINOGEN 1.0 06/14/2010 2004   NITRITE NEGATIVE 02/03/2024 1536   LEUKOCYTESUR NEGATIVE 02/03/2024 1536    ----------------------------------------------------------------------------------------------------------------   Imaging Results:    DG Hip Unilat W or Wo Pelvis 2-3 Views Left Result Date: 09/03/2024 CLINICAL DATA:  Status post fall. EXAM: DG HIP (WITH OR WITHOUT PELVIS) 2-3V LEFT COMPARISON:  None Available. FINDINGS: There is no evidence of an acute hip fracture or dislocation. Mild degenerative changes are seen involving both hips in the form of joint space narrowing and acetabular sclerosis. Marked severity vascular calcification is seen. IMPRESSION: Mild degenerative changes without evidence of an acute fracture or dislocation. Electronically Signed   By: Suzen Dials M.D.   On: 09/03/2024 16:11   CT Head Wo Contrast Result Date: 09/03/2024 CLINICAL DATA:  New onset seizure EXAM: CT HEAD WITHOUT CONTRAST TECHNIQUE: Contiguous axial images were obtained from the base of the skull through the vertex without intravenous contrast. RADIATION DOSE REDUCTION: This exam was performed according to the departmental dose-optimization program which includes automated exposure control, adjustment of the mA and/or kV according to patient size and/or use of iterative reconstruction technique. COMPARISON:  01/06/2012 FINDINGS: Brain: No acute infarct or hemorrhage. Lateral ventricles and midline structures are unremarkable. No acute extra-axial fluid collections. No mass effect. Vascular: No hyperdense vessel or unexpected calcification. Skull: Normal. Negative for fracture or focal lesion. Sinuses/Orbits: Mild mucosal thickening and retained secretions within the right sphenoid sinus. 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 Other: None. IMPRESSION: 1. No acute intracranial process.  2. Minimal sphenoid sinus disease as above. Electronically Signed   By: Ozell Daring M.D.   On: 09/03/2024 16:04     EKG:  Vent. rate 63 BPM PR interval 205 ms QRS duration 122 ms QT/QTcB 413/423 ms P-R-T axes 0 6 43 Sinus rhythm LAE, consider biatrial enlargement Nonspecific intraventricular conduction delay Anteroseptal infarct, age indeterminate Artifact in lead(s) I II aVR aVL aVF V1 V2 V6 No significant change since last tracing //artifact, Confirmed by Dreama Longs ([id number]) on 09/03/2024 4:23:14 PM   Assessment & Plan:    Principal Problem:   Symptomatic anemia Active Problems:   Essential hypertension   Tobacco abuse   CKD (chronic kidney disease), stage IIIa (HCC)  Hyperlipidemia    Fall, near syncope? -Patient poor historian, but he does report fall, some dizziness, likely near syncope in the setting of symptomatic anemia. - Blood pressure soft in ED, hold all antihypertensive medication. - Will check orthostasis -monitor on telemetry- CT head with no acute findings  -Will obtain EEG given some concern of jerking movements to rule out seizures .  Symptomatic anemia -Hemoglobin 5.8 on admission, was 7.3 on discharge in July. - He is Hemoccult negative, no evidence of any GI bleed, reports stool light in color, denies any melena, bright red blood per rectum or coffee-ground emesis. - Denies any NSAIDs use. - This is most likely anemia in the setting of iron  deficiency and borderline B12 level -Transfused 2 units PRBC. - Now we will continue clear liquid diet, and on IV Protonix  40 mg IV twice daily  Of note recent hospital stay significant for EGD 7/5: gastritis with signs of recent bleeding and gastric/small bowel AVMs that were treated with APC Colonoscopy 7/5: Unremarkable Candidal esophagitis: Treated with Diflucan   CKD stage IIIb and none anion gap metabolic acidosis -Avoid nephrotoxic medications -Continue at baseline  Hyponatremia -mild, repeat  BMP in a.m.  Hypokalemia -replaced  chronic heart failure with mildly reduced ejection fraction, POA: -Echocardiogram done in March 2025 showed EF of 45 to 50%. - Euvolemic  Hypertension -Hold home regimen due to soft blood pressure on presentation.  Right lower extremity edema  -Will check venous Doppler   Vitamin D  deficiency  -continue with supplements       DVT Prophylaxis  SCDs   AM Labs Ordered, also please review Full Orders  Family Communication: Admission, patients condition and plan of care including tests being ordered have been discussed with the patient who indicate understanding and agree with the plan and Code Status.  Code Status full code  Likely DC to home Consults called: None  Admission status: 70 minutes  Time spent in minutes : Inpatient   Brayton Lye M.D on 09/03/2024 at 5:23 PM   [facility] - Office  [phone]

## 2024-09-03 NOTE — Progress Notes (Signed)
 [facility] night cross cover note:  Pt c/o some cramping discomfort associated with the left thigh.  I subsequently placed orders for as needed Norco for pain as well as as needed p.o. Robaxin  for muscle spasm/cramps.     Eva Pore, DO Hospitalist

## 2024-09-03 NOTE — ED Provider Notes (Signed)
 Received patient in signout from previous provider pending lab, imaging completion, reassessment, dispo.  See previous provider note.  In short, patient presents to Emergency Department via EMS for evaluation of fall out of bed while watching TV day-to-day.  Reports that he fell to the ground but did not lose consciousness but was having jerking motions. He is able to recall entire event. No history of seizures  Was provided Tylenol  by EMS for feeling hot.  Vital signs WNL with no fever, tachycardia, nor hypotension.  Hemodynamically stable  ED workup notable for Hgb of 5.8.  Baseline has been 7.3-8.8 over past 1 year.  Was admitted on 06/25/2024 for symptomatic anemia requiring 1 PRBC.  Occult negative. EGD was performed on 06/29/2024 noting AVM (treated with APC), gastritis, Candida esophagitis. Was placed on oral antifungal for candida.  CT head negative for ICH.  Right hip x-ray negative for injury.  No other injuries reported by patient nor found on physical exam.  .Critical Care  Performed by: Minnie Tinnie BRAVO, PA Authorized by: Minnie Tinnie BRAVO, PA   Critical care provider statement:    Critical care time (minutes):  32   Critical care was necessary to treat or prevent imminent or life-threatening deterioration of the following conditions:  Circulatory failure   Critical care was time spent personally by me on the following activities:  Development of treatment plan with patient or surrogate, discussions with consultants, evaluation of patient's response to treatment, examination of patient, ordering and review of laboratory studies, ordering and review of radiographic studies, ordering and performing treatments and interventions, pulse oximetry, re-evaluation of patient's condition and review of old charts   Care discussed with: admitting provider     Physical Exam Exam conducted with a chaperone present.  Constitutional:      Appearance: Normal appearance.  Pulmonary:     Breath  sounds: Normal breath sounds.  Abdominal:     General: Bowel sounds are normal. There is no distension.     Palpations: Abdomen is soft.     Tenderness: There is no abdominal tenderness. There is no guarding or rebound.  Genitourinary:    Rectum: Guaiac result negative.     Comments: No external hemorrhoids nor gross hemorrhage. Stool obtained on DRE was brown.  Skin:    Comments: No ecchymosis to chest, abdomen, back, nor retroperitoneum.  Neurological:     Mental Status: He is alert and oriented to person, place, and time.    Melissa Trader IT consultant GU exam  Fecal occult negative.  Suspect that patient may have more AVMs, gastritis that is causing anemia as he did 2 months ago.  Will provide 2 PRBC here in Emergency Department and admit for symptomatic anemia. Have not transfused yet pending T&S.  Consulted hospitalist Dr. Sherlon and discussed labs, disposition.  MD accepts patient for admission   Minnie Tinnie BRAVO, [place] 09/03/24 1721    Dreama Longs, MD 09/04/24 [phone]

## 2024-09-03 NOTE — ED Provider Notes (Signed)
 [place] EMERGENCY DEPARTMENT AT [facility] Provider Note   CSN: [id number] Arrival date & time: 09/03/24  1230     Patient presents with: Martin Mason is a 68 y.o. male.   HPI  Patient is a 68 year old gentleman with a past medical history significant for hypertension, asthma, peripheral vascular disease  Also with history of alcoholism  He presents emergency room today with complaints of syncopal episode versus seizure.  Seems that he was watching TV today and fell out of his bed he states he jerked around to the ground but did not lose consciousness.  He denies any slurring of his speech confusion or limb weakness.  He endorses some generalized bodyaches.  Denies any nausea or vomiting no chest pain or headache.  No other associate symptoms.  Denies any urinary frequency urgency dysuria or hematuria.  Denies any cough or shortness of breath or chest pain.     Prior to Admission medications   Medication Sig Start Date End Date Taking? Authorizing Provider  amLODipine  (NORVASC ) 10 MG tablet Take 1 tablet by mouth once daily 08/19/24   Newlin, Enobong, MD  atorvastatin  (LIPITOR) 20 MG tablet Take 1 tablet (20 mg total) by mouth daily. 01/17/24   Newlin, Enobong, MD  cetirizine  (ZYRTEC ) 10 MG tablet Take 1 tablet (10 mg total) by mouth daily. 03/13/24   Newlin, Enobong, MD  cholecalciferol  (VITAMIN D3) 10 MCG (400 UNIT) TABS tablet Take 1 tablet (400 Units total) by mouth daily. 07/01/24   Dino Antu, MD  ferrous sulfate  325 (65 FE) MG tablet Take 1 tablet (325 mg total) by mouth 2 (two) times daily with a meal. 07/23/24   Delbert Clam, MD  fluticasone  (FLONASE ) 50 MCG/ACT nasal spray Use 2 spray(s) in each nostril once daily 07/01/24   Newlin, Enobong, MD  furosemide  (LASIX ) 20 MG tablet Take 1 tablet (20 mg total) by mouth daily. 07/01/24   Newlin, Enobong, MD  lisinopril -hydrochlorothiazide  (ZESTORETIC ) 20-25 MG tablet Take 1 tablet by mouth daily. 04/17/24    [provider]  pantoprazole  (PROTONIX ) 40 MG tablet Take 1 tablet (40 mg total) by mouth 2 (two) times daily before a meal. Protonix  BID x 10 weeks and then once daily 06/30/24   Rashid, Farhan, MD  potassium chloride  SA (KLOR-CON  M) 20 MEQ tablet Take 1 tablet (20 mEq total) by mouth daily. 08/02/24   Newlin, Enobong, MD  sildenafil  (VIAGRA ) 50 MG tablet TAKE 1 TABLET BY MOUTH ONCE DAILY AS NEEDED FOR ERECTILE DYSFUNCTION AT  LEAST  24  HOURS  BETWEEN  DOSES 08/17/23   Newlin, Enobong, MD    Allergies: Patient has no known allergies.    Review of Systems  Updated Vital Signs BP (!) 112/54 (BP Location: Right Arm)   Pulse 70   Temp 98.4 F (36.9 C) (Oral)   Resp 18   Ht 5' 8 (1.727 m)   Wt 55.2 kg   SpO2 98%   BMI 18.50 kg/m   Physical Exam Vitals and nursing note reviewed.  Constitutional:      General: He is not in acute distress. HENT:     Head: Normocephalic and atraumatic.     Nose: Nose normal.  Eyes:     General: No scleral icterus. Cardiovascular:     Rate and Rhythm: Normal rate and regular rhythm.     Pulses: Normal pulses.     Heart sounds: Normal heart sounds.  Pulmonary:     Effort: Pulmonary effort is  normal. No respiratory distress.     Breath sounds: No wheezing.  Abdominal:     Palpations: Abdomen is soft.     Tenderness: There is no abdominal tenderness.  Musculoskeletal:     Cervical back: Normal range of motion.     Right lower leg: No edema.     Left lower leg: No edema.  Skin:    General: Skin is warm and dry.     Capillary Refill: Capillary refill takes less than 2 seconds.  Neurological:     Mental Status: He is alert. Mental status is at baseline.     Comments: Alert and oriented to self, place, time and event.   Speech is fluent, clear without dysarthria or dysphasia.   Strength 5/5 in upper/lower extremities   Sensation intact in upper/lower extremities   CN I not tested  CN II grossly intact visual fields bilaterally. Did  not visualize posterior eye.  CN III, IV, VI PERRLA and EOMs intact bilaterally  CN V Intact sensation to sharp and light touch to the face  CN VII facial movements symmetric  CN VIII not tested  CN IX, X no uvula deviation, symmetric rise of soft palate  CN XI 5/5 SCM and trapezius strength bilaterally  CN XII Midline tongue protrusion, symmetric L/R movements     Psychiatric:        Mood and Affect: Mood normal.        Behavior: Behavior normal.     (all labs ordered are listed, but only abnormal results are displayed) Labs Reviewed  CBC - Abnormal; Notable for the following components:      Result Value   RBC 3.24 (*)    Hemoglobin 5.8 (*)    HCT 21.2 (*)    MCV 65.4 (*)    MCH 17.9 (*)    MCHC 27.4 (*)    RDW 24.1 (*)    nRBC 0.3 (*)    All other components within normal limits  I-STAT CHEM 8, ED - Abnormal; Notable for the following components:   Creatinine, Ser 1.80 (*)    Hemoglobin 7.1 (*)    HCT 21.0 (*)    All other components within normal limits  BASIC METABOLIC PANEL WITH GFR  POC OCCULT BLOOD, ED  TYPE AND SCREEN    EKG: None  Radiology: No results found.   SABRAUltrasound ED Peripheral IV (Provider)  Date/Time: 09/03/2024 2:22 PM  Performed by: Neldon Hamp RAMAN, PA Authorized by: Neldon Hamp RAMAN, PA   Procedure details:    Indications: multiple failed IV attempts     Skin Prep: chlorhexidine  gluconate     Location:  Right AC   Angiocath:  20 G   Bedside Ultrasound Guided: Yes     Images: archived     Patient tolerated procedure without complications: Yes     Dressing applied: Yes      Medications Ordered in the ED - No data to display                                  Medical Decision Making Amount and/or Complexity of Data Reviewed Labs: ordered. Radiology: ordered.   This patient presents to the ED for concern of syncope, this involves a number of treatment options, and is a complaint that carries with it a moderate to high risk of  complications and morbidity. A differential diagnosis was considered for the patient's symptoms which  is discussed below:   The differential diagnosis for syncope includes (but is not limited to) anemia, hypoglycemia, arrhythmia, seizure.  If associated with pain loss of consciousness can certainly also include subarachnoid hemorrhage, thoracic aortic dissection, PE, ectopic pregnancy, AAA.  Also with considering is a vasovagal reaction, hypovolemia/dehydration.    Co morbidities: Discussed in HPI   Brief History:  Patient is a 68 year old gentleman with a past medical history significant for hypertension, asthma, peripheral vascular disease  Also with history of alcoholism  He presents emergency room today with complaints of syncopal episode versus seizure.  Seems that he was watching TV today and fell out of his bed he states he jerked around to the ground but did not lose consciousness.  He denies any slurring of his speech confusion or limb weakness.  He endorses some generalized bodyaches.  Denies any nausea or vomiting no chest pain or headache.  No other associate symptoms.  Denies any urinary frequency urgency dysuria or hematuria.  Denies any cough or shortness of breath or chest pain.    EMR reviewed including pt PMHx, past surgical history and past visits to ER.   See HPI for more details   Lab Tests:  Critical low hgb of 5.8    Imaging Studies:  Patient care handed off pending CT head and left hip x-ray    Cardiac Monitoring:  The patient was maintained on a cardiac monitor.  I personally viewed and interpreted the cardiac monitored which showed an underlying rhythm of: NSR  EKG pending   Medicines ordered:    Critical Interventions:   identification and treatment of severe anemia   Consults/Attending Physician      Reevaluation:  After the interventions noted above I re-evaluated patient and found that they have :stayed the same   Social  Determinants of Health:      Problem List / ED Course:  Severe anemia. Hx of similar. ?syncopal episode. Workup pending. Handoff to PM team for follow up on imaging and labs.    Dispostion:  Pending completion of workup   Final diagnoses:  Fall, initial encounter  Symptomatic anemia    ED Discharge Orders     None          Neldon Hamp RAMAN, [place] 09/03/24 1540    Elnor Jayson LABOR, DO 09/05/24 [phone]

## 2024-09-04 ENCOUNTER — Inpatient Hospital Stay (HOSPITAL_COMMUNITY): Payer: Medicare (Managed Care)

## 2024-09-04 DIAGNOSIS — N1831 Chronic kidney disease, stage 3a: Secondary | ICD-10-CM | POA: Diagnosis not present

## 2024-09-04 DIAGNOSIS — R569 Unspecified convulsions: Secondary | ICD-10-CM | POA: Diagnosis not present

## 2024-09-04 DIAGNOSIS — I10 Essential (primary) hypertension: Secondary | ICD-10-CM | POA: Diagnosis not present

## 2024-09-04 DIAGNOSIS — R609 Edema, unspecified: Secondary | ICD-10-CM

## 2024-09-04 DIAGNOSIS — R55 Syncope and collapse: Secondary | ICD-10-CM

## 2024-09-04 DIAGNOSIS — D649 Anemia, unspecified: Secondary | ICD-10-CM | POA: Diagnosis not present

## 2024-09-04 LAB — COMPREHENSIVE METABOLIC PANEL WITH GFR
ALT: 20 U/L (ref 0–44)
AST: 29 U/L (ref 15–41)
Albumin: 3.4 g/dL — ABNORMAL LOW (ref 3.5–5.0)
Alkaline Phosphatase: 70 U/L (ref 38–126)
Anion gap: 12 (ref 5–15)
BUN: 14 mg/dL (ref 8–23)
CO2: 21 mmol/L — ABNORMAL LOW (ref 22–32)
Calcium: 8.9 mg/dL (ref 8.9–10.3)
Chloride: 98 mmol/L (ref 98–111)
Creatinine, Ser: 1.33 mg/dL — ABNORMAL HIGH (ref 0.61–1.24)
GFR, Estimated: 58 mL/min — ABNORMAL LOW (ref >60)
Glucose, Bld: 99 mg/dL (ref 70–99)
Potassium: 3 mmol/L — ABNORMAL LOW (ref 3.5–5.1)
Sodium: 131 mmol/L — ABNORMAL LOW (ref 135–145)
Total Bilirubin: 1.4 mg/dL — ABNORMAL HIGH (ref 0.0–1.2)
Total Protein: 7 g/dL (ref 6.5–8.1)

## 2024-09-04 LAB — BPAM RBC
Blood Product Expiration Date: 202510082359
Blood Product Expiration Date: 202510082359
ISSUE DATE / TIME: 202509091930
ISSUE DATE / TIME: 202509092309
Unit Type and Rh: 5100
Unit Type and Rh: 5100

## 2024-09-04 LAB — TYPE AND SCREEN
ABO/RH(D): O POS
Antibody Screen: POSITIVE
DAT, IgG: NEGATIVE
Unit division: 0
Unit division: 0
Unit division: 0
Unit division: 0

## 2024-09-04 LAB — CBC
HCT: 33.5 % — ABNORMAL LOW (ref 39.0–52.0)
Hemoglobin: 10.1 g/dL — ABNORMAL LOW (ref 13.0–17.0)
MCH: 20.5 pg — ABNORMAL LOW (ref 26.0–34.0)
MCHC: 30.1 g/dL (ref 30.0–36.0)
MCV: 68.1 fL — ABNORMAL LOW (ref 80.0–100.0)
Platelets: 337 K/uL (ref 150–400)
RBC: 4.92 MIL/uL (ref 4.22–5.81)
RDW: 26.6 % — ABNORMAL HIGH (ref 11.5–15.5)
WBC: 10.4 K/uL (ref 4.0–10.5)
nRBC: 0.2 % (ref 0.0–0.2)

## 2024-09-04 MED ORDER — SODIUM CHLORIDE 0.9 % IV SOLN: 25.0000 mg | Freq: Once | INTRAVENOUS | Status: DC

## 2024-09-04 MED ORDER — SODIUM CHLORIDE 0.9 % IV SOLN: 500.0000 mg | Freq: Once | INTRAVENOUS | Status: DC

## 2024-09-04 MED ORDER — IRON SUCROSE 500 MG IVPB - SIMPLE MED
500.0000 mg | Freq: Once | INTRAVENOUS | Status: DC
  Filled 2024-09-04: qty 275

## 2024-09-04 MED ORDER — SODIUM CHLORIDE 0.9 % IV SOLN
500.0000 mg | Freq: Once | INTRAVENOUS | Status: AC
  Administered 2024-09-04: 500 mg via INTRAVENOUS
  Filled 2024-09-04: qty 25

## 2024-09-04 NOTE — Progress Notes (Signed)
 [facility] PROGRESS NOTE    Progress Note  Martin Mason  FMW:[id number] DOB: [birth year] DOA: 09/03/2024 PCP: Delbert Clam, MD     Brief Narrative:   Martin Mason is an 68 y.o. male past medical history significant for essential hypertension, hyperlipidemia PAD HFrEF with a last EF of 45% on March 2025 with grade 1 diastolic dysfunction, alcohol abuse, iron  deficiency anemia patient was recently discharged in July 2025 with an EGD that showed mild gastritis with gastric and small bowels AVMs Candida esophagitis, colonoscopy was unremarkable comes into the ED for falls denies any loss of consciousness, in the ED was found to have a potassium of 3.4 and hemoglobin of 5.8 Hemoccult was negative  Assessment/Plan:   Fall likely due to symptomatic anemia/iron  deficiency anemia: Hemoglobin 5.8 on admission, Hemoccult negative in the ED. Denies any NSAID. Is being transfused 2 units of packed red blood cells. Antihypertensive medications are held, has not no events on telemetry, CT scan of the head showed no acute findings. Started on Protonix  IV twice daily. Continue to clear liquid diet. FOBT stools, continue Protonix  twice a day. Ferritin is 8 will give IV iron  and start on orals iron  as an outpatient. Hemoglobin is the morning of 7.1. EEG is pending.  Chronic kidney disease stage IIIb: Creatinine at baseline.  Hyponatremia: Now resolved.  Hypokalemia: Repleted orally now improved.  HFrEF: Appears euvolemic.  Essential hypertension Continue to hold antihypertensive medications blood pressure stable.  Right lower extremity edema: Lower extremity Dopplers pending.  Vitamin D  deficiency: Noted.   DVT prophylaxis: SCD Family Communication:none Status is: Inpatient Remains inpatient appropriate because: Near syncope    Code Status:     Code Status Orders  (From admission, onward)           Start     Ordered   09/03/24 1722  Full code  Continuous        Question:  By:  Answer:  Consent: discussion documented in EHR   09/03/24 1722           Code Status History     Date Active Date Inactive Code Status Order ID Comments User Context   06/26/2024 0146 06/30/2024 1620 Full Code [id number]  Charlton Evalene RAMAN, MD ED   02/03/2024 1614 02/06/2024 1602 Full Code [id number]  Lou Claretta HERO, MD ED         IV Access:   Peripheral IV   Procedures and diagnostic studies:   DG Hip Unilat W or Wo Pelvis 2-3 Views Left Result Date: 09/03/2024 CLINICAL DATA:  Status post fall. EXAM: DG HIP (WITH OR WITHOUT PELVIS) 2-3V LEFT COMPARISON:  None Available. FINDINGS: There is no evidence of an acute hip fracture or dislocation. Mild degenerative changes are seen involving both hips in the form of joint space narrowing and acetabular sclerosis. Marked severity vascular calcification is seen. IMPRESSION: Mild degenerative changes without evidence of an acute fracture or dislocation. Electronically Signed   By: Suzen Dials M.D.   On: 09/03/2024 16:11   CT Head Wo Contrast Result Date: 09/03/2024 CLINICAL DATA:  New onset seizure EXAM: CT HEAD WITHOUT CONTRAST TECHNIQUE: Contiguous axial images were obtained from the base of the skull through the vertex without intravenous contrast. RADIATION DOSE REDUCTION: This exam was performed according to the departmental dose-optimization program which includes automated exposure control, adjustment of the mA and/or kV according to patient size and/or use of iterative reconstruction technique. COMPARISON:  01/06/2012 FINDINGS: Brain: No acute infarct or hemorrhage. Lateral ventricles  and midline structures are unremarkable. No acute extra-axial fluid collections. No mass effect. Vascular: No hyperdense vessel or unexpected calcification. Skull: Normal. Negative for fracture or focal lesion. Sinuses/Orbits: Mild mucosal thickening and retained secretions within the right sphenoid sinus. 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 Other: None. IMPRESSION: 1. No acute intracranial process. 2. Minimal sphenoid sinus disease as above. Electronically Signed   By: Ozell Daring M.D.   On: 09/03/2024 16:04     Medical Consultants:   None.   Subjective:    Martin Mason denies any symptoms, during his episode he denies any loss of consciousness loss of sphincter control.  Objective:    Vitals:   09/04/24 0400 09/04/24 0430 09/04/24 0500 09/04/24 0515  BP: 122/83 128/72 118/67 (!) 125/94  Pulse: 83 83 60 80  Resp: (!) 22 19 16 16   Temp:      TempSrc:      SpO2: 100% 99% 100% 100%  Weight:      Height:       SpO2: 100 %   Intake/Output Summary (Last 24 hours) at 09/04/2024 0620 Last data filed at 09/03/2024 2327 Gross per 24 hour  Intake 315 ml  Output 1550 ml  Net -1235 ml   Filed Weights   09/03/24 1245  Weight: 55.2 kg    Exam: General exam: In no acute distress. Respiratory system: Good air movement and clear to auscultation. Cardiovascular system: S1 & S2 heard, RRR. No JVD. Gastrointestinal system: Abdomen is nondistended, soft and nontender.  Extremities: No pedal edema. Skin: No rashes, lesions or ulcers Psychiatry: Judgement and insight appear normal. Mood & affect appropriate.    Data Reviewed:    Labs: Basic Metabolic Panel: Recent Labs  Lab 09/03/24 1247 09/03/24 1416  NA 134* 136  K 3.4* 3.5  CL 102 102  CO2 20*  --   GLUCOSE 85 84  BUN 23 22  CREATININE 1.53* 1.80*  CALCIUM  9.0  --    GFR Estimated Creatinine Clearance: 30.2 mL/min (A) (by C-G formula based on SCr of 1.8 mg/dL (H)). Liver Function Tests: No results for input(s): AST, ALT, ALKPHOS, BILITOT, PROT, ALBUMIN  in the last 168 hours. No results for input(s): LIPASE, AMYLASE in the last 168 hours. No results for input(s): AMMONIA in the last 168 hours. Coagulation profile No results for input(s): INR, PROTIME in the last 168  hours. COVID-19 Labs  Recent Labs    09/03/24 1717  FERRITIN 8*    Lab Results  Component Value Date   SARSCOV2NAA NEGATIVE 02/03/2024   SARSCOV2NAA NEGATIVE 11/06/2020   SARSCOV2NAA Not Detected 08/17/2019    CBC: Recent Labs  Lab 09/03/24 1247 09/03/24 1416  WBC 6.9  --   HGB 5.8* 7.1*  HCT 21.2* 21.0*  MCV 65.4*  --   PLT 327  --    Cardiac Enzymes: No results for input(s): CKTOTAL, CKMB, CKMBINDEX, TROPONINI in the last 168 hours. BNP (last 3 results) Recent Labs    01/17/24 1447  PROBNP 2,597*   CBG: No results for input(s): GLUCAP in the last 168 hours. D-Dimer: No results for input(s): DDIMER in the last 72 hours. Hgb A1c: No results for input(s): HGBA1C in the last 72 hours. Lipid Profile: No results for input(s): CHOL, HDL, LDLCALC, TRIG, CHOLHDL, LDLDIRECT in the last 72 hours. Thyroid function studies: No results for input(s): TSH, T4TOTAL, T3FREE, THYROIDAB in the last 72 hours.  Invalid input(s): FREET3 Anemia work up: [employer]  09/03/24 1717  VITAMINB12 420  FOLATE 16.9  FERRITIN 8*  TIBC 357  IRON  12*  RETICCTPCT 1.7   Sepsis Labs: Recent Labs  Lab 09/03/24 1247  WBC 6.9   Microbiology No results found for this or any previous visit (from the past 240 hours).   Medications:    atorvastatin   20 mg Oral Daily   cholecalciferol   400 Units Oral Daily   cyanocobalamin   1,000 mcg Subcutaneous Daily   fluticasone   1 spray Each Nare Daily   nicotine   21 mg Transdermal Daily   pantoprazole  (PROTONIX ) IV  40 mg Intravenous Q12H   potassium chloride  SA  20 mEq Oral Daily   Continuous Infusions:    LOS: 1 day   Erle Odell Castor  [facility]  09/04/2024, 6:20 AM

## 2024-09-04 NOTE — Evaluation (Addendum)
 Physical Therapy Evaluation Patient Details Name: Martin Mason MRN: [id number] DOB: [birth year] Today's Date: 09/04/2024  History of Present Illness  Pt is a 68 y.o. M who presents 9/9 with complaints of syncopal episode versus seizure. ED his workup significant for potassium 3.4, sodium 134, CO2 of 20, creatinine at baseline 1.53, CT head with no acute findings, he was noted to have hemoglobin of 5.8. Recent hospitalization July 2025 significant for anemia. PMH: hypertension, hyperlipidemia, PAD, chronic HFmrEF, iron  deficiency anemia, and alcohol abuse in remission.   Clinical Impression  Pt presents with condition above and deficits mentioned below, see PT Problem List. PTA, he was independent without DME, living with his x2 brothers in a 1-level house with 3 STE. Currently, the pt displays deficits in memory, denying any recent falls yet chart review and his brother report otherwise. His brother, Martin Mason, states pt has fallen multiple times and is unable to get back up on his own. The pt's brother, Martin Mason, works and cannot be available 24/7 and his other brother is sick with cancer and is limited in the amount of help he can provide. The pt is also displaying deficits in balance, endurance, power, and strength that place him at risk for further falls. He was able to perform all functional mobility slowly, relying on a RW for support, with CGA for safety this session. Yet, earlier he needed min-modA for bed mobility and transfers with OT. As his presentation has been inconsistent, he is at high risk for further falls, and has limited physical support available to him at d/c, he could benefit from short-term inpatient rehab, < 3 hours/day. Will continue to follow acutely and assess d/c needs as he progresses. Educated brother, Martin Mason, of pt's current need for 24/7 care to assist with ADLs, cognitive tasks, and mobility with RW if pt d/c's home instead.   BP -  108/64 supine 93/66 sitting 94/59  standing 120/59 supine after ambulating end of session        If plan is discharge home, recommend the following: A little help with walking and/or transfers;A little help with bathing/dressing/bathroom;Assistance with cooking/housework;Direct supervision/assist for medications management;Direct supervision/assist for financial management;Assist for transportation;Help with stairs or ramp for entrance;Supervision due to cognitive status   Can travel by private vehicle   Yes    Equipment Recommendations Rolling walker (2 wheels);BSC/3in1 (if there is not one already available to him at d/c)  Recommendations for Other Services       Functional Status Assessment Patient has had a recent decline in their functional status and demonstrates the ability to make significant improvements in function in a reasonable and predictable amount of time.     Precautions / Restrictions Precautions Precautions: Fall;Other (comment) Precaution/Restrictions Comments: watch BP Restrictions Weight Bearing Restrictions Per Provider Order: No      Mobility  Bed Mobility Overal bed mobility: Needs Assistance Bed Mobility: Supine to Sit, Sit to Supine     Supine to sit: Contact guard, HOB elevated Sit to supine: Contact guard assist, HOB elevated   General bed mobility comments: Extra time for bed mobility, specifically to push up on arms on bed to ascend trunk to sit up R EOB, CGA for safety    Transfers Overall transfer level: Needs assistance Equipment used: Rolling walker (2 wheels), None Transfers: Sit to/from Stand Sit to Stand: Contact guard assist           General transfer comment: Pt stood from EOB 2x, 1x to no AD and 1x to RW,  no LOB but mild unsteadiness noted, CGA for safety. Pt pushed up on RW with bil UEs to stand up.    Ambulation/Gait Ambulation/Gait assistance: Contact guard assist Gait Distance (Feet): 60 Feet Assistive device: Rolling walker (2 wheels) Gait  Pattern/deviations: Step-through pattern, Decreased step length - right, Decreased step length - left, Decreased stride length, Decreased dorsiflexion - right, Decreased dorsiflexion - left, Shuffle, Trunk flexed, Narrow base of support Gait velocity: reduced Gait velocity interpretation: <1.31 ft/sec, indicative of household ambulator   General Gait Details: Pt takes slow, small, shuffling steps, needing VCs to widen stance and clear his feet. Pt also needed cues to remain more proximal to the RW and keep the RW on the ground. No LOB, CGA for safety. Pt very unsteady when initially attempting to ambulate without RW, holding onto the bed instead.  Stairs            Wheelchair Mobility     Tilt Bed    Modified Rankin (Stroke Patients Only)       Balance Overall balance assessment: Needs assistance Sitting-balance support: Feet supported Sitting balance-Leahy Scale: Fair     Standing balance support: During functional activity, Bilateral upper extremity supported Standing balance-Leahy Scale: Poor Standing balance comment: reliant on UE support                             Pertinent Vitals/Pain Pain Assessment Pain Assessment: Faces Faces Pain Scale: Hurts little more Pain Location: legs Pain Descriptors / Indicators: Discomfort, Grimacing, Guarding, Cramping Pain Intervention(s): Monitored during session, Limited activity within patient's tolerance, Repositioned    Home Living Family/patient expects to be discharged to:: Private residence Living Arrangements: Other relatives (2 brothers) Available Help at Discharge: Family;Available 24 hours/day Type of Home: House Home Access: Stairs to enter Entrance Stairs-Rails: Right Entrance Stairs-Number of Steps: 3   Home Layout: One level Home Equipment: Cane - single point;Shower Counsellor (2 wheels);Wheelchair - manual;BSC/3in1 Additional Comments: Brother, Martin Mason, works and cannot be there 24/7, but  other brother can be but is sick and having difficulty mobilizing himself    Prior Function Prior Level of Function : Independent/Modified Independent;History of Falls (last six months)             Mobility Comments: no AD, denies falls (despite fall reported in chart review and multiple falls reported by brother, in which pt is unable to get up his own) ADLs Comments: indep ADLs, completes some IADLs, manages his own medications and his brothers. does not drive     Extremity/Trunk Assessment   Upper Extremity Assessment Upper Extremity Assessment: Defer to OT evaluation    Lower Extremity Assessment Lower Extremity Assessment: Generalized weakness (denied numbness/tingling)    Cervical / Trunk Assessment Cervical / Trunk Assessment: Kyphotic  Communication   Communication Communication: Impaired Factors Affecting Communication: Hearing impaired    Cognition Arousal: Alert Behavior During Therapy: WFL for tasks assessed/performed, Flat affect   PT - Cognitive impairments: No family/caregiver present to determine baseline                       PT - Cognition Comments: Pt with noted memory deficits, denying any recent falls even though chart reporting a fall leading to this admission. Pt A&Ox4 though. Following commands: Intact       Cueing Cueing Techniques: Verbal cues     General Comments General comments (skin integrity, edema, etc.): BP - 108/64 supine, 93/66  sitting, 94/59 standing, 120/59 supine after ambulating end of session; educated pt to use his RW at home at all times and to use the shower chair/bedside commode in the shower for increased safety, he verbalized understanding    Exercises     Assessment/Plan    PT Assessment Patient needs continued PT services  PT Problem List Decreased strength;Decreased activity tolerance;Decreased balance;Decreased mobility;Decreased cognition;Decreased knowledge of use of DME;Decreased safety awareness        PT Treatment Interventions DME instruction;Gait training;Stair training;Therapeutic exercise;Functional mobility training;Therapeutic activities;Balance training;Neuromuscular re-education;Cognitive remediation;Patient/family education    PT Goals (Current goals can be found in the Care Plan section)  Acute Rehab PT Goals Patient Stated Goal: to improve and go home PT Goal Formulation: With patient/family Time For Goal Achievement: 09/18/24 Potential to Achieve Goals: Good    Frequency Min 3X/week     Co-evaluation               AM-PAC PT 6 Clicks Mobility  Outcome Measure Help needed turning from your back to your side while in a flat bed without using bedrails?: A Little Help needed moving from lying on your back to sitting on the side of a flat bed without using bedrails?: A Little Help needed moving to and from a bed to a chair (including a wheelchair)?: A Little Help needed standing up from a chair using your arms (e.g., wheelchair or bedside chair)?: A Little Help needed to walk in hospital room?: A Little Help needed climbing 3-5 steps with a railing? : A Lot 6 Click Score: 17    End of Session   Activity Tolerance: Patient tolerated treatment well Patient left: in bed;with call bell/phone within reach Nurse Communication: Other (comment) (BP) PT Visit Diagnosis: Unsteadiness on feet (R26.81);Other abnormalities of gait and mobility (R26.89);Muscle weakness (generalized) (M62.81);Difficulty in walking, not elsewhere classified (R26.2);History of falling (Z91.81)    Time: 8986-8955 PT Time Calculation (min) (ACUTE ONLY): 31 min   Charges:   PT Evaluation $PT Eval Moderate Complexity: 1 Mod PT Treatments $Therapeutic Activity: 8-22 mins PT General Charges $$ ACUTE PT VISIT: 1 Visit         Theo Ferretti, PT, DPT Acute Rehabilitation Services  Office: [phone]   Theo CHRISTELLA Ferretti 09/04/2024, 11:05 AM

## 2024-09-04 NOTE — ED Notes (Signed)
 PT at bedside.

## 2024-09-04 NOTE — Procedures (Signed)
 Patient Name: Saamir Armstrong  MRN: [id number]  Epilepsy Attending: Arlin MALVA Krebs  Referring Physician/Provider: Sherlon Brayton RAMAN, MD  Date: 09/03/2024 Duration: 36.58 mins  Patient history: 68yo M with syncope. EEG to evaluate for seizure  Level of alertness: Awake  AEDs during EEG study: None  Technical aspects: This EEG study was done with scalp electrodes positioned according to the 10-20 International system of electrode placement. Electrical activity was reviewed with band pass filter of 1-70Hz , sensitivity of 7 uV/mm, display speed of 35mm/sec with a 60Hz  notched filter applied as appropriate. EEG data were recorded continuously and digitally stored.  Video monitoring was available and reviewed as appropriate.  Description: The posterior dominant rhythm consists of 7Hz  activity of moderate voltage (25-35 uV) seen predominantly in posterior head regions, symmetric and reactive to eye opening and eye closing. Hyperventilation and photic stimulation were not performed.     ABNORMALITY - Background slow  IMPRESSION: This study is suggestive of mild diffuse encephalopathy. No seizures or epileptiform discharges were seen throughout the recording.  Sabria Florido O Zekiah Caruth

## 2024-09-04 NOTE — Evaluation (Signed)
 Occupational Therapy Evaluation Patient Details Name: Martin Mason MRN: [id number] DOB: 24-Jan-1956 Today's Date: 09/04/2024   History of Present Illness   Pt is a 68 y.o. M who presents 9/9 with complaints of syncopal episode versus seizure. ED his workup significant for potassium 3.4, sodium 134, CO2 of 20, creatinine at baseline 1.53, CT head with no acute findings, he was noted to have hemoglobin of 5.8. Recent hospitalization July 2025 significant for anemia. PMH: hypertension, hyperlipidemia, PAD, chronic HFmrEF, iron  deficiency anemia, and alcohol abuse in remission.     Clinical Impressions Martin Mason was evaluated s/p the above admission list. He lives with his 2 brothers and is indep for ADLs and mobility at baseline. Upon evaluation the pt was limited by weakness, BLE cramping pain, unsteady balance, visible shaking with activity and poor activity tolerance. Overall he needed min-mod A for all aspects of mobility and was limited to pivotal stepping due to weakness and BLR pain. Due to the deficits listed below the pt also needs up to mod A for LB ADLs and set up A for UB ADLs - pt had a continent BM on the [facility] and required max A for peri care. Pt will benefit from continued acute OT services and skilled inpatient follow up therapy, <3 hours/day.     If plan is discharge home, recommend the following:   A lot of help with walking and/or transfers;A lot of help with bathing/dressing/bathroom;Assistance with cooking/housework;Help with stairs or ramp for entrance;Assist for transportation     Functional Status Assessment   Patient has had a recent decline in their functional status and demonstrates the ability to make significant improvements in function in a reasonable and predictable amount of time.     Equipment Recommendations   None recommended by OT      Precautions/Restrictions   Precautions Precautions: Fall Restrictions Weight Bearing Restrictions Per Provider  Order: No     Mobility Bed Mobility Overal bed mobility: Needs Assistance Bed Mobility: Supine to Sit, Sit to Supine     Supine to sit: Min assist Sit to supine: Mod assist        Transfers Overall transfer level: Needs assistance Equipment used: 1 person hand held assist Transfers: Sit to/from Stand, Bed to chair/wheelchair/BSC Sit to Stand: Min assist     Step pivot transfers: Mod assist            Balance Overall balance assessment: Needs assistance Sitting-balance support: Feet supported Sitting balance-Leahy Scale: Fair     Standing balance support: During functional activity Standing balance-Leahy Scale: Poor                             ADL either performed or assessed with clinical judgement   ADL Overall ADL's : Needs assistance/impaired Eating/Feeding: Independent   Grooming: Set up;Sitting   Upper Body Bathing: Set up;Sitting   Lower Body Bathing: Moderate assistance;Sit to/from stand   Upper Body Dressing : Set up;Sitting   Lower Body Dressing: Moderate assistance;Sit to/from stand   Toilet Transfer: Minimal assistance;Stand-pivot;BSC/3in1   Toileting- Clothing Manipulation and Hygiene: Maximal assistance       Functional mobility during ADLs: Minimal assistance General ADL Comments: weak, visably shaking with activity, required cues for safety     Vision Baseline Vision/History: 1 Wears glasses Vision Assessment?: No apparent visual deficits     Perception Perception: Not tested       Praxis Praxis: Not tested  Pertinent Vitals/Pain Pain Assessment Pain Assessment: Faces Faces Pain Scale: Hurts even more Pain Location: BLE cramping Pain Descriptors / Indicators: Discomfort, Grimacing, Guarding Pain Intervention(s): Monitored during session, Limited activity within patient's tolerance     Extremity/Trunk Assessment Upper Extremity Assessment Upper Extremity Assessment: Generalized weakness   Lower  Extremity Assessment Lower Extremity Assessment: Defer to PT evaluation   Cervical / Trunk Assessment Cervical / Trunk Assessment: Kyphotic   Communication Communication Communication: Impaired Factors Affecting Communication: Hearing impaired   Cognition Arousal: Alert Behavior During Therapy: WFL for tasks assessed/performed, Flat affect Cognition: No family/caregiver present to determine baseline             OT - Cognition Comments: overall WFL for orinetation and basic command following, limited insight awareness noted. pt would benefit from a medication management assessment as he manages his meds and his brothers meds                 Following commands: Intact       Cueing  General Comments   Cueing Techniques: Verbal cues  VSS, orthostatic vital signs negative           Home Living Family/patient expects to be discharged to:: Private residence Living Arrangements: Other relatives (2 brothers) Available Help at Discharge: Family;Available 24 hours/day Type of Home: House Home Access: Stairs to enter [employer] of Steps: 3 Entrance Stairs-Rails: Right Home Layout: One level     Bathroom Shower/Tub: Chief Strategy Officer: Standard     Home Equipment: Cane - single point;Shower Counsellor (2 wheels);Wheelchair - manual;BSC/3in1          Prior Functioning/Environment Prior Level of Function : Independent/Modified Independent             Mobility Comments: no AD, denies falls (despite fall reported in chart review) ADLs Comments: indep ADLs, completes some IADLs, manages his own medications and his brothers. does not drive    OT Problem List: Decreased strength;Decreased activity tolerance;Decreased range of motion;Impaired balance (sitting and/or standing);Decreased safety awareness;Decreased knowledge of precautions;Decreased knowledge of use of DME or AE;Pain   OT Treatment/Interventions: Therapeutic  exercise;Self-care/ADL training;DME and/or AE instruction;Therapeutic activities;Patient/family education;Balance training      OT Goals(Current goals can be found in the care plan section)   Acute Rehab OT Goals Patient Stated Goal: to feel better OT Goal Formulation: With patient Time For Goal Achievement: 09/18/24 Potential to Achieve Goals: Good ADL Goals Pt Will Perform Grooming: with modified independence Pt Will Perform Upper Body Dressing: with modified independence Pt Will Perform Lower Body Dressing: with contact guard assist;sit to/from stand Pt Will Transfer to Toilet: with supervision;ambulating;regular height toilet Pt Will Perform Toileting - Clothing Manipulation and hygiene: with modified independence;sitting/lateral leans;sit to/from stand   OT Frequency:  Min 2X/week       AM-PAC OT 6 Clicks Daily Activity     Outcome Measure Help from another person eating meals?: None Help from another person taking care of personal grooming?: A Little Help from another person toileting, which includes using toliet, bedpan, or urinal?: A Lot Help from another person bathing (including washing, rinsing, drying)?: A Little Help from another person to put on and taking off regular upper body clothing?: A Little Help from another person to put on and taking off regular lower body clothing?: A Lot 6 Click Score: 17   End of Session Nurse Communication: Mobility status  Activity Tolerance: Patient tolerated treatment well Patient left: in bed;with call bell/phone within reach  OT Visit Diagnosis: Unsteadiness on feet (R26.81);Other abnormalities of gait and mobility (R26.89);Muscle weakness (generalized) (M62.81);Pain                Time: 0810-0828 OT Time Calculation (min): 18 min Charges:  OT General Charges $OT Visit: 1 Visit OT Evaluation $OT Eval Moderate Complexity: 1 Mod  Lucie Kendall, OTR/L Acute Rehabilitation Services Office [phone] Secure Chat  Communication Preferred   Lucie JONETTA Kendall 09/04/2024, 10:29 AM

## 2024-09-04 NOTE — Progress Notes (Signed)
 RLE venous exam is completed. Hason Ofarrell, RVT

## 2024-09-05 ENCOUNTER — Encounter: Payer: Self-pay | Admitting: Cardiovascular Disease

## 2024-09-05 DIAGNOSIS — E785 Hyperlipidemia, unspecified: Secondary | ICD-10-CM

## 2024-09-05 DIAGNOSIS — Z720 Tobacco use: Secondary | ICD-10-CM

## 2024-09-05 DIAGNOSIS — N1831 Chronic kidney disease, stage 3a: Secondary | ICD-10-CM | POA: Diagnosis not present

## 2024-09-05 DIAGNOSIS — D649 Anemia, unspecified: Secondary | ICD-10-CM | POA: Diagnosis not present

## 2024-09-05 NOTE — Progress Notes (Signed)
 Physical Therapy Treatment Patient Details Name: Martin Mason MRN: [id number] DOB: 1956-12-15 Today's Date: 09/05/2024   History of Present Illness Pt is a 68 y.o. M who presents 9/9 with complaints of syncopal episode versus seizure. ED his workup significant for potassium 3.4, sodium 134, CO2 of 20, creatinine at baseline 1.53, CT head with no acute findings, he was noted to have hemoglobin of 5.8. Recent hospitalization July 2025 significant for anemia. PMH: hypertension, hyperlipidemia, PAD, chronic HFmrEF, iron  deficiency anemia, and alcohol abuse in remission.    PT Comments  The pt was able to progress gait to ambulating up to ~120 ft today, displaying improved proximity to his RW. However, pt needs repeated multi-modal cues to improve his feet clearance and stance width to reduce his risk for falls. He is currently reliant on RW for stability when ambulating. If pt has the necessary support at d/c to frequently supervise and assist pt with standing mobility, ADLs, and cognitive tasks then pt may be able to d/c home with HHPT, otherwise he could benefit from short-term inpatient rehab, < 3 hours/day, to try to improve his independence and safety with mobility considering his recent frequent falls and inability to get up on his own at home per brother's report. Will continue to follow acutely.     If plan is discharge home, recommend the following: A little help with walking and/or transfers;A little help with bathing/dressing/bathroom;Assistance with cooking/housework;Direct supervision/assist for medications management;Direct supervision/assist for financial management;Assist for transportation;Help with stairs or ramp for entrance;Supervision due to cognitive status   Can travel by private vehicle     Yes  Equipment Recommendations  Rolling walker (2 wheels);BSC/3in1 (if there is not one already available to him at d/c)    Recommendations for Other Services       Precautions /  Restrictions Precautions Precautions: Fall;Other (comment) Precaution/Restrictions Comments: watch BP Restrictions Weight Bearing Restrictions Per Provider Order: No     Mobility  Bed Mobility               General bed mobility comments: Pt sitting in recliner upon arrival and at end of session.    Transfers Overall transfer level: Needs assistance Equipment used: Rolling walker (2 wheels), None Transfers: Sit to/from Stand Sit to Stand: Contact guard assist           General transfer comment: Pt slow to transfer to stand from recliner, resting posterior aspects of legs on chair for support, mild sway noted but no LOB, CGA for safety    Ambulation/Gait Ambulation/Gait assistance: Contact guard assist Gait Distance (Feet): 120 Feet Assistive device: Rolling walker (2 wheels) Gait Pattern/deviations: Step-through pattern, Decreased step length - right, Decreased step length - left, Decreased stride length, Decreased dorsiflexion - right, Decreased dorsiflexion - left, Shuffle, Trunk flexed, Narrow base of support Gait velocity: reduced Gait velocity interpretation: <1.31 ft/sec, indicative of household ambulator   General Gait Details: Pt takes slow, small, shuffling steps, needing VCs and visual cues to widen stance and clear his feet. Pt correcting momentarily and needed cues repeated. Pt did better remaining proximal within his RW today. No LOB, CGA for safety   Stairs             Wheelchair Mobility     Tilt Bed    Modified Rankin (Stroke Patients Only)       Balance Overall balance assessment: Needs assistance Sitting-balance support: Feet supported Sitting balance-Leahy Scale: Fair     Standing balance support: During functional activity, Bilateral  upper extremity supported Standing balance-Leahy Scale: Poor Standing balance comment: reliant on UE support                            Communication Communication Communication:  Impaired Factors Affecting Communication: Hearing impaired  Cognition Arousal: Alert Behavior During Therapy: WFL for tasks assessed/performed, Flat affect   PT - Cognitive impairments: No family/caregiver present to determine baseline                       PT - Cognition Comments: Pt needing cues repeated to correct gait pattern. Noted some questionable memory and awareness. Following commands: Impaired Following commands impaired: Follows multi-step commands with increased time    Cueing Cueing Techniques: Verbal cues  Exercises      General Comments General comments (skin integrity, edema, etc.): Educated pt if he decides to d/c home then to use a RW and keep a charged phone on him at all times to improve safety      Pertinent Vitals/Pain Pain Assessment Pain Assessment: Faces Faces Pain Scale: Hurts a little bit Pain Location: legs Pain Descriptors / Indicators: Discomfort, Guarding Pain Intervention(s): Limited activity within patient's tolerance, Monitored during session, Repositioned    Home Living                          Prior Function            PT Goals (current goals can now be found in the care plan section) Acute Rehab PT Goals Patient Stated Goal: to improve and go home PT Goal Formulation: With patient Time For Goal Achievement: 09/18/24 Potential to Achieve Goals: Good Progress towards PT goals: Progressing toward goals    Frequency    Min 3X/week      PT Plan      Co-evaluation              AM-PAC PT 6 Clicks Mobility   Outcome Measure  Help needed turning from your back to your side while in a flat bed without using bedrails?: A Little Help needed moving from lying on your back to sitting on the side of a flat bed without using bedrails?: A Little Help needed moving to and from a bed to a chair (including a wheelchair)?: A Little Help needed standing up from a chair using your arms (e.g., wheelchair or bedside  chair)?: A Little Help needed to walk in hospital room?: A Little Help needed climbing 3-5 steps with a railing? : A Lot 6 Click Score: 17    End of Session Equipment Utilized During Treatment: Gait belt Activity Tolerance: Patient tolerated treatment well Patient left: with call bell/phone within reach;in chair;with chair alarm set   PT Visit Diagnosis: Unsteadiness on feet (R26.81);Other abnormalities of gait and mobility (R26.89);Muscle weakness (generalized) (M62.81);Difficulty in walking, not elsewhere classified (R26.2);History of falling (Z91.81)     Time: 8763-8749 PT Time Calculation (min) (ACUTE ONLY): 14 min  Charges:    $Gait Training: 8-22 mins PT General Charges $$ ACUTE PT VISIT: 1 Visit                     Theo Ferretti, PT, DPT Acute Rehabilitation Services  Office: [phone]    Theo CHRISTELLA Ferretti 09/05/2024, 2:06 PM

## 2024-09-05 NOTE — TOC Initial Note (Signed)
 Transition of Care [facility]) - Initial/Assessment Note    Patient Details  Name: Martin Mason MRN: [id number] Date of Birth: Mar 09, 1956  [facility]) CM/SW Contact:    Inocente GORMAN Kindle, LCSW Phone Number: 09/05/2024, 1:41 PM  Clinical Narrative:                 CSW received consult for possible SNF placement at time of discharge. CSW spoke with patient. Patient reported that he lives with his two brothers (one works and one does not). Patient expressed understanding of PT recommendation and is reluctantly agreeable to SNF placement at time of discharge. CSW discussed insurance authorization process and will provide Medicare SNF ratings list. CSW will send out referrals for review and provide bed offers as available.    Skilled Nursing Rehab Facilities-   [email or web address]   Ratings out of 5 stars (5 the highest)  Name Address  Phone # Quality Care Staffing Health Inspection Overall  [facility] [address] [phone] 2 1 2 1   [facility] [address] [place] [phone] 5 2 4 5   [facility] Nursing [address], [place] [phone] 2 1 1 1   [facility] [address], [place] [phone] 4 3 4 4   [facility]  [address], [place] [phone] 4 3 5 5   [facility] [address], [facility] [phone] 5 3 2 3   [facility] [address] [place] [phone] 5 1 2 2   [facility] [phone] [address], [place] [phone] 3 4 4 4   [address] ([facility]) [address] [place] [phone] 1 3 3 2   [facility] [address] [place], [place] [phone] 4 2 2 2   [facility]) [address], [place] [phone] 2 1 1 1   Clotilda Pereyra [address] [place] [phone] 2 4 4 4   [facility] [address] [place] [phone] 3 1 3 2   [facility]) [address],  [place] [phone] 1 2 4 3           [facility] [address] [place] [phone] 3 1 5 4   [facility] [address] [place] [phone] 4 2 1 1   [facility]  [address] [place] [phone] 2 4 1 1   [facility] [address] [phone] 2 2 5 5   [facility] [address] [place] [address], [place] [phone] 2 2 3 3           [facility] [address] [place] [phone] 2 1 2 1   [facility] (No [facility]) [address], [place] [phone] 4 3 4 4   [facility] [address], [facility] [phone] 3  5 5   [facility] [address] [place] [phone] 4 2 1 1   Marion[facility] Milian [address] [place] [phone] 3 1 2 1   [facility] [address] [place] [phone] 1 3 3 2   [facility] [address] [place] [phone] 2 2 3 3   [facility] [address] [place], [place] [phone] 2 1 4 3   [facility] [address], [facility] [phone] 2 1 1 1   [facility] [address] [place], [place] [phone] 2 1 2 1   [facility] [address] [place] [phone] 3 1 2 1           [facility] [address] [place] [phone] 4 1  3 2  [facility] [address], [place]  [phone] 2 4 4 4   [facility] (No Humana) [address] [place] [phone] 3 2 5 5   [facility]) [address], [place] [phone] 3 2 3 3   [facility]  [address], [place] [phone] 5 3 5 5   [facility] [address], [place] [phone] 2 1 1 1   [facility] [address] [place], [place] [phone] 3 5 5 5           [facility] [address] [place], [place] [phone] 5 4 5 5   [facility])  [address] [place] [phone] 1 1 2 1   [facility]) [address], [place] [phone]  2 4 4    [facility] [address] [place] [phone] 3 5 5 5   [address] [place], [place] [phone] 4 2 2 2   [facility]) [address] [place] [phone] 1 1 3 1   [facility] [address], [place] [phone] 2 2 2 2      Expected Discharge Plan: Skilled Nursing Facility Barriers to Discharge: Continued Medical Work up, English as a second language teacher, SNF Pending bed offer   Patient Goals and CMS Choice Patient states their goals for this hospitalization and ongoing recovery are:: Rehab CMS Medicare.gov Compare Post Acute Care list provided to:: Patient Choice offered to / list presented to : Patient [place] ownership interest in [facility].provided to:: Patient    Expected Discharge Plan and Services In-house Referral: Clinical Social Work   Post Acute Care Choice: Skilled Nursing Facility Living arrangements for the past 2 months: Single Family Home                                      Prior Living Arrangements/Services Living arrangements for the past 2 months: Single Family Home Lives with:: Siblings Patient language and need for interpreter reviewed:: Yes Do you feel safe going back to the place where you live?: Yes      Need for Family Participation in Patient Care: Yes (Comment) Care giver support system in place?: Yes (comment)   Criminal Activity/Legal Involvement Pertinent to Current Situation/Hospitalization: No - Comment as needed  Activities of Daily Living   ADL Screening (condition at time of admission) Independently performs ADLs?: Yes (appropriate for developmental age) Is the patient deaf or have difficulty hearing?: Yes Does the patient have difficulty seeing, even when wearing glasses/contacts?: No Does the patient have difficulty concentrating, remembering, or making decisions?: No  Permission Sought/Granted Permission sought to share information with : Facility  Medical sales representative, Family Supports Permission granted to share information with : Yes, Verbal Permission Granted     Permission granted to share info w AGENCY: SNFs        Emotional Assessment Appearance:: Appears stated age Attitude/Demeanor/Rapport: Engaged, Gracious Affect (typically observed): Accepting, Pleasant, Appropriate Orientation: : Oriented to Self, Oriented to Place, Oriented to  Time, Oriented to Situation Alcohol / Substance Use: Not Applicable Psych Involvement: No (comment)  Admission diagnosis:  Fall, initial encounter [W19.XXXA] Symptomatic anemia [D64.9] Patient Active Problem List   Diagnosis Date Noted   AKI (acute kidney injury) (HCC) 06/26/2024   Symptomatic anemia 06/25/2024   Hyperlipidemia 03/12/2024   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 anemia 02/04/2024   Hypokalemia 02/04/2024   Infestation by  bed bug 02/04/2024   Iron  deficiency anemia 02/04/2024   CKD (chronic kidney disease), stage IIIa (HCC) 02/04/2024   Acute on chronic heart failure with mildly reduced ejection fraction (HFmrEF, 41-49%) (HCC) 02/03/2024   Atherosclerotic PVD with intermittent claudication (HCC) 11/22/2022   Tobacco abuse 03/01/2017   Other male erectile dysfunction 06/18/2015   Essential hypertension, benign 11/28/2013   HYPERTROPHY PROSTATE W/O UR OBST & OTH LUTS 03/22/2011   FLANK PAIN, RIGHT 03/22/2011   History of malignant neoplasm of large intestine 03/22/2011   DELIRIUM TREMENS 02/04/2011   Essential hypertension 09/21/2010   ELECTROCARDIOGRAM, ABNORMAL 09/21/2010   ALCOHOLISM 10/27/2009   PCP:  Delbert Clam, MD Pharmacy:   [facility] [place] - [address] [place] [address] Phone: [phone] Fax: [phone]  [facility] [address] [place] [address] Phone: [phone] Fax: [phone]     Social Drivers of Health (SDOH) Social  History: SDOH Screenings   Food Insecurity: No Food Insecurity (07/02/2024)  Housing: Unknown (07/02/2024)  Transportation Needs: No Transportation Needs (07/02/2024)  Utilities: Not At Risk (07/02/2024)  Alcohol Screen: Low Risk  (02/06/2023)  Depression (PHQ2-9): Low Risk  (07/02/2024)  Financial Resource Strain: Low Risk  (02/06/2023)  Physical Activity: Sufficiently Active (02/06/2023)  Social Connections: Patient Declined (06/26/2024)  Stress: No Stress Concern Present (02/06/2023)  Tobacco Use: High Risk (07/18/2024)   SDOH Interventions:     Readmission Risk Interventions    06/27/2024    1:22 PM  Readmission Risk Prevention Plan  Transportation Screening Complete  PCP or Specialist Appt within 5-7 Days Complete  Home Care Screening Complete  Medication Review (RN CM) Referral to Pharmacy

## 2024-09-05 NOTE — NC FL2 (Signed)
 [place]  MEDICAID FL2 LEVEL OF CARE FORM     IDENTIFICATION  Patient Name: Martin Mason Birthdate: [birth year] Sex: male Admission Date (Current Location): 09/03/2024  [facility] and [place] Number:  Producer, television/film/video and Address:  [facility] [place], [address] [place], [place] [address]      Provider Number: [id number]  Attending Physician Name and Address:  Raenelle Donalda HERO, MD  Relative Name and Phone Number:       Current Level of Care: Hospital Recommended Level of Care: Skilled Nursing Facility Prior Approval Number:    Date Approved/Denied: 09/05/24 PASRR Number: [id number]  Discharge Plan: SNF    Current Diagnoses: Patient Active Problem List   Diagnosis Date Noted   AKI (acute kidney injury) (HCC) 06/26/2024   Symptomatic anemia 06/25/2024   Hyperlipidemia 03/12/2024   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 anemia 02/04/2024   Hypokalemia 02/04/2024   Infestation by bed bug 02/04/2024   Iron  deficiency anemia 02/04/2024   CKD (chronic kidney disease), stage IIIa (HCC) 02/04/2024   Acute on chronic heart failure with mildly reduced ejection fraction (HFmrEF, 41-49%) (HCC) 02/03/2024   Atherosclerotic PVD with intermittent claudication (HCC) 11/22/2022   Tobacco abuse 03/01/2017   Other male erectile dysfunction 06/18/2015   Essential hypertension, benign 11/28/2013   HYPERTROPHY PROSTATE W/O UR OBST & OTH LUTS 03/22/2011   FLANK PAIN, RIGHT 03/22/2011   History of malignant neoplasm of large intestine 03/22/2011   DELIRIUM TREMENS 02/04/2011   Essential hypertension 09/21/2010   ELECTROCARDIOGRAM, ABNORMAL 09/21/2010   ALCOHOLISM 10/27/2009    Orientation RESPIRATION BLADDER Height & Weight     Self, Time, Situation, Place  Normal Continent Weight: 121 lb 11.1 oz (55.2 kg) Height:  5' 8 (172.7 cm)  BEHAVIORAL SYMPTOMS/MOOD NEUROLOGICAL BOWEL NUTRITION STATUS      Continent Diet (See dc summary)  AMBULATORY STATUS COMMUNICATION  OF NEEDS Skin   Limited Assist Verbally Normal                       Personal Care Assistance Level of Assistance  Bathing, Feeding, Dressing Bathing Assistance: Limited assistance Feeding assistance: Limited assistance Dressing Assistance: Limited assistance     Functional Limitations Info  Sight, Hearing Sight Info: Impaired Hearing Info: Impaired      SPECIAL CARE FACTORS FREQUENCY  PT (By licensed PT), OT (By licensed OT)     PT Frequency: 5x/week OT Frequency: 5x/week            Contractures Contractures Info: Not present    Additional Factors Info  Code Status, Allergies Code Status Info: FULL Allergies Info: NKA           Current Medications (09/05/2024):  This is the current hospital active medication list Current Facility-Administered Medications  Medication Dose Route Frequency Provider Last Rate Last Admin   acetaminophen  (TYLENOL ) tablet 650 mg  650 mg Oral Q6H PRN Elgergawy, Dawood S, MD   650 mg at 09/03/24 2118   Or   acetaminophen  (TYLENOL ) suppository 650 mg  650 mg Rectal Q6H PRN Elgergawy, Dawood S, MD       albuterol  (PROVENTIL ) (2.5 MG/3ML) 0.083% nebulizer solution 2.5 mg  2.5 mg Nebulization Q2H PRN Elgergawy, Dawood S, MD       atorvastatin  (LIPITOR) tablet 20 mg  20 mg Oral Daily Elgergawy, Dawood S, MD   20 mg at 09/05/24 1140   cholecalciferol  (VITAMIN D3) 10 MCG (400 UNIT) tablet 400 Units  400 Units  Oral Daily Elgergawy, Dawood S, MD   400 Units at 09/05/24 1140   cyanocobalamin  (VITAMIN B12) injection 1,000 mcg  1,000 mcg Subcutaneous Daily Elgergawy, Dawood S, MD   1,000 mcg at 09/05/24 1139   fluticasone  (FLONASE ) 50 MCG/ACT nasal spray 1 spray  1 spray Each Nare Daily Elgergawy, Dawood S, MD   1 spray at 09/05/24 1143   HYDROcodone -acetaminophen  (NORCO/VICODIN) 5-325 MG per tablet 1 tablet  1 tablet Oral Q6H PRN Howerter, Justin B, DO   1 tablet at 09/04/24 2243   methocarbamol  (ROBAXIN ) tablet 500 mg  500 mg Oral Q8H PRN  Howerter, Justin B, DO   500 mg at 09/04/24 2243   naloxone  (NARCAN ) injection 0.4 mg  0.4 mg Intravenous PRN Howerter, Justin B, DO       nicotine  (NICODERM CQ  - dosed in mg/24 hours) patch 21 mg  21 mg Transdermal Daily Elgergawy, Dawood S, MD   21 mg at 09/05/24 1142   pantoprazole  (PROTONIX ) injection 40 mg  40 mg Intravenous Q12H Elgergawy, Dawood S, MD   40 mg at 09/05/24 1139   potassium chloride  SA (KLOR-CON  M) CR tablet 20 mEq  20 mEq Oral Daily Elgergawy, Dawood S, MD   20 mEq at 09/05/24 1141     Discharge Medications: Please see discharge summary for a list of discharge medications.  Relevant Imaging Results:  Relevant Lab Results:   Additional Information SSN    [id number]  Inocente GORMAN Kindle, LCSW

## 2024-09-05 NOTE — NC FL2 (Incomplete)
 [place]  MEDICAID FL2 LEVEL OF CARE FORM     IDENTIFICATION  Patient Name: Martin Mason Birthdate: 1956/11/23 Sex: male Admission Date (Current Location): 09/03/2024  [facility] and [place] Number:  Producer, television/film/video and Address:  [facility] [place], [address] [place], [place] [address]      Provider Number: [id number]  Attending Physician Name and Address:  Raenelle Donalda HERO, MD  Relative Name and Phone Number:       Current Level of Care: Hospital Recommended Level of Care: Skilled Nursing Facility Prior Approval Number:    Date Approved/Denied: 09/05/24 PASRR Number:    Discharge Plan: SNF    Current Diagnoses: Patient Active Problem List   Diagnosis Date Noted   AKI (acute kidney injury) (HCC) 06/26/2024   Symptomatic anemia 06/25/2024   Hyperlipidemia 03/12/2024   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 anemia 02/04/2024   Hypokalemia 02/04/2024   Infestation by bed bug 02/04/2024   Iron  deficiency anemia 02/04/2024   CKD (chronic kidney disease), stage IIIa (HCC) 02/04/2024   Acute on chronic heart failure with mildly reduced ejection fraction (HFmrEF, 41-49%) (HCC) 02/03/2024   Atherosclerotic PVD with intermittent claudication (HCC) 11/22/2022   Tobacco abuse 03/01/2017   Other male erectile dysfunction 06/18/2015   Essential hypertension, benign 11/28/2013   HYPERTROPHY PROSTATE W/O UR OBST & OTH LUTS 03/22/2011   FLANK PAIN, RIGHT 03/22/2011   History of malignant neoplasm of large intestine 03/22/2011   DELIRIUM TREMENS 02/04/2011   Essential hypertension 09/21/2010   ELECTROCARDIOGRAM, ABNORMAL 09/21/2010   ALCOHOLISM 10/27/2009    Orientation RESPIRATION BLADDER Height & Weight     Situation, Self, Time, Place  Normal Continent Weight: 121 lb 11.1 oz (55.2 kg) Height:  5' 8 (172.7 cm)  BEHAVIORAL SYMPTOMS/MOOD NEUROLOGICAL BOWEL NUTRITION STATUS      Continent Diet  AMBULATORY STATUS COMMUNICATION OF NEEDS Skin   Limited  Assist Verbally Normal                       Personal Care Assistance Level of Assistance  Bathing, Feeding, Dressing Bathing Assistance: Limited assistance Feeding assistance: Limited assistance Dressing Assistance: Limited assistance     Functional Limitations Info             SPECIAL CARE FACTORS FREQUENCY                       Contractures Contractures Info: Not present    Additional Factors Info  Code Status, Allergies Code Status Info: FULL             Current Medications (09/05/2024):  This is the current hospital active medication list Current Facility-Administered Medications  Medication Dose Route Frequency Provider Last Rate Last Admin   acetaminophen  (TYLENOL ) tablet 650 mg  650 mg Oral Q6H PRN Elgergawy, Dawood S, MD   650 mg at 09/03/24 2118   Or   acetaminophen  (TYLENOL ) suppository 650 mg  650 mg Rectal Q6H PRN Elgergawy, Dawood S, MD       albuterol  (PROVENTIL ) (2.5 MG/3ML) 0.083% nebulizer solution 2.5 mg  2.5 mg Nebulization Q2H PRN Elgergawy, Dawood S, MD       atorvastatin  (LIPITOR) tablet 20 mg  20 mg Oral Daily Elgergawy, Dawood S, MD   20 mg at 09/05/24 1140   cholecalciferol  (VITAMIN D3) 10 MCG (400 UNIT) tablet 400 Units  400 Units Oral Daily Elgergawy, Dawood S, MD   400 Units at 09/05/24 1140  cyanocobalamin  (VITAMIN B12) injection 1,000 mcg  1,000 mcg Subcutaneous Daily Elgergawy, Dawood S, MD   1,000 mcg at 09/05/24 1139   fluticasone  (FLONASE ) 50 MCG/ACT nasal spray 1 spray  1 spray Each Nare Daily Elgergawy, Dawood S, MD   1 spray at 09/05/24 1143   HYDROcodone -acetaminophen  (NORCO/VICODIN) 5-325 MG per tablet 1 tablet  1 tablet Oral Q6H PRN Howerter, Justin B, DO   1 tablet at 09/04/24 2243   methocarbamol  (ROBAXIN ) tablet 500 mg  500 mg Oral Q8H PRN Howerter, Justin B, DO   500 mg at 09/04/24 2243   naloxone  (NARCAN ) injection 0.4 mg  0.4 mg Intravenous PRN Howerter, Justin B, DO       nicotine  (NICODERM CQ  - dosed in mg/24  hours) patch 21 mg  21 mg Transdermal Daily Elgergawy, Dawood S, MD   21 mg at 09/05/24 1142   pantoprazole  (PROTONIX ) injection 40 mg  40 mg Intravenous Q12H Elgergawy, Dawood S, MD   40 mg at 09/05/24 1139   potassium chloride  SA (KLOR-CON  M) CR tablet 20 mEq  20 mEq Oral Daily Elgergawy, Dawood S, MD   20 mEq at 09/05/24 1141     Discharge Medications: Please see discharge summary for a list of discharge medications.  Relevant Imaging Results:  Relevant Lab Results:   Additional Information    Sharyne Drum, Student-Social Work

## 2024-09-05 NOTE — Progress Notes (Addendum)
 PROGRESS NOTE        PATIENT DETAILS Name: Martin Mason Age: 68 y.o. Sex: male Date of Birth: [birth year] Admit Date: 09/03/2024 Admitting Physician Brayton GORMAN Lye, MD ERE:Wztopw, Corrina, MD  Brief Summary: Patient is a 68 y.o.  male with history of HTN, HLD, HFmrEF, PAD, recent hospitalization for severe anemia requiring blood transfusion-endoscopy studies showed AVM-presented to the hospital with fall/weakness-found to have hemoglobin of 5.8.  Significant events: 9/9>> admit to [facility]  Significant studies: 9/9>> CT head: No acute intracranial process 9/9>> x-ray left hip: No fracture. 9/10>> RLE Doppler: No DVT.  Significant microbiology data: None  Procedures: None  Consults: None  Subjective: Lying comfortably in bed-denies any chest pain or shortness of breath.  Denies any recent melena or hematochezia.  Objective: Vitals: Blood pressure 132/77, pulse (!) 58, temperature 98.4 F (36.9 C), temperature source Oral, resp. rate 15, height 5' 8 (1.727 m), weight 55.2 kg, SpO2 97%.   Exam: Gen Exam:Alert awake-not in any distress HEENT:atraumatic, normocephalic Chest: B/L clear to auscultation anteriorly CVS:S1S2 regular Abdomen:soft non tender, non distended Extremities:no edema Neurology: Non focal Skin: no rash  Pertinent Labs/Radiology:    Latest Ref Rng & Units 09/04/2024    2:44 PM 09/03/2024    2:16 PM 09/03/2024   12:47 PM  CBC  WBC 4.0 - 10.5 K/uL 10.4   6.9   Hemoglobin 13.0 - 17.0 g/dL 89.8  7.1  5.8   Hematocrit 39.0 - 52.0 % 33.5  21.0  21.2   Platelets 150 - 400 K/uL 337   327     Lab Results  Component Value Date   NA 131 (L) 09/04/2024   K 3.0 (L) 09/04/2024   CL 98 09/04/2024   CO2 21 (L) 09/04/2024      Assessment/Plan: Mechanical fall/presyncope Probably in the setting of severe anemia Never lost consciousness Orthostatic vital signs 9/10 were negative. Continue PT/OT Recent echo with mildly reduced  EF  Severe microcytic anemia Probably had recent occult GI bleeding-probably not noticed by patient-poor historian Iron  indices consistent with iron  deficient anemia Prior endoscopies studies with AVMs-which likely-the source of slow GI bleeding. Hb stable after a total of 2 units of PRBC-last transfused 9/10.  Also given 1 dose of IV iron  on 9/10 Although not actively bleeding-Will get GI opinion-if patient warrants repeat endoscopy evaluation given severity of anemia.  Addendum: Discussed with [facility] team-per GI PA-C-Dr Shila has reviewed chart-recommends supportive care and iron  supplementation. No endoscopy recommended at this time.   CKD stage IIIb At baseline  Hypokalemia Replete/recheck  HFmrEF Euvolemic Will dose furosemide  as needed.  HLD Statin  HTN BP stable Lisinopril /HCTZ/amlodipine  remain on hold  RLE swelling Doppler negative.   Code status:   Code Status: Full Code   DVT Prophylaxis: SCDs Start: 09/03/24 1747   Family Communication: None at bedside   Disposition Plan: Status is: Inpatient Remains inpatient appropriate because: Severity of anemia   Planned Discharge Destination:Skilled nursing facility   Diet: Diet Order             Diet clear liquid Room service appropriate? Yes; Fluid consistency: Thin  Diet effective now                     Antimicrobial agents: Anti-infectives (From admission, onward)    None  MEDICATIONS: Scheduled Meds:  atorvastatin   20 mg Oral Daily   cholecalciferol   400 Units Oral Daily   cyanocobalamin   1,000 mcg Subcutaneous Daily   fluticasone   1 spray Each Nare Daily   nicotine   21 mg Transdermal Daily   pantoprazole  (PROTONIX ) IV  40 mg Intravenous Q12H   potassium chloride  SA  20 mEq Oral Daily   Continuous Infusions: PRN Meds:.acetaminophen  **OR** acetaminophen , albuterol , HYDROcodone -acetaminophen , methocarbamol , naLOXone  (NARCAN )  injection   I have personally reviewed  following labs and imaging studies  LABORATORY DATA: CBC: Recent Labs  Lab 09/03/24 1247 09/03/24 1416 09/04/24 1444  WBC 6.9  --  10.4  HGB 5.8* 7.1* 10.1*  HCT 21.2* 21.0* 33.5*  MCV 65.4*  --  68.1*  PLT 327  --  337    Basic Metabolic Panel: Recent Labs  Lab 09/03/24 1247 09/03/24 1416 09/04/24 1444  NA 134* 136 131*  K 3.4* 3.5 3.0*  CL 102 102 98  CO2 20*  --  21*  GLUCOSE 85 84 99  BUN 23 22 14   CREATININE 1.53* 1.80* 1.33*  CALCIUM  9.0  --  8.9    GFR: Estimated Creatinine Clearance: 40.9 mL/min (A) (by C-G formula based on SCr of 1.33 mg/dL (H)).  Liver Function Tests: Recent Labs  Lab 09/04/24 1444  AST 29  ALT 20  ALKPHOS 70  BILITOT 1.4*  PROT 7.0  ALBUMIN  3.4*   No results for input(s): LIPASE, AMYLASE in the last 168 hours. No results for input(s): AMMONIA in the last 168 hours.  Coagulation Profile: No results for input(s): INR, PROTIME in the last 168 hours.  Cardiac Enzymes: No results for input(s): CKTOTAL, CKMB, CKMBINDEX, TROPONINI in the last 168 hours.  BNP (last 3 results) Recent Labs    01/17/24 1447  PROBNP 2,597*    Lipid Profile: No results for input(s): CHOL, HDL, LDLCALC, TRIG, CHOLHDL, LDLDIRECT in the last 72 hours.  Thyroid Function Tests: No results for input(s): TSH, T4TOTAL, FREET4, T3FREE, THYROIDAB in the last 72 hours.  Anemia Panel: Recent Labs    09/03/24 1717  VITAMINB12 420  FOLATE 16.9  FERRITIN 8*  TIBC 357  IRON  12*  RETICCTPCT 1.7    Urine analysis:    Component Value Date/Time   COLORURINE STRAW (A) 02/03/2024 1536   APPEARANCEUR CLEAR 02/03/2024 1536   LABSPEC 1.008 02/03/2024 1536   PHURINE 6.0 02/03/2024 1536   GLUCOSEU NEGATIVE 02/03/2024 1536   HGBUR NEGATIVE 02/03/2024 1536   BILIRUBINUR NEGATIVE 02/03/2024 1536   KETONESUR NEGATIVE 02/03/2024 1536   PROTEINUR NEGATIVE 02/03/2024 1536   UROBILINOGEN 1.0 06/14/2010 2004   NITRITE  NEGATIVE 02/03/2024 1536   LEUKOCYTESUR NEGATIVE 02/03/2024 1536    Sepsis Labs: Lactic Acid, Venous No results found for: LATICACIDVEN  MICROBIOLOGY: No results found for this or any previous visit (from the past 240 hours).  RADIOLOGY STUDIES/RESULTS: VAS US  LOWER EXTREMITY VENOUS (DVT) Result Date: 09/04/2024  Lower Venous DVT Study Patient Name:  AB LEAMING  Date of Exam:   09/04/2024 Medical Rec #: [id number]    Accession #:    [id number] Date of Birth: 07-18-1956   Patient Gender: M Patient Age:   21 years Exam Location:  [facility] Procedure:      VAS US  LOWER EXTREMITY VENOUS (DVT) Referring Phys: DAWOOD ELGERGAWY --------------------------------------------------------------------------------  Indications: Edema.  Limitations: Pt has bedbugs. Performing Technologist: Elmarie Lindau, RVT  Examination Guidelines: A complete evaluation includes B-mode imaging, spectral Doppler, color Doppler, and power Doppler  as needed of all accessible portions of each vessel. Bilateral testing is considered an integral part of a complete examination. Limited examinations for reoccurring indications may be performed as noted. The reflux portion of the exam is performed with the patient in reverse Trendelenburg.  +---------+---------------+---------+-----------+----------+--------------+ RIGHT    CompressibilityPhasicitySpontaneityPropertiesThrombus Aging +---------+---------------+---------+-----------+----------+--------------+ CFV      Full           Yes      Yes                                 +---------+---------------+---------+-----------+----------+--------------+ SFJ      Full                                                        +---------+---------------+---------+-----------+----------+--------------+ FV Prox  Full                                                        +---------+---------------+---------+-----------+----------+--------------+ FV Mid   Full                                                         +---------+---------------+---------+-----------+----------+--------------+ FV DistalFull                                                        +---------+---------------+---------+-----------+----------+--------------+ PFV      Full                                                        +---------+---------------+---------+-----------+----------+--------------+ POP      Full           Yes      Yes                                 +---------+---------------+---------+-----------+----------+--------------+ PTV      Full                                                        +---------+---------------+---------+-----------+----------+--------------+ PERO     Full                                                        +---------+---------------+---------+-----------+----------+--------------+ Incidental finding:  There appears to be an occluded mid superficial femoral artery that reconstitutes at distal superficial femoral artery.  Right Technical Findings: Pt had abnormal ABI on 02/13/2024    Summary: RIGHT: - There is no evidence of deep vein thrombosis in the lower extremity.  - No cystic structure found in the popliteal fossa. - Occluded right mid superficial femoral artery.   *See table(s) above for measurements and observations. Electronically signed by Lonni Gaskins MD on 09/04/2024 at 12:11:13 PM.    Final    EEG adult Result Date: 09/04/2024 Shelton Arlin KIDD, MD     09/04/2024  8:33 AM Patient Name: Leobardo Granlund MRN: [id number] Epilepsy Attending: Arlin KIDD Shelton Referring Physician/Provider: Sherlon Brayton RAMAN, MD Date: 09/03/2024 Duration: 36.58 mins Patient history: 68yo M with syncope. EEG to evaluate for seizure Level of alertness: Awake AEDs during EEG study: None Technical aspects: This EEG study was done with scalp electrodes positioned according to the 10-20 International system of electrode placement.  Electrical activity was reviewed with band pass filter of 1-70Hz , sensitivity of 7 uV/mm, display speed of 40mm/sec with a 60Hz  notched filter applied as appropriate. EEG data were recorded continuously and digitally stored.  Video monitoring was available and reviewed as appropriate. Description: The posterior dominant rhythm consists of 7Hz  activity of moderate voltage (25-35 uV) seen predominantly in posterior head regions, symmetric and reactive to eye opening and eye closing. Hyperventilation and photic stimulation were not performed.   ABNORMALITY - Background slow IMPRESSION: This study is suggestive of mild diffuse encephalopathy. No seizures or epileptiform discharges were seen throughout the recording. Priyanka O Yadav   DG Hip Unilat W or Wo Pelvis 2-3 Views Left Result Date: 09/03/2024 CLINICAL DATA:  Status post fall. EXAM: DG HIP (WITH OR WITHOUT PELVIS) 2-3V LEFT COMPARISON:  None Available. FINDINGS: There is no evidence of an acute hip fracture or dislocation. Mild degenerative changes are seen involving both hips in the form of joint space narrowing and acetabular sclerosis. Marked severity vascular calcification is seen. IMPRESSION: Mild degenerative changes without evidence of an acute fracture or dislocation. Electronically Signed   By: Suzen Dials M.D.   On: 09/03/2024 16:11   CT Head Wo Contrast Result Date: 09/03/2024 CLINICAL DATA:  New onset seizure EXAM: CT HEAD WITHOUT CONTRAST TECHNIQUE: Contiguous axial images were obtained from the base of the skull through the vertex without intravenous contrast. RADIATION DOSE REDUCTION: This exam was performed according to the departmental dose-optimization program which includes automated exposure control, adjustment of the mA and/or kV according to patient size and/or use of iterative reconstruction technique. COMPARISON:  01/06/2012 FINDINGS: Brain: No acute infarct or hemorrhage. Lateral ventricles and midline structures are  unremarkable. No acute extra-axial fluid collections. No mass effect. Vascular: No hyperdense vessel or unexpected calcification. Skull: Normal. Negative for fracture or focal lesion. Sinuses/Orbits: Mild mucosal thickening and retained secretions within the right sphenoid sinus. 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 Other: None. IMPRESSION: 1. No acute intracranial process. 2. Minimal sphenoid sinus disease as above. Electronically Signed   By: Ozell Daring M.D.   On: 09/03/2024 16:04     LOS: 2 days   Donalda Applebaum, MD  [facility]    To contact the attending provider between 7A-7P or the covering provider during after hours 7P-7A, please log into the web site [email or web address] and access using universal [place] password for that web site. If you do not have the password, please call the hospital  operator.  09/05/2024, 11:33 AM

## 2024-09-05 NOTE — Plan of Care (Signed)

## 2024-09-06 ENCOUNTER — Encounter (HOSPITAL_COMMUNITY): Payer: Self-pay | Admitting: Internal Medicine

## 2024-09-06 DIAGNOSIS — E785 Hyperlipidemia, unspecified: Secondary | ICD-10-CM | POA: Diagnosis not present

## 2024-09-06 DIAGNOSIS — I10 Essential (primary) hypertension: Secondary | ICD-10-CM | POA: Diagnosis not present

## 2024-09-06 DIAGNOSIS — D649 Anemia, unspecified: Secondary | ICD-10-CM | POA: Diagnosis not present

## 2024-09-06 DIAGNOSIS — N1831 Chronic kidney disease, stage 3a: Secondary | ICD-10-CM | POA: Diagnosis not present

## 2024-09-06 NOTE — TOC Progression Note (Signed)
 Transition of Care [facility]) - Progression Note    Patient Details  Name: Martin Mason MRN: [id number] Date of Birth: Oct 02, 1956  [facility] CM/SW Contact  Luann SHAUNNA [place], [place] Phone Number: 09/06/2024, 1:44 PM  Clinical Narrative:     CSW met with pt and provided SNF bed offers and medicare star ratings. Pt chooses [facility]. CSW confirmed bed offer with [facility]. They will begin [employer] auth.    Expected Discharge Plan: Skilled Nursing Facility Barriers to Discharge: Insurance Authorization               Expected Discharge Plan and Services In-house Referral: Clinical Social Work   Post Acute Care Choice: Skilled Nursing Facility Living arrangements for the past 2 months: Single Family Home                                       Social Drivers of Health (SDOH) Interventions SDOH Screenings   Food Insecurity: No Food Insecurity (09/05/2024)  Housing: Low Risk  (09/05/2024)  Transportation Needs: No Transportation Needs (09/05/2024)  Utilities: Not At Risk (09/05/2024)  Alcohol Screen: Low Risk  (02/06/2023)  Depression (PHQ2-9): Low Risk  (07/02/2024)  Financial Resource Strain: Low Risk  (02/06/2023)  Physical Activity: Sufficiently Active (02/06/2023)  Social Connections: Patient Declined (09/05/2024)  Stress: No Stress Concern Present (02/06/2023)  Tobacco Use: High Risk (09/06/2024)    Readmission Risk Interventions    06/27/2024    1:22 PM  Readmission Risk Prevention Plan  Transportation Screening Complete  PCP or Specialist Appt within 5-7 Days Complete  Home Care Screening Complete  Medication Review (RN CM) Referral to Pharmacy

## 2024-09-06 NOTE — Progress Notes (Addendum)
   09/06/24 1340  Mobility  Activity Ambulated with assistance  Level of Assistance Contact guard assist, steadying assist  Assistive Device Front wheel walker  Distance Ambulated (ft) 150 ft  Activity Response Tolerated fair  Mobility Referral Yes  Mobility visit 1 Mobility  Mobility Specialist Start Time (ACUTE ONLY) 1340  Mobility Specialist Stop Time (ACUTE ONLY) 1355  Mobility Specialist Time Calculation (min) (ACUTE ONLY) 15 min   Mobility Specialist: Progress Note- Visits:2   Pt agreeable to mobility session - received in chair. Pt was asymptomatic throughout session with no complaints. Returned to bed with all needs met - call bell within reach. Bed alarm on.   ______________________________________________________________________________  Pt agreeable to mobility session - received in bed. Pt was asymptomatic throughout session with no complaints. Returned to chair with all needs met - call bell within reach. Chair alarm on.   Virgle Boards, BS Mobility Specialist Please contact via SecureChat or  Rehab office at [phone].SABRA

## 2024-09-06 NOTE — Progress Notes (Signed)
 PROGRESS NOTE        PATIENT DETAILS Name: Martin Mason Age: 68 y.o. Sex: male Date of Birth: 1956-01-01 Admit Date: 09/03/2024 Admitting Physician Brayton GORMAN Lye, MD ERE:Wztopw, Corrina, MD  Brief Summary: Patient is a 68 y.o.  male with history of HTN, HLD, HFmrEF, PAD, recent hospitalization for severe anemia requiring blood transfusion-endoscopy studies showed AVM-presented to the hospital with fall/weakness-found to have hemoglobin of 5.8.  Significant events: 9/9>> admit to [facility]  Significant studies: 9/9>> CT head: No acute intracranial process 9/9>> x-ray left hip: No fracture. 9/10>> RLE Doppler: No DVT.  Significant microbiology data: None  Procedures: None  Consults: None  Subjective: Lying comfortably in bed-no major issues overnight.  Objective: Vitals: Blood pressure 104/72, pulse 61, temperature 97.7 F (36.5 C), temperature source Oral, resp. rate 12, height 5' 8 (1.727 m), weight 55.2 kg, SpO2 97%.   Exam: Awake/alert Atraumatic/normocephalic Abdomen: Soft nontender nondistended Extremities: No edema.  Pertinent Labs/Radiology:    Latest Ref Rng & Units 09/04/2024    2:44 PM 09/03/2024    2:16 PM 09/03/2024   12:47 PM  CBC  WBC 4.0 - 10.5 K/uL 10.4   6.9   Hemoglobin 13.0 - 17.0 g/dL 89.8  7.1  5.8   Hematocrit 39.0 - 52.0 % 33.5  21.0  21.2   Platelets 150 - 400 K/uL 337   327     Lab Results  Component Value Date   NA 131 (L) 09/04/2024   K 3.0 (L) 09/04/2024   CL 98 09/04/2024   CO2 21 (L) 09/04/2024      Assessment/Plan: Mechanical fall/presyncope Probably in the setting of severe anemia Never lost consciousness Orthostatic vital signs 9/10 were negative. 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 Hb stable after a total of 2 units of PRBC-last transfused 9/10.  Also given 1 dose of IV iron  on 9/10 Discussed with [facility] team-on 9/11-per GI PA-C-Dr Shila has reviewed chart-recommends supportive care and iron  supplementation. No endoscopy recommended at this time.   CKD stage IIIb At baseline  Hypokalemia Continue to replete/recheck.  HFmrEF Euvolemic Will dose furosemide  as needed.  HLD Statin  HTN BP stable Lisinopril /HCTZ/amlodipine  remain on hold  RLE swelling Doppler negative.   Code status:   Code Status: Full Code   DVT Prophylaxis: Place TED hose Start: 09/05/24 1142 SCDs Start: 09/03/24 1747   Family Communication: None at bedside   Disposition Plan: Status is: Inpatient Remains inpatient appropriate because: Severity of anemia   Planned Discharge Destination:Skilled nursing facility   Diet: Diet Order             Diet clear liquid Room service appropriate? Yes; Fluid consistency: Thin  Diet effective now                     Antimicrobial agents: Anti-infectives (From admission, onward)    None        MEDICATIONS: Scheduled Meds:  atorvastatin   20 mg Oral Daily   cholecalciferol   400 Units Oral Daily   cyanocobalamin   1,000 mcg Subcutaneous Daily   fluticasone   1 spray Each Nare Daily   nicotine   21  mg Transdermal Daily   pantoprazole  (PROTONIX ) IV  40 mg Intravenous Q12H   potassium chloride  SA  20 mEq Oral Daily   Continuous Infusions: PRN Meds:.acetaminophen  **OR** acetaminophen , albuterol , HYDROcodone -acetaminophen , methocarbamol , naLOXone  (NARCAN )  injection   I have personally reviewed following labs and imaging studies  LABORATORY DATA: CBC: Recent Labs  Lab 09/03/24 1247 09/03/24 1416 09/04/24 1444  WBC 6.9  --  10.4  HGB 5.8* 7.1* 10.1*  HCT 21.2* 21.0* 33.5*  MCV 65.4*  --  68.1*  PLT 327  --  337    Basic Metabolic Panel: Recent Labs  Lab  09/03/24 1247 09/03/24 1416 09/04/24 1444  NA 134* 136 131*  K 3.4* 3.5 3.0*  CL 102 102 98  CO2 20*  --  21*  GLUCOSE 85 84 99  BUN 23 22 14   CREATININE 1.53* 1.80* 1.33*  CALCIUM  9.0  --  8.9    GFR: Estimated Creatinine Clearance: 40.9 mL/min (A) (by C-G formula based on SCr of 1.33 mg/dL (H)).  Liver Function Tests: Recent Labs  Lab 09/04/24 1444  AST 29  ALT 20  ALKPHOS 70  BILITOT 1.4*  PROT 7.0  ALBUMIN  3.4*   No results for input(s): LIPASE, AMYLASE in the last 168 hours. No results for input(s): AMMONIA in the last 168 hours.  Coagulation Profile: No results for input(s): INR, PROTIME in the last 168 hours.  Cardiac Enzymes: No results for input(s): CKTOTAL, CKMB, CKMBINDEX, TROPONINI in the last 168 hours.  BNP (last 3 results) Recent Labs    01/17/24 1447  PROBNP 2,597*    Lipid Profile: No results for input(s): CHOL, HDL, LDLCALC, TRIG, CHOLHDL, LDLDIRECT in the last 72 hours.  Thyroid Function Tests: No results for input(s): TSH, T4TOTAL, FREET4, T3FREE, THYROIDAB in the last 72 hours.  Anemia Panel: Recent Labs    09/03/24 1717  VITAMINB12 420  FOLATE 16.9  FERRITIN 8*  TIBC 357  IRON  12*  RETICCTPCT 1.7    Urine analysis:    Component Value Date/Time   COLORURINE STRAW (A) 02/03/2024 1536   APPEARANCEUR CLEAR 02/03/2024 1536   LABSPEC 1.008 02/03/2024 1536   PHURINE 6.0 02/03/2024 1536   GLUCOSEU NEGATIVE 02/03/2024 1536   HGBUR NEGATIVE 02/03/2024 1536   BILIRUBINUR NEGATIVE 02/03/2024 1536   KETONESUR NEGATIVE 02/03/2024 1536   PROTEINUR NEGATIVE 02/03/2024 1536   UROBILINOGEN 1.0 06/14/2010 2004   NITRITE NEGATIVE 02/03/2024 1536   LEUKOCYTESUR NEGATIVE 02/03/2024 1536    Sepsis Labs: Lactic Acid, Venous No results found for: LATICACIDVEN  MICROBIOLOGY: No results found for this or any previous visit (from the past 240 hours).  RADIOLOGY STUDIES/RESULTS: VAS US  LOWER  EXTREMITY VENOUS (DVT) Result Date: 09/04/2024  Lower Venous DVT Study Patient Name:  Martin Mason  Date of Exam:   09/04/2024 Medical Rec #: [id number]    Accession #:    [id number] Date of Birth: [birth year]   Patient Gender: M Patient Age:   38 years Exam Location:  [facility] Procedure:      VAS US  LOWER EXTREMITY VENOUS (DVT) Referring Phys: DAWOOD ELGERGAWY --------------------------------------------------------------------------------  Indications: Edema.  Limitations: Pt has bedbugs. Performing Technologist: Elmarie Lindau, RVT  Examination Guidelines: A complete evaluation includes B-mode imaging, spectral Doppler, color Doppler, and power Doppler as needed of all accessible portions of each vessel. Bilateral testing is considered an integral part of a complete examination. Limited examinations for reoccurring indications may be performed as noted. The reflux portion of the exam is performed with the  patient in reverse Trendelenburg.  +---------+---------------+---------+-----------+----------+--------------+ RIGHT    CompressibilityPhasicitySpontaneityPropertiesThrombus Aging +---------+---------------+---------+-----------+----------+--------------+ CFV      Full           Yes      Yes                                 +---------+---------------+---------+-----------+----------+--------------+ SFJ      Full                                                        +---------+---------------+---------+-----------+----------+--------------+ FV Prox  Full                                                        +---------+---------------+---------+-----------+----------+--------------+ FV Mid   Full                                                        +---------+---------------+---------+-----------+----------+--------------+ FV DistalFull                                                         +---------+---------------+---------+-----------+----------+--------------+ PFV      Full                                                        +---------+---------------+---------+-----------+----------+--------------+ POP      Full           Yes      Yes                                 +---------+---------------+---------+-----------+----------+--------------+ PTV      Full                                                        +---------+---------------+---------+-----------+----------+--------------+ PERO     Full                                                        +---------+---------------+---------+-----------+----------+--------------+ Incidental finding: There appears to be an occluded mid superficial femoral artery that reconstitutes at distal superficial femoral artery.  Right Technical Findings: Pt had abnormal ABI on 02/13/2024    Summary: RIGHT: - There is no evidence of deep vein  thrombosis in the lower extremity.  - No cystic structure found in the popliteal fossa. - Occluded right mid superficial femoral artery.   *See table(s) above for measurements and observations. Electronically signed by Lonni Gaskins MD on 09/04/2024 at 12:11:13 PM.    Final      LOS: 3 days   Donalda Applebaum, MD  [facility]    To contact the attending provider between 7A-7P or the covering provider during after hours 7P-7A, please log into the web site [email or web address] and access using universal [place] password for that web site. If you do not have the password, please call the hospital operator.  09/06/2024, 10:22 AM

## 2024-09-06 NOTE — Progress Notes (Signed)
 Occupational Therapy Treatment Patient Details Name: Martin Mason MRN: [id number] DOB: 1956/09/18 Today's Date: 09/06/2024   History of present illness Pt is a 68 y.o. M who presents 9/9 with complaints of syncopal episode versus seizure. ED his workup significant for potassium 3.4, sodium 134, CO2 of 20, creatinine at baseline 1.53, CT head with no acute findings, he was noted to have hemoglobin of 5.8. Recent hospitalization July 2025 significant for anemia. PMH: hypertension, hyperlipidemia, PAD, chronic HFmrEF, iron  deficiency anemia, and alcohol abuse in remission.   OT comments  Patient making good gains with OT treatment. Patient received in supine with legs hanging over side of bed and was able to raise trunk with supervision. Patient declined RW and performed in room mobility and transfers with HHA.  Patient able to perform self care tasks with supervision to min assist with LB ADLs.  Patient will benefit from continued inpatient follow up therapy, <3 hours/day with patient having potential to flip to home if continues to improve with mobility and self care.  Acute OT to continue to follow to address established goals to facilitate DC to next venue of care.        If plan is discharge home, recommend the following:  A lot of help with walking and/or transfers;A lot of help with bathing/dressing/bathroom;Assistance with cooking/housework;Help with stairs or ramp for entrance;Assist for transportation   Equipment Recommendations  None recommended by OT    Recommendations for Other Services      Precautions / Restrictions Precautions Precautions: Fall;Other (comment) Precaution/Restrictions Comments: watch BP Restrictions Weight Bearing Restrictions Per Provider Order: No       Mobility Bed Mobility Overal bed mobility: Needs Assistance Bed Mobility: Supine to Sit     Supine to sit: Supervision, HOB elevated, Used rails     General bed mobility comments: patient with BLEs  hanging off edge of bed and was able to raise trunk with supervision    Transfers Overall transfer level: Needs assistance Equipment used: None Transfers: Sit to/from Stand Sit to Stand: Contact guard assist           General transfer comment: patient declined RW and was able to ambulate short distances to sink and recliner with HHA     Balance Overall balance assessment: Needs assistance Sitting-balance support: Feet supported Sitting balance-Leahy Scale: Fair     Standing balance support: During functional activity, Single extremity supported Standing balance-Leahy Scale: Poor Standing balance comment: reliant on one extremity support                           ADL either performed or assessed with clinical judgement   ADL Overall ADL's : Needs assistance/impaired     Grooming: Wash/dry face;Wash/dry hands;Supervision/safety;Standing           Upper Body Dressing : Set up;Sitting Upper Body Dressing Details (indicate cue type and reason): changed gowns Lower Body Dressing: Minimal assistance;Sit to/from stand Lower Body Dressing Details (indicate cue type and reason): changed socks             Functional mobility during ADLs: Contact guard assist General ADL Comments: cues for safety, in room mobility with [facility]    Extremity/Trunk Assessment              Vision       Perception     Praxis     Communication Communication Communication: Impaired Factors Affecting Communication: Hearing impaired   Cognition Arousal: Alert Behavior During  Therapy: WFL for tasks assessed/performed, Flat affect Cognition: No family/caregiver present to determine baseline             OT - Cognition Comments: oriented x4 but required cues for safety                 Following commands: Impaired Following commands impaired: Follows multi-step commands with increased time      Cueing   Cueing Techniques: Verbal cues  Exercises       Shoulder Instructions       General Comments      Pertinent Vitals/ Pain       Pain Assessment Pain Assessment: Faces Faces Pain Scale: Hurts a little bit Pain Location: legs Pain Descriptors / Indicators: Discomfort, Guarding Pain Intervention(s): Limited activity within patient's tolerance, Repositioned  Home Living                                          Prior Functioning/Environment              Frequency  Min 2X/week        Progress Toward Goals  OT Goals(current goals can now be found in the care plan section)  Progress towards OT goals: Progressing toward goals  Acute Rehab OT Goals Patient Stated Goal: to get off liquid diet OT Goal Formulation: With patient Time For Goal Achievement: 09/18/24 Potential to Achieve Goals: Good ADL Goals Pt Will Perform Grooming: with modified independence Pt Will Perform Upper Body Dressing: with modified independence Pt Will Perform Lower Body Dressing: with contact guard assist;sit to/from stand Pt Will Transfer to Toilet: with supervision;ambulating;regular height toilet Pt Will Perform Toileting - Clothing Manipulation and hygiene: with modified independence;sitting/lateral leans;sit to/from stand  Plan      Co-evaluation                 AM-PAC OT 6 Clicks Daily Activity     Outcome Measure   Help from another person eating meals?: None Help from another person taking care of personal grooming?: A Little Help from another person toileting, which includes using toliet, bedpan, or urinal?: A Little Help from another person bathing (including washing, rinsing, drying)?: A Little Help from another person to put on and taking off regular upper body clothing?: A Little Help from another person to put on and taking off regular lower body clothing?: A Little 6 Click Score: 19    End of Session Equipment Utilized During Treatment: Gait belt  OT Visit Diagnosis: Unsteadiness on feet  (R26.81);Other abnormalities of gait and mobility (R26.89);Muscle weakness (generalized) (M62.81);Pain Pain - Right/Left:  (bilateral) Pain - part of body: Leg   Activity Tolerance Patient tolerated treatment well   Patient Left in chair;with call bell/phone within reach;with chair alarm set   Nurse Communication Mobility status        Time: 9172-9145 OT Time Calculation (min): 27 min  Charges: OT General Charges $OT Visit: 1 Visit OT Treatments $Self Care/Home Management : 23-37 mins  Dick Laine, OTA Acute Rehabilitation Services  Office [phone]   Jeb LITTIE Laine 09/06/2024, 12:37 PM

## 2024-09-07 DIAGNOSIS — N1831 Chronic kidney disease, stage 3a: Secondary | ICD-10-CM | POA: Diagnosis not present

## 2024-09-07 DIAGNOSIS — E785 Hyperlipidemia, unspecified: Secondary | ICD-10-CM | POA: Diagnosis not present

## 2024-09-07 DIAGNOSIS — D649 Anemia, unspecified: Secondary | ICD-10-CM | POA: Diagnosis not present

## 2024-09-07 DIAGNOSIS — I10 Essential (primary) hypertension: Secondary | ICD-10-CM | POA: Diagnosis not present

## 2024-09-07 MED ORDER — ENOXAPARIN SODIUM 40 MG/0.4ML IJ SOSY
40.0000 mg | PREFILLED_SYRINGE | INTRAMUSCULAR | Status: DC
Start: 2024-09-07 — End: 2024-09-10
  Administered 2024-09-07 – 2024-09-10 (×4): 40 mg via SUBCUTANEOUS
  Filled 2024-09-07 (×4): qty 0.4

## 2024-09-07 MED ORDER — PANTOPRAZOLE SODIUM 40 MG PO TBEC
40.0000 mg | DELAYED_RELEASE_TABLET | Freq: Two times a day (BID) | ORAL | Status: DC
  Administered 2024-09-07 – 2024-09-10 (×7): 40 mg via ORAL
  Filled 2024-09-07 (×7): qty 1

## 2024-09-07 NOTE — Plan of Care (Addendum)
  Problem: Education: Goal: Knowledge of General Education information will improve Description: Including pain rating scale, medication(s)/side effects and non-pharmacologic comfort measures Outcome: Progressing   Problem: Clinical Measurements: Goal: Respiratory complications will improve Outcome: Progressing Goal: Cardiovascular complication will be avoided Outcome: Progressing   Problem: Activity: Goal: Risk for activity intolerance will decrease Outcome: Progressing   Problem: Nutrition: Goal: Adequate nutrition will be maintained Outcome: Progressing   Problem: Coping: Goal: Level of anxiety will decrease Outcome: Progressing   Problem: Pain Managment: Goal: General experience of comfort will improve and/or be controlled Outcome: Progressing

## 2024-09-07 NOTE — Progress Notes (Addendum)
 PROGRESS NOTE        PATIENT DETAILS Name: Martin Mason Age: 68 y.o. Sex: male Date of Birth: 06/11/1956 Admit Date: 09/03/2024 Admitting Physician Brayton GORMAN Lye, MD ERE:Wztopw, Corrina, MD  Brief Summary: Patient is a 68 y.o.  male with history of HTN, HLD, HFmrEF, PAD, recent hospitalization for severe anemia requiring blood transfusion-endoscopy studies showed AVM-presented to the hospital with fall/weakness-found to have hemoglobin of 5.8.  Significant events: 9/9>> admit to [facility]  Significant studies: 9/9>> CT head: No acute intracranial process 9/9>> x-ray left hip: No fracture. 9/10>> RLE Doppler: No DVT.  Significant microbiology data: None  Procedures: None  Consults: None  Subjective: No major issues overnight-appears comfortable-no complaints-awaiting SNF bed..  Objective: Vitals: Blood pressure 138/83, pulse (!) 59, temperature 97.6 F (36.4 C), temperature source Oral, resp. rate 12, height 5' 8 (1.727 m), weight 55.2 kg, SpO2 97%.   Exam: Awake/alert Abdomen: Soft nontender nondistended Nonfocal exam.  Pertinent Labs/Radiology:    Latest Ref Rng & Units 09/04/2024    2:44 PM 09/03/2024    2:16 PM 09/03/2024   12:47 PM  CBC  WBC 4.0 - 10.5 K/uL 10.4   6.9   Hemoglobin 13.0 - 17.0 g/dL 89.8  7.1  5.8   Hematocrit 39.0 - 52.0 % 33.5  21.0  21.2   Platelets 150 - 400 K/uL 337   327     Lab Results  Component Value Date   NA 131 (L) 09/04/2024   K 3.0 (L) 09/04/2024   CL 98 09/04/2024   CO2 21 (L) 09/04/2024      Assessment/Plan: Mechanical fall/presyncope Probably in the setting of severe anemia Never lost consciousness Orthostatic vital signs 9/10 were negative. 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 Hb stable after a total of 2 units of PRBC-last transfused 9/10.  Also given 1 dose of IV iron  on 9/10 Discussed with [facility] team-on 9/11-per GI PA-C-Dr Shila has reviewed chart-recommends supportive care and iron  supplementation. No endoscopy recommended at this time.   CKD stage IIIb At baseline  Hypokalemia Continue to replete/recheck.  HFmrEF Euvolemic Will dose furosemide  as needed.  HLD Statin  HTN BP stable Lisinopril /HCTZ/amlodipine  remain on hold  RLE swelling Doppler negative.   Code status:   Code Status: Full Code   DVT Prophylaxis: enoxaparin  (LOVENOX ) injection 40 mg Start: 09/07/24 1130 Place TED hose Start: 09/05/24 1142 SCDs Start: 09/03/24 1747   Family Communication: None at bedside   Disposition Plan: Status is: Inpatient Remains inpatient appropriate because: Severity of anemia   Planned Discharge Destination:Skilled nursing facility   Diet: Diet Order             Diet Heart Fluid consistency: Thin  Diet effective now                     Antimicrobial agents: Anti-infectives (From admission, onward)    None        MEDICATIONS: Scheduled Meds:  atorvastatin   20 mg Oral Daily   cholecalciferol   400 Units Oral Daily   cyanocobalamin   1,000 mcg Subcutaneous Daily   enoxaparin  (LOVENOX ) injection  40 mg Subcutaneous Q24H  fluticasone   1 spray Each Nare Daily   nicotine   21 mg Transdermal Daily   pantoprazole   40 mg Oral BID   potassium chloride  SA  20 mEq Oral Daily   Continuous Infusions: PRN Meds:.acetaminophen  **OR** acetaminophen , albuterol , HYDROcodone -acetaminophen , methocarbamol , naLOXone  (NARCAN )  injection   I have personally reviewed following labs and imaging studies  LABORATORY DATA: CBC: Recent Labs  Lab 09/03/24 1247 09/03/24 1416 09/04/24 1444  WBC 6.9  --  10.4  HGB 5.8* 7.1* 10.1*  HCT 21.2* 21.0* 33.5*  MCV 65.4*  --  68.1*  PLT 327   --  337    Basic Metabolic Panel: Recent Labs  Lab 09/03/24 1247 09/03/24 1416 09/04/24 1444  NA 134* 136 131*  K 3.4* 3.5 3.0*  CL 102 102 98  CO2 20*  --  21*  GLUCOSE 85 84 99  BUN 23 22 14   CREATININE 1.53* 1.80* 1.33*  CALCIUM  9.0  --  8.9    GFR: Estimated Creatinine Clearance: 40.9 mL/min (A) (by C-G formula based on SCr of 1.33 mg/dL (H)).  Liver Function Tests: Recent Labs  Lab 09/04/24 1444  AST 29  ALT 20  ALKPHOS 70  BILITOT 1.4*  PROT 7.0  ALBUMIN  3.4*   No results for input(s): LIPASE, AMYLASE in the last 168 hours. No results for input(s): AMMONIA in the last 168 hours.  Coagulation Profile: No results for input(s): INR, PROTIME in the last 168 hours.  Cardiac Enzymes: No results for input(s): CKTOTAL, CKMB, CKMBINDEX, TROPONINI in the last 168 hours.  BNP (last 3 results) Recent Labs    01/17/24 1447  PROBNP 2,597*    Lipid Profile: No results for input(s): CHOL, HDL, LDLCALC, TRIG, CHOLHDL, LDLDIRECT in the last 72 hours.  Thyroid Function Tests: No results for input(s): TSH, T4TOTAL, FREET4, T3FREE, THYROIDAB in the last 72 hours.  Anemia Panel: No results for input(s): VITAMINB12, FOLATE, FERRITIN, TIBC, IRON , RETICCTPCT in the last 72 hours.   Urine analysis:    Component Value Date/Time   COLORURINE STRAW (A) 02/03/2024 1536   APPEARANCEUR CLEAR 02/03/2024 1536   LABSPEC 1.008 02/03/2024 1536   PHURINE 6.0 02/03/2024 1536   GLUCOSEU NEGATIVE 02/03/2024 1536   HGBUR NEGATIVE 02/03/2024 1536   BILIRUBINUR NEGATIVE 02/03/2024 1536   KETONESUR NEGATIVE 02/03/2024 1536   PROTEINUR NEGATIVE 02/03/2024 1536   UROBILINOGEN 1.0 06/14/2010 2004   NITRITE NEGATIVE 02/03/2024 1536   LEUKOCYTESUR NEGATIVE 02/03/2024 1536    Sepsis Labs: Lactic Acid, Venous No results found for: LATICACIDVEN  MICROBIOLOGY: No results found for this or any previous visit (from the past 240  hours).  RADIOLOGY STUDIES/RESULTS: No results found.    LOS: 4 days   Donalda Applebaum, MD  [facility]    To contact the attending provider between 7A-7P or the covering provider during after hours 7P-7A, please log into the web site [email or web address] and access using universal [place] password for that web site. If you do not have the password, please call the hospital operator.  09/07/2024, 10:37 AM

## 2024-09-08 DIAGNOSIS — N1831 Chronic kidney disease, stage 3a: Secondary | ICD-10-CM | POA: Diagnosis not present

## 2024-09-08 DIAGNOSIS — E785 Hyperlipidemia, unspecified: Secondary | ICD-10-CM | POA: Diagnosis not present

## 2024-09-08 DIAGNOSIS — D649 Anemia, unspecified: Secondary | ICD-10-CM | POA: Diagnosis not present

## 2024-09-08 DIAGNOSIS — I10 Essential (primary) hypertension: Secondary | ICD-10-CM | POA: Diagnosis not present

## 2024-09-08 LAB — CBC
HCT: 34.3 % — ABNORMAL LOW (ref 39.0–52.0)
Hemoglobin: 9.9 g/dL — ABNORMAL LOW (ref 13.0–17.0)
MCH: 21.1 pg — ABNORMAL LOW (ref 26.0–34.0)
MCHC: 28.9 g/dL — ABNORMAL LOW (ref 30.0–36.0)
MCV: 73 fL — ABNORMAL LOW (ref 80.0–100.0)
Platelets: 284 K/uL (ref 150–400)
RBC: 4.7 MIL/uL (ref 4.22–5.81)
RDW: 29.4 % — ABNORMAL HIGH (ref 11.5–15.5)
WBC: 8.4 K/uL (ref 4.0–10.5)
nRBC: 0 % (ref 0.0–0.2)

## 2024-09-08 LAB — BASIC METABOLIC PANEL WITH GFR
Anion gap: 13 (ref 5–15)
BUN: 10 mg/dL (ref 8–23)
CO2: 19 mmol/L — ABNORMAL LOW (ref 22–32)
Calcium: 8.9 mg/dL (ref 8.9–10.3)
Chloride: 104 mmol/L (ref 98–111)
Creatinine, Ser: 1.12 mg/dL (ref 0.61–1.24)
GFR, Estimated: >60 mL/min (ref >60)
Glucose, Bld: 99 mg/dL (ref 70–99)
Potassium: 3.7 mmol/L (ref 3.5–5.1)
Sodium: 136 mmol/L (ref 135–145)

## 2024-09-08 LAB — MAGNESIUM: Magnesium: 2 mg/dL (ref 1.7–2.4)

## 2024-09-08 NOTE — Plan of Care (Signed)

## 2024-09-08 NOTE — Progress Notes (Signed)
 PROGRESS NOTE        PATIENT DETAILS Name: Martin Mason Age: 68 y.o. Sex: male Date of Birth: 1956/06/18 Admit Date: 09/03/2024 Admitting Physician Brayton GORMAN Lye, MD ERE:Wztopw, Corrina, MD  Brief Summary: Patient is a 68 y.o.  male with history of HTN, HLD, HFmrEF, PAD, recent hospitalization for severe anemia requiring blood transfusion-endoscopy studies showed AVM-presented to the hospital with fall/weakness-found to have hemoglobin of 5.8.  Significant events: 9/9>> admit to [facility]  Significant studies: 9/9>> CT head: No acute intracranial process 9/9>> x-ray left hip: No fracture. 9/10>> RLE Doppler: No DVT.  Significant microbiology data: None  Procedures: None  Consults: None  Subjective: No major issues overnight-awaiting SNF bed.  Objective: Vitals: Blood pressure 127/73, pulse (!) 57, temperature 98.5 F (36.9 C), temperature source Tympanic, resp. rate 18, height 5' 8 (1.727 m), weight 55.2 kg, SpO2 100%.   Exam: Awake/alert Abdomen: Soft nontender nondistended Nonfocal exam.  Pertinent Labs/Radiology:    Latest Ref Rng & Units 09/08/2024    4:27 AM 09/04/2024    2:44 PM 09/03/2024    2:16 PM  CBC  WBC 4.0 - 10.5 K/uL 8.4  10.4    Hemoglobin 13.0 - 17.0 g/dL 9.9  89.8  7.1   Hematocrit 39.0 - 52.0 % 34.3  33.5  21.0   Platelets 150 - 400 K/uL 284  337      Lab Results  Component Value Date   NA 136 09/08/2024   K 3.7 09/08/2024   CL 104 09/08/2024   CO2 19 (L) 09/08/2024      Assessment/Plan: Mechanical fall/presyncope Probably in the setting of severe anemia Never lost consciousness Orthostatic vital signs 9/10 were negative. Continue PT/OT Recent echo with mildly reduced EF  Severe microcytic anemia Probably had recent occult GI bleeding-probably not noticed by patient-poor historian Iron  indices consistent with iron  deficient anemia Prior endoscopies studies with AVMs-which likely-the source of slow GI  bleeding. Hb stable after a total of 2 units of PRBC-last transfused 9/10.  Also given 1 dose of IV iron  on 9/10 Discussed with [facility] team-on 9/11-per GI PA-C-Dr Shila has reviewed chart-recommends supportive care and iron  supplementation. No endoscopy recommended at this time.   CKD stage IIIb At baseline  Hypokalemia Continue to replete/recheck.  HFmrEF Euvolemic Will dose furosemide  as needed.  HLD Statin  HTN BP stable Lisinopril /HCTZ/amlodipine  remain on hold  RLE swelling Doppler negative.   Code status:   Code Status: Full Code   DVT Prophylaxis: enoxaparin  (LOVENOX ) injection 40 mg Start: 09/07/24 1200 Place TED hose Start: 09/05/24 1142 SCDs Start: 09/03/24 1747   Family Communication: None at bedside   Disposition Plan: Status is: Inpatient Remains inpatient appropriate because: Severity of anemia   Planned Discharge Destination:Skilled nursing facility   Diet: Diet Order             Diet Heart Fluid consistency: Thin  Diet effective now                     Antimicrobial agents: Anti-infectives (From admission, onward)    None        MEDICATIONS: Scheduled Meds:  atorvastatin   20 mg Oral Daily   cholecalciferol   400 Units Oral Daily   cyanocobalamin   1,000 mcg Subcutaneous Daily   enoxaparin  (LOVENOX ) injection  40 mg Subcutaneous Q24H   fluticasone   1 spray Each Nare Daily   nicotine   21 mg Transdermal Daily   pantoprazole   40 mg Oral BID   potassium chloride  SA  20 mEq Oral Daily   Continuous Infusions: PRN Meds:.acetaminophen  **OR** acetaminophen , albuterol , HYDROcodone -acetaminophen , methocarbamol , naLOXone  (NARCAN )  injection   I have personally reviewed following labs and imaging studies  LABORATORY DATA: CBC: Recent Labs  Lab 09/03/24 1247 09/03/24 1416 09/04/24 1444 09/08/24 0427  WBC 6.9  --  10.4 8.4  HGB 5.8* 7.1* 10.1* 9.9*  HCT 21.2* 21.0* 33.5* 34.3*  MCV 65.4*  --  68.1* 73.0*  PLT 327  --   337 284    Basic Metabolic Panel: Recent Labs  Lab 09/03/24 1247 09/03/24 1416 09/04/24 1444 09/08/24 0427  NA 134* 136 131* 136  K 3.4* 3.5 3.0* 3.7  CL 102 102 98 104  CO2 20*  --  21* 19*  GLUCOSE 85 84 99 99  BUN 23 22 14 10   CREATININE 1.53* 1.80* 1.33* 1.12  CALCIUM  9.0  --  8.9 8.9  MG  --   --   --  2.0    GFR: Estimated Creatinine Clearance: 48.6 mL/min (by C-G formula based on SCr of 1.12 mg/dL).  Liver Function Tests: Recent Labs  Lab 09/04/24 1444  AST 29  ALT 20  ALKPHOS 70  BILITOT 1.4*  PROT 7.0  ALBUMIN  3.4*   No results for input(s): LIPASE, AMYLASE in the last 168 hours. No results for input(s): AMMONIA in the last 168 hours.  Coagulation Profile: No results for input(s): INR, PROTIME in the last 168 hours.  Cardiac Enzymes: No results for input(s): CKTOTAL, CKMB, CKMBINDEX, TROPONINI in the last 168 hours.  BNP (last 3 results) Recent Labs    01/17/24 1447  PROBNP 2,597*    Lipid Profile: No results for input(s): CHOL, HDL, LDLCALC, TRIG, CHOLHDL, LDLDIRECT in the last 72 hours.  Thyroid Function Tests: No results for input(s): TSH, T4TOTAL, FREET4, T3FREE, THYROIDAB in the last 72 hours.  Anemia Panel: No results for input(s): VITAMINB12, FOLATE, FERRITIN, TIBC, IRON , RETICCTPCT in the last 72 hours.   Urine analysis:    Component Value Date/Time   COLORURINE STRAW (A) 02/03/2024 1536   APPEARANCEUR CLEAR 02/03/2024 1536   LABSPEC 1.008 02/03/2024 1536   PHURINE 6.0 02/03/2024 1536   GLUCOSEU NEGATIVE 02/03/2024 1536   HGBUR NEGATIVE 02/03/2024 1536   BILIRUBINUR NEGATIVE 02/03/2024 1536   KETONESUR NEGATIVE 02/03/2024 1536   PROTEINUR NEGATIVE 02/03/2024 1536   UROBILINOGEN 1.0 06/14/2010 2004   NITRITE NEGATIVE 02/03/2024 1536   LEUKOCYTESUR NEGATIVE 02/03/2024 1536    Sepsis Labs: Lactic Acid, Venous No results found for: LATICACIDVEN  MICROBIOLOGY: No  results found for this or any previous visit (from the past 240 hours).  RADIOLOGY STUDIES/RESULTS: No results found.    LOS: 5 days   Donalda Applebaum, MD  [facility]    To contact the attending provider between 7A-7P or the covering provider during after hours 7P-7A, please log into the web site [email or web address] and access using universal [place] password for that web site. If you do not have the password, please call the hospital operator.  09/08/2024, 1:05 PM

## 2024-09-08 NOTE — Progress Notes (Signed)
 Mobility Specialist Progress Note:    09/08/24 1706  Mobility  Activity Ambulated with assistance  Level of Assistance Contact guard assist, steadying assist  Assistive Device Front wheel walker  Distance Ambulated (ft) 150 ft  Activity Response Tolerated well  Mobility Referral Yes  Mobility visit 1 Mobility  Mobility Specialist Start Time (ACUTE ONLY) 1315  Mobility Specialist Stop Time (ACUTE ONLY) 1330  Mobility Specialist Time Calculation (min) (ACUTE ONLY) 15 min   Received pt in bed having no complaints and agreeable to mobility. Pt was asymptomatic throughout ambulation and returned to room w/o fault. Left in bed w/ call bell in reach and all needs met.   Thersia Minder Mobility Specialist  Please contact vis Secure Chat or  Rehab Office [phone]

## 2024-09-08 NOTE — Plan of Care (Signed)
 Pt has rested quietly throughout the night with no distress noted. Alert and oriented but forgetful. On room air. Pt is not on tele monitor. Voids per urinal. Medicated for pain with relief noted. No signs of bleeding noted. No other complaints voiced.      Problem: Education: Goal: Knowledge of General Education information will improve Description: Including pain rating scale, medication(s)/side effects and non-pharmacologic comfort measures Outcome: Progressing   Problem: Clinical Measurements: Goal: Respiratory complications will improve Outcome: Progressing Goal: Cardiovascular complication will be avoided Outcome: Progressing   Problem: Activity: Goal: Risk for activity intolerance will decrease Outcome: Progressing   Problem: Nutrition: Goal: Adequate nutrition will be maintained Outcome: Progressing   Problem: Pain Managment: Goal: General experience of comfort will improve and/or be controlled Outcome: Progressing

## 2024-09-09 DIAGNOSIS — D649 Anemia, unspecified: Secondary | ICD-10-CM | POA: Diagnosis not present

## 2024-09-09 DIAGNOSIS — N1831 Chronic kidney disease, stage 3a: Secondary | ICD-10-CM | POA: Diagnosis not present

## 2024-09-09 DIAGNOSIS — E785 Hyperlipidemia, unspecified: Secondary | ICD-10-CM | POA: Diagnosis not present

## 2024-09-09 DIAGNOSIS — I10 Essential (primary) hypertension: Secondary | ICD-10-CM | POA: Diagnosis not present

## 2024-09-09 MED ORDER — FERROUS SULFATE 325 (65 FE) MG PO TABS
325.0000 mg | ORAL_TABLET | Freq: Two times a day (BID) | ORAL | Status: DC
  Administered 2024-09-09 – 2024-09-10 (×3): 325 mg via ORAL
  Filled 2024-09-09 (×3): qty 1

## 2024-09-09 MED ORDER — LISINOPRIL 20 MG PO TABS
20.0000 mg | ORAL_TABLET | Freq: Every day | ORAL | Status: DC
  Administered 2024-09-09 – 2024-09-10 (×2): 20 mg via ORAL
  Filled 2024-09-09 (×2): qty 1

## 2024-09-09 NOTE — TOC Progression Note (Addendum)
 Transition of Care [facility]) - Progression Note    Patient Details  Name: Martin Mason MRN: [id number] Date of Birth: [birth year]  [facility] CM/SW Contact  Inocente GORMAN Kindle, LCSW Phone Number: 09/09/2024, 9:00 AM  Clinical Narrative:    9:01 AM-[facility] still awaiting insurance authorization.   11:10 AM-[facility] received denial with request for Peer to Peer. CSW discussed with MD who requested CSW speak with patient. CSW met with patient to inform him. Patient stated he thinks he will do okay at home but would like MD to complete Peer to Peer to see if he can go to rehab.   CSW contacted [employer] [phone], Auth# [id number] and spoke with Destiny to schedule Peer to Peer with MD (cell # provided) Dr. Towana at 1:30pm [place] today. Updated Dr. Raenelle.   3:37 PM-Per MD, Peer to Peer was approved. CSW updated [facility] on discharge tomorrow once authorization details are received.    Expected Discharge Plan: Skilled Nursing Facility Barriers to Discharge: Insurance Authorization               Expected Discharge Plan and Services In-house Referral: Clinical Social Work   Post Acute Care Choice: Skilled Nursing Facility Living arrangements for the past 2 months: Single Family Home                                       Social Drivers of Health (SDOH) Interventions SDOH Screenings   Food Insecurity: No Food Insecurity (09/05/2024)  Housing: Low Risk  (09/05/2024)  Transportation Needs: No Transportation Needs (09/05/2024)  Utilities: Not At Risk (09/05/2024)  Alcohol Screen: Low Risk  (02/06/2023)  Depression (PHQ2-9): Low Risk  (07/02/2024)  Financial Resource Strain: Low Risk  (02/06/2023)  Physical Activity: Sufficiently Active (02/06/2023)  Social Connections: Patient Declined (09/05/2024)  Stress: No Stress Concern Present (02/06/2023)  Tobacco Use: High Risk (09/06/2024)    Readmission Risk Interventions    06/27/2024    1:22 PM  Readmission Risk  Prevention Plan  Transportation Screening Complete  PCP or Specialist Appt within 5-7 Days Complete  Home Care Screening Complete  Medication Review (RN CM) Referral to Pharmacy

## 2024-09-09 NOTE — Progress Notes (Signed)
 PROGRESS NOTE        PATIENT DETAILS Name: Martin Mason Age: 68 y.o. Sex: male Date of Birth: November 20, 1956 Admit Date: 09/03/2024 Admitting Physician Brayton GORMAN Lye, MD ERE:Wztopw, Corrina, MD  Brief Summary: Patient is a 68 y.o.  male with history of HTN, HLD, HFmrEF, PAD, recent hospitalization for severe anemia requiring blood transfusion-endoscopy studies showed AVM-presented to the hospital with fall/weakness-found to have hemoglobin of 5.8.  Significant events: 9/9>> admit to [facility]  Significant studies: 9/9>> CT head: No acute intracranial process 9/9>> x-ray left hip: No fracture. 9/10>> RLE Doppler: No DVT.  Significant microbiology data: None  Procedures: None  Consults: None  Subjective: Lying comfortably in bed-does not have any complaints-awaiting SNF bed.  Objective: Vitals: Blood pressure (!) 147/77, pulse 61, temperature 98.6 F (37 C), temperature source Oral, resp. rate 16, height 5' 8 (1.727 m), weight 55.2 kg, SpO2 95%.   Exam: Awake/alert Abdomen: Soft nontender nondistended Extremities: No edema Nonfocal exam  Pertinent Labs/Radiology:    Latest Ref Rng & Units 09/08/2024    4:27 AM 09/04/2024    2:44 PM 09/03/2024    2:16 PM  CBC  WBC 4.0 - 10.5 K/uL 8.4  10.4    Hemoglobin 13.0 - 17.0 g/dL 9.9  89.8  7.1   Hematocrit 39.0 - 52.0 % 34.3  33.5  21.0   Platelets 150 - 400 K/uL 284  337      Lab Results  Component Value Date   NA 136 09/08/2024   K 3.7 09/08/2024   CL 104 09/08/2024   CO2 19 (L) 09/08/2024      Assessment/Plan: Mechanical fall/presyncope Probably in the setting of severe anemia Never lost consciousness Orthostatic vital signs 9/10 were negative. 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 Hb stable after a total of 2 units of PRBC-last transfused 9/10.  Also given 1 dose of IV iron  on 9/10 Discussed with [facility] team-on 9/11-per GI PA-C-Dr Shila has reviewed chart-recommends supportive care and iron  supplementation. No endoscopy recommended at this time.   CKD stage IIIb At baseline  Hypokalemia Continue to replete/recheck.  HFmrEF Euvolemic Will dose furosemide  as needed.  HLD Statin  HTN BP creeping up Resume lisinopril  HCTZ/amlodipine  on hold-resume when able  RLE swelling Doppler negative.  History of EtOH use In remission No symptoms of EtOH withdrawal during this hospitalization   Code status:   Code Status: Full Code   DVT Prophylaxis: enoxaparin  (LOVENOX ) injection 40 mg Start: 09/07/24 1200 Place TED hose Start: 09/05/24 1142 SCDs Start: 09/03/24 1747   Family Communication: None at bedside   Disposition Plan: Status is: Inpatient Remains inpatient appropriate because: Severity of anemia   Planned Discharge Destination:Skilled nursing facility   Diet: Diet Order             Diet Heart Fluid consistency: Thin  Diet effective now                     Antimicrobial agents: Anti-infectives (From admission, onward)    None        MEDICATIONS: Scheduled Meds:  atorvastatin   20 mg Oral Daily   cholecalciferol   400 Units Oral Daily   enoxaparin  (LOVENOX ) injection  40 mg Subcutaneous Q24H   ferrous sulfate   325 mg Oral BID WC   fluticasone   1 spray Each Nare Daily   nicotine   21 mg Transdermal Daily   pantoprazole   40 mg Oral BID   potassium chloride  SA  20 mEq Oral Daily   Continuous Infusions: PRN Meds:.acetaminophen  **OR** acetaminophen , albuterol , HYDROcodone -acetaminophen , methocarbamol , naLOXone  (NARCAN )  injection   I have personally reviewed following labs and imaging studies  LABORATORY DATA: CBC: Recent Labs  Lab 09/03/24 1247 09/03/24 1416  09/04/24 1444 09/08/24 0427  WBC 6.9  --  10.4 8.4  HGB 5.8* 7.1* 10.1* 9.9*  HCT 21.2* 21.0* 33.5* 34.3*  MCV 65.4*  --  68.1* 73.0*  PLT 327  --  337 284    Basic Metabolic Panel: Recent Labs  Lab 09/03/24 1247 09/03/24 1416 09/04/24 1444 09/08/24 0427  NA 134* 136 131* 136  K 3.4* 3.5 3.0* 3.7  CL 102 102 98 104  CO2 20*  --  21* 19*  GLUCOSE 85 84 99 99  BUN 23 22 14 10   CREATININE 1.53* 1.80* 1.33* 1.12  CALCIUM  9.0  --  8.9 8.9  MG  --   --   --  2.0    GFR: Estimated Creatinine Clearance: 48.6 mL/min (by C-G formula based on SCr of 1.12 mg/dL).  Liver Function Tests: Recent Labs  Lab 09/04/24 1444  AST 29  ALT 20  ALKPHOS 70  BILITOT 1.4*  PROT 7.0  ALBUMIN  3.4*   No results for input(s): LIPASE, AMYLASE in the last 168 hours. No results for input(s): AMMONIA in the last 168 hours.  Coagulation Profile: No results for input(s): INR, PROTIME in the last 168 hours.  Cardiac Enzymes: No results for input(s): CKTOTAL, CKMB, CKMBINDEX, TROPONINI in the last 168 hours.  BNP (last 3 results) Recent Labs    01/17/24 1447  PROBNP 2,597*    Lipid Profile: No results for input(s): CHOL, HDL, LDLCALC, TRIG, CHOLHDL, LDLDIRECT in the last 72 hours.  Thyroid Function Tests: No results for input(s): TSH, T4TOTAL, FREET4, T3FREE, THYROIDAB in the last 72 hours.  Anemia Panel: No results for input(s): VITAMINB12, FOLATE, FERRITIN, TIBC, IRON , RETICCTPCT in the last 72 hours.   Urine analysis:    Component Value Date/Time   COLORURINE STRAW (A) 02/03/2024 1536   APPEARANCEUR CLEAR 02/03/2024 1536   LABSPEC 1.008 02/03/2024 1536   PHURINE 6.0 02/03/2024 1536   GLUCOSEU NEGATIVE 02/03/2024 1536   HGBUR NEGATIVE 02/03/2024 1536   BILIRUBINUR NEGATIVE 02/03/2024 1536   KETONESUR NEGATIVE 02/03/2024 1536   PROTEINUR NEGATIVE 02/03/2024 1536   UROBILINOGEN 1.0 06/14/2010 2004   NITRITE NEGATIVE  02/03/2024 1536   LEUKOCYTESUR NEGATIVE 02/03/2024 1536    Sepsis Labs: Lactic Acid, Venous No results found for: LATICACIDVEN  MICROBIOLOGY: No results found for this or any previous visit (from the past 240 hours).  RADIOLOGY STUDIES/RESULTS: No results found.    LOS: 6 days   Donalda Applebaum, MD  [facility]    To contact the attending provider between 7A-7P or the covering provider during after hours 7P-7A, please log into the web site [email or web address] and access using universal [place] password for that web site. If you do not have the password, please call the hospital operator.  09/09/2024, 10:31 AM

## 2024-09-09 NOTE — Plan of Care (Signed)

## 2024-09-09 NOTE — Progress Notes (Signed)
 Occupational Therapy Treatment Patient Details Name: Martin Mason MRN: [id number] DOB: 11/20/1956 Today's Date: 09/09/2024   History of present illness Pt is a 68 y.o. M who presents 9/9 with complaints of syncopal episode versus seizure. ED his workup significant for potassium 3.4, sodium 134, CO2 of 20, creatinine at baseline 1.53, CT head with no acute findings, he was noted to have hemoglobin of 5.8. Recent hospitalization July 2025 significant for anemia. PMH: hypertension, hyperlipidemia, PAD, chronic HFmrEF, iron  deficiency anemia, and alcohol abuse in remission.   OT comments  Pt is making good progress towards their acute OT goals. He remains limited by weakness and safety and needed encouragement to participate. Overall he completed bed mobility and tranfers with supervision A for safety. He was able to reach his feet and toilet without physical assist, cues needed for safety. OT to continue to follow acutely to facilitate progress towards established goals. Pt will continue to benefit from skilled inpatient follow up therapy, <3 hours/day due to lack of physical support from family and weakness.       If plan is discharge home, recommend the following:  A lot of help with walking and/or transfers;A lot of help with bathing/dressing/bathroom;Assistance with cooking/housework;Help with stairs or ramp for entrance;Assist for transportation   Equipment Recommendations  None recommended by OT       Precautions / Restrictions Precautions Precautions: Fall;Other (comment) Restrictions Weight Bearing Restrictions Per Provider Order: No       Mobility Bed Mobility Overal bed mobility: Needs Assistance Bed Mobility: Supine to Sit, Sit to Supine     Supine to sit: Supervision Sit to supine: Supervision        Transfers Overall transfer level: Needs assistance Equipment used: 1 person hand held assist Transfers: Sit to/from Stand Sit to Stand: Supervision                  Balance Overall balance assessment: Needs assistance Sitting-balance support: Feet supported Sitting balance-Leahy Scale: Fair     Standing balance support: During functional activity, Single extremity supported Standing balance-Leahy Scale: Fair                             ADL either performed or assessed with clinical judgement   ADL Overall ADL's : Needs assistance/impaired                         Toilet Transfer: Radiographer, therapeutic Details (indicate cue type and reason): simulated as pt preferred to use the urinal at bed level at the start of the session         Functional mobility during ADLs: Supervision/safety General ADL Comments: pt has improved ability to complete ADLs, he continues to be limited by weakness and poor insight to safety    Extremity/Trunk Assessment Upper Extremity Assessment Upper Extremity Assessment: Generalized weakness   Lower Extremity Assessment Lower Extremity Assessment: Defer to PT evaluation        Vision   Vision Assessment?: No apparent visual deficits   Perception Perception Perception: Not tested   Praxis Praxis Praxis: Not tested   Communication Communication Communication: Impaired Factors Affecting Communication: Hearing impaired   Cognition Arousal: Alert Behavior During Therapy: Flat affect, WFL for tasks assessed/performed Cognition: No family/caregiver present to determine baseline             OT - Cognition Comments: likely near baseline, needed encouragement to participate. limited insight to  safety                 Following commands: Impaired        Cueing   Cueing Techniques: Verbal cues        General Comments VSS    Pertinent Vitals/ Pain       Pain Assessment Pain Assessment: Faces Faces Pain Scale: Hurts a little bit Pain Location: generalized Pain Descriptors / Indicators: Discomfort, Guarding Pain Intervention(s): Limited activity within  patient's tolerance, Monitored during session  Home Living Family/patient expects to be discharged to:: Private residence Living Arrangements: Other relatives Available Help at Discharge: Family;Available 24 hours/day Type of Home: House Home Access: Stairs to enter [employer] of Steps: 3 Entrance Stairs-Rails: Right Home Layout: One level     Bathroom Shower/Tub: Chief Strategy Officer: Standard                    Frequency  Min 2X/week        Progress Toward Goals  OT Goals(current goals can now be found in the care plan section)  Progress towards OT goals: Progressing toward goals  Acute Rehab OT Goals Patient Stated Goal: to get stronger OT Goal Formulation: With patient Time For Goal Achievement: 09/18/24 Potential to Achieve Goals: Good ADL Goals Pt Will Perform Grooming: with modified independence Pt Will Perform Upper Body Dressing: with modified independence Pt Will Perform Lower Body Dressing: with contact guard assist;sit to/from stand Pt Will Transfer to Toilet: with supervision;ambulating;regular height toilet Pt Will Perform Toileting - Clothing Manipulation and hygiene: with modified independence;sitting/lateral leans;sit to/from stand  Plan         AM-PAC OT 6 Clicks Daily Activity     Outcome Measure   Help from another person eating meals?: None Help from another person taking care of personal grooming?: A Little Help from another person toileting, which includes using toliet, bedpan, or urinal?: A Little Help from another person bathing (including washing, rinsing, drying)?: A Little Help from another person to put on and taking off regular upper body clothing?: A Little Help from another person to put on and taking off regular lower body clothing?: A Little 6 Click Score: 19    End of Session Equipment Utilized During Treatment: Gait belt  OT Visit Diagnosis: Unsteadiness on feet (R26.81);Other abnormalities  of gait and mobility (R26.89);Muscle weakness (generalized) (M62.81);Pain Pain - part of body: Leg   Activity Tolerance Patient tolerated treatment well   Patient Left in chair;with call bell/phone within reach;with chair alarm set   Nurse Communication Mobility status        Time: 8579-8561 OT Time Calculation (min): 18 min  Charges: OT General Charges $OT Visit: 1 Visit OT Treatments $Self Care/Home Management : 8-22 mins  Lucie Kendall, OTR/L Acute Rehabilitation Services Office [phone] Secure Chat Communication Preferred   Lucie JONETTA Kendall 09/09/2024, 2:48 PM

## 2024-09-09 NOTE — Plan of Care (Signed)

## 2024-09-09 NOTE — Progress Notes (Signed)
 Physical Therapy Treatment Patient Details Name: Martin Mason MRN: [id number] DOB: 1956/06/11 Today's Date: 09/09/2024   History of Present Illness Pt is a 68 y.o. M who presents 9/9 with complaints of syncopal episode versus seizure. ED his workup significant for potassium 3.4, sodium 134, CO2 of 20, creatinine at baseline 1.53, CT head with no acute findings, he was noted to have hemoglobin of 5.8. Recent hospitalization July 2025 significant for anemia. PMH: hypertension, hyperlipidemia, PAD, chronic HFmrEF, iron  deficiency anemia, and alcohol abuse in remission.    PT Comments  Pt making good progress with mobility. Has history of falls at home. Patient will benefit from continued inpatient follow up therapy, <3 hours/day to maximize his independence and reduce fall risk.     If plan is discharge home, recommend the following: A little help with walking and/or transfers;A little help with bathing/dressing/bathroom;Assistance with cooking/housework;Direct supervision/assist for medications management;Direct supervision/assist for financial management;Assist for transportation;Help with stairs or ramp for entrance;Supervision due to cognitive status   Can travel by private vehicle     Yes  Equipment Recommendations  Rolling walker (2 wheels);BSC/3in1    Recommendations for Other Services       Precautions / Restrictions Precautions Precautions: Fall;Other (comment) Precaution/Restrictions Comments: watch BP Restrictions Weight Bearing Restrictions Per Provider Order: No     Mobility  Bed Mobility Overal bed mobility: Modified Independent Bed Mobility: Supine to Sit, Sit to Supine     Supine to sit: Modified independent (Device/Increase time), HOB elevated Sit to supine: Modified independent (Device/Increase time), HOB elevated        Transfers Overall transfer level: Needs assistance Equipment used: Rolling walker (2 wheels), None Transfers: Sit to/from Stand Sit to  Stand: Supervision                Ambulation/Gait Ambulation/Gait assistance: Supervision, Contact guard assist Gait Distance (Feet): 150 Feet (x 2) Assistive device: Rolling walker (2 wheels), None Gait Pattern/deviations: Step-through pattern, Decreased step length - right, Decreased step length - left, Decreased stride length, Trunk flexed, Narrow base of support Gait velocity: decr Gait velocity interpretation: <1.8 ft/sec, indicate of risk for recurrent falls   General Gait Details: Supervision when using walker and CGA without assistive device   Stairs             Wheelchair Mobility     Tilt Bed    Modified Rankin (Stroke Patients Only)       Balance Overall balance assessment: Needs assistance Sitting-balance support: Feet supported Sitting balance-Leahy Scale: Fair     Standing balance support: No upper extremity supported, During functional activity Standing balance-Leahy Scale: Fair                              Hotel manager: Impaired Factors Affecting Communication: Hearing impaired  Cognition Arousal: Alert Behavior During Therapy: WFL for tasks assessed/performed, Flat affect   PT - Cognitive impairments: No family/caregiver present to determine baseline                         Following commands: Impaired Following commands impaired: Follows multi-step commands with increased time    Cueing Cueing Techniques: Verbal cues  Exercises      General Comments        Pertinent Vitals/Pain Pain Assessment Pain Assessment: No/denies pain    Home Living Family/patient expects to be discharged to:: Private residence Living Arrangements: Other relatives Available Help  at Discharge: Family;Available 24 hours/day Type of Home: House Home Access: Stairs to enter Entrance Stairs-Rails: Right Entrance Stairs-Number of Steps: 3   Home Layout: One level        Prior Function             PT Goals (current goals can now be found in the care plan section) Acute Rehab PT Goals Patient Stated Goal: to improve and go home Progress towards PT goals: Progressing toward goals    Frequency    Min 3X/week      PT Plan      Co-evaluation              AM-PAC PT 6 Clicks Mobility   Outcome Measure  Help needed turning from your back to your side while in a flat bed without using bedrails?: None Help needed moving from lying on your back to sitting on the side of a flat bed without using bedrails?: None Help needed moving to and from a bed to a chair (including a wheelchair)?: A Little Help needed standing up from a chair using your arms (e.g., wheelchair or bedside chair)?: A Little Help needed to walk in hospital room?: A Little Help needed climbing 3-5 steps with a railing? : A Little 6 Click Score: 20    End of Session Equipment Utilized During Treatment: Gait belt Activity Tolerance: Patient tolerated treatment well Patient left: with call bell/phone within reach;in bed;with bed alarm set   PT Visit Diagnosis: Unsteadiness on feet (R26.81);Other abnormalities of gait and mobility (R26.89);Muscle weakness (generalized) (M62.81);Difficulty in walking, not elsewhere classified (R26.2);History of falling (Z91.81)     Time: 8755-8740 PT Time Calculation (min) (ACUTE ONLY): 15 min  Charges:    $Gait Training: 8-22 mins PT General Charges $$ ACUTE PT VISIT: 1 Visit                     [facility] PT Acute Rehabilitation Services Office [phone]    Rodgers ORN [facility] 09/09/2024, 2:41 PM

## 2024-09-09 NOTE — Care Management Important Message (Signed)
 Important Message  Patient Details  Name: Martin Mason MRN: [id number] Date of Birth: [birth year]   Important Message Given:  Yes - Medicare IM     Claretta Deed 09/09/2024, 4:04 PM

## 2024-09-10 DIAGNOSIS — R41841 Cognitive communication deficit: Secondary | ICD-10-CM | POA: Diagnosis not present

## 2024-09-10 DIAGNOSIS — I10 Essential (primary) hypertension: Secondary | ICD-10-CM | POA: Diagnosis not present

## 2024-09-10 DIAGNOSIS — R278 Other lack of coordination: Secondary | ICD-10-CM | POA: Diagnosis not present

## 2024-09-10 DIAGNOSIS — N1832 Chronic kidney disease, stage 3b: Secondary | ICD-10-CM | POA: Diagnosis not present

## 2024-09-10 DIAGNOSIS — Z9181 History of falling: Secondary | ICD-10-CM | POA: Diagnosis not present

## 2024-09-10 DIAGNOSIS — I70219 Atherosclerosis of native arteries of extremities with intermittent claudication, unspecified extremity: Secondary | ICD-10-CM | POA: Diagnosis not present

## 2024-09-10 DIAGNOSIS — N528 Other male erectile dysfunction: Secondary | ICD-10-CM | POA: Diagnosis not present

## 2024-09-10 DIAGNOSIS — R531 Weakness: Secondary | ICD-10-CM | POA: Diagnosis not present

## 2024-09-10 DIAGNOSIS — I5023 Acute on chronic systolic (congestive) heart failure: Secondary | ICD-10-CM | POA: Diagnosis not present

## 2024-09-10 DIAGNOSIS — R1310 Dysphagia, unspecified: Secondary | ICD-10-CM | POA: Diagnosis not present

## 2024-09-10 DIAGNOSIS — K219 Gastro-esophageal reflux disease without esophagitis: Secondary | ICD-10-CM | POA: Diagnosis not present

## 2024-09-10 DIAGNOSIS — M6281 Muscle weakness (generalized): Secondary | ICD-10-CM | POA: Diagnosis not present

## 2024-09-10 DIAGNOSIS — F1020 Alcohol dependence, uncomplicated: Secondary | ICD-10-CM | POA: Diagnosis not present

## 2024-09-10 DIAGNOSIS — Z7401 Bed confinement status: Secondary | ICD-10-CM | POA: Diagnosis not present

## 2024-09-10 DIAGNOSIS — D649 Anemia, unspecified: Secondary | ICD-10-CM | POA: Diagnosis not present

## 2024-09-10 DIAGNOSIS — E876 Hypokalemia: Secondary | ICD-10-CM | POA: Diagnosis not present

## 2024-09-10 DIAGNOSIS — E785 Hyperlipidemia, unspecified: Secondary | ICD-10-CM | POA: Diagnosis not present

## 2024-09-10 DIAGNOSIS — N1831 Chronic kidney disease, stage 3a: Secondary | ICD-10-CM | POA: Diagnosis not present

## 2024-09-10 DIAGNOSIS — Z741 Need for assistance with personal care: Secondary | ICD-10-CM | POA: Diagnosis not present

## 2024-09-10 MED ORDER — METHOCARBAMOL 500 MG PO TABS: 500.0000 mg | ORAL_TABLET | Freq: Three times a day (TID) | ORAL | Status: AC | PRN

## 2024-09-10 MED ORDER — ALBUTEROL SULFATE (2.5 MG/3ML) 0.083% IN NEBU: 2.5000 mg | INHALATION_SOLUTION | RESPIRATORY_TRACT | Status: AC | PRN

## 2024-09-10 MED ORDER — AMLODIPINE BESYLATE 5 MG PO TABS
5.0000 mg | ORAL_TABLET | Freq: Every day | ORAL | Status: DC
  Administered 2024-09-10: 5 mg via ORAL
  Filled 2024-09-10: qty 1

## 2024-09-10 MED ORDER — NICOTINE 21 MG/24HR TD PT24: 21.0000 mg | MEDICATED_PATCH | Freq: Every day | TRANSDERMAL | Status: AC

## 2024-09-10 MED ORDER — AMLODIPINE BESYLATE 5 MG PO TABS: 5.0000 mg | ORAL_TABLET | Freq: Every day | ORAL | Status: DC

## 2024-09-10 MED ORDER — LISINOPRIL 20 MG PO TABS: 20.0000 mg | ORAL_TABLET | Freq: Every day | ORAL | Status: DC

## 2024-09-10 NOTE — TOC Progression Note (Addendum)
 Transition of Care [facility]) - Progression Note    Patient Details  Name: Martin Mason MRN: [id number] Date of Birth: May 20, 1956  [facility] CM/SW Contact  Inocente GORMAN Kindle, LCSW Phone Number: 09/10/2024, 8:56 AM  Clinical Narrative:    8:56 AM-Awaiting confirmation that [facility] has received insurance approval.   12:46 PM-[facility] has received insurance approval. CSW updated patient who provided verbal consent to contact his brothers. CSW spoke with [name] [phone]) who asked Beryl to write down the address for [facility].  CSW will arrange PTAR for transport.    Expected Discharge Plan: Skilled Nursing Facility Barriers to Discharge: Insurance Authorization               Expected Discharge Plan and Services In-house Referral: Clinical Social Work   Post Acute Care Choice: Skilled Nursing Facility Living arrangements for the past 2 months: Single Family Home Expected Discharge Date: 09/10/24                                     Social Drivers of Health (SDOH) Interventions SDOH Screenings   Food Insecurity: No Food Insecurity (09/05/2024)  Housing: Low Risk  (09/05/2024)  Transportation Needs: No Transportation Needs (09/05/2024)  Utilities: Not At Risk (09/05/2024)  Alcohol Screen: Low Risk  (02/06/2023)  Depression (PHQ2-9): Low Risk  (07/02/2024)  Financial Resource Strain: Low Risk  (02/06/2023)  Physical Activity: Sufficiently Active (02/06/2023)  Social Connections: Patient Declined (09/05/2024)  Stress: No Stress Concern Present (02/06/2023)  Tobacco Use: High Risk (09/06/2024)    Readmission Risk Interventions    06/27/2024    1:22 PM  Readmission Risk Prevention Plan  Transportation Screening Complete  PCP or Specialist Appt within 5-7 Days Complete  Home Care Screening Complete  Medication Review (RN CM) Referral to Pharmacy

## 2024-09-10 NOTE — Discharge Summary (Signed)
 PATIENT DETAILS Name: Martin Mason Age: 68 y.o. Sex: male Date of Birth: [birth year] MRN: [id number]. Admitting Physician: Brayton GORMAN Lye, MD ERE:Wztopw, Corrina, MD  Admit Date: 09/03/2024 Discharge date: 09/10/2024  Recommendations for Outpatient Follow-up:  Follow up with PCP in 1-2 weeks Please obtain CMP/CBC in one week Iron  panel/iron  indices in 4 to 6 weeks.  Admitted From:  Home  Disposition: Skilled nursing facility   Discharge Condition: good  CODE STATUS:   Code Status: Full Code   Diet recommendation:  Diet Order             Diet - low sodium heart healthy           Diet Heart Fluid consistency: Thin  Diet effective now                    Brief Summary: Patient is a 68 y.o.  male with history of HTN, HLD, HFmrEF, PAD, recent hospitalization for severe anemia requiring blood transfusion-endoscopy studies showed AVM-presented to the hospital with fall/weakness-found to have hemoglobin of 5.8.   Significant events: 9/9>> admit to [facility]   Significant studies: 9/9>> CT head: No acute intracranial process 9/9>> x-ray left hip: No fracture. 9/10>> RLE Doppler: No DVT.   Significant microbiology data: None   Procedures: None   Consults: None  Brief Hospital Course: Mechanical fall/presyncope Probably in the setting of severe anemia Never lost consciousness Orthostatic vital signs 9/10 were negative. Continue PT/OT Recent echo with mildly reduced EF   Severe microcytic anemia Probably had recent occult GI bleeding-probably not noticed by patient-poor historian Iron  indices consistent with iron  deficient anemia Prior endoscopies studies with AVMs-which likely-the source of slow GI bleeding. Hb stable after a total of 2 units of PRBC-last transfused 9/10.  Also given 1 dose of IV iron  on 9/10 Discussed with [place] [facility] team-on 9/11-per GI PA-C-Dr Shila has reviewed chart-recommends supportive care and iron  supplementation. No endoscopy  recommended at this time.    CKD stage IIIb At baseline   Hypokalemia Continue to replete/recheck.   HFmrEF Euvolemic Will dose furosemide  as needed.   HLD Statin   HTN BP still creeping up-resume amlodipine  on discharge, continue with lisinopril  Follow closely and resume HCTZ when able.  RLE swelling Doppler negative.   History of EtOH use In remission No symptoms of EtOH withdrawal during this hospitalization   Discharge Diagnoses:  Principal Problem:   Symptomatic anemia Active Problems:   Essential hypertension   Tobacco abuse   CKD (chronic kidney disease), stage IIIa (HCC)   Hyperlipidemia   Discharge Instructions:  Activity:  As tolerated  Discharge Instructions     Diet - low sodium heart healthy   Complete by: As directed    Discharge instructions   Complete by: As directed    Follow with Primary MD  Delbert Corrina, MD in 1-2 weeks  Please get a complete blood count and chemistry panel checked by your Primary MD at your next visit, and again as instructed by your Primary MD.  Get Medicines reviewed and adjusted: Please take all your medications with you for your next visit with your Primary MD  Laboratory/radiological data: Please request your Primary MD to go over all hospital tests and procedure/radiological results at the follow up, please ask your Primary MD to get all Hospital records sent to his/her office.  In some cases, they will be blood work, cultures and biopsy results pending at the time of your discharge. Please request that your primary  care M.D. follows up on these results.  Also Note the following: If you experience worsening of your admission symptoms, develop shortness of breath, life threatening emergency, suicidal or homicidal thoughts you must seek medical attention immediately by calling 911 or calling your MD immediately  if symptoms less severe.  You must read complete instructions/literature along with all the possible  adverse reactions/side effects for all the Medicines you take and that have been prescribed to you. Take any new Medicines after you have completely understood and accpet all the possible adverse reactions/side effects.   Do not drive when taking Pain medications or sleeping medications (Benzodaizepines)  Do not take more than prescribed Pain, Sleep and Anxiety Medications. It is not advisable to combine anxiety,sleep and pain medications without talking with your primary care practitioner  Special Instructions: If you have smoked or chewed Tobacco  in the last 2 yrs please stop smoking, stop any regular Alcohol  and or any Recreational drug use.  Wear Seat belts while driving.  Please note: You were cared for by a hospitalist during your hospital stay. Once you are discharged, your primary care physician will handle any further medical issues. Please note that NO REFILLS for any discharge medications will be authorized once you are discharged, as it is imperative that you return to your primary care physician (or establish a relationship with a primary care physician if you do not have one) for your post hospital discharge needs so that they can reassess your need for medications and monitor your lab values.   Increase activity slowly   Complete by: As directed       Allergies as of 09/10/2024   No Known Allergies      Medication List     STOP taking these medications    furosemide  20 MG tablet Commonly known as: LASIX    lisinopril -hydrochlorothiazide  20-25 MG tablet Commonly known as: ZESTORETIC    potassium chloride  SA 20 MEQ tablet Commonly known as: KLOR-CON  M       TAKE these medications    albuterol  (2.5 MG/3ML) 0.083% nebulizer solution Commonly known as: PROVENTIL  Take 3 mLs (2.5 mg total) by nebulization every 2 (two) hours as needed for wheezing.   amLODipine  5 MG tablet Commonly known as: NORVASC  Take 1 tablet (5 mg total) by mouth daily. What changed:   medication strength how much to take   atorvastatin  20 MG tablet Commonly known as: LIPITOR Take 1 tablet (20 mg total) by mouth daily.   CALCIUM  PO Take 1 tablet by mouth daily. Unknown formulation/strength   cetirizine  10 MG tablet Commonly known as: ZYRTEC  Take 1 tablet (10 mg total) by mouth daily.   cholecalciferol  10 MCG (400 UNIT) Tabs tablet Commonly known as: VITAMIN D3 Take 1 tablet (400 Units total) by mouth daily.   ferrous sulfate  325 (65 FE) MG tablet Take 1 tablet (325 mg total) by mouth 2 (two) times daily with a meal.   fluticasone  50 MCG/ACT nasal spray Commonly known as: FLONASE  Use 2 spray(s) in each nostril once daily   lisinopril  20 MG tablet Commonly known as: ZESTRIL  Take 1 tablet (20 mg total) by mouth daily.   methocarbamol  500 MG tablet Commonly known as: ROBAXIN  Take 1 tablet (500 mg total) by mouth every 8 (eight) hours as needed for muscle spasms.   nicotine  21 mg/24hr patch Commonly known as: NICODERM CQ  - dosed in mg/24 hours Place 1 patch (21 mg total) onto the skin daily.   pantoprazole  40 MG tablet  Commonly known as: PROTONIX  Take 1 tablet (40 mg total) by mouth 2 (two) times daily before a meal. Protonix  BID x 10 weeks and then once daily   sildenafil  50 MG tablet Commonly known as: VIAGRA  TAKE 1 TABLET BY MOUTH ONCE DAILY AS NEEDED FOR ERECTILE DYSFUNCTION AT  LEAST  24  HOURS  BETWEEN  DOSES        Contact information for follow-up providers     Delbert Clam, MD. Schedule an appointment as soon as possible for a visit in 1 week(s).   Specialty: Family Medicine Contact information: [address] [place] [address] [place] [address] [phone]              Contact information for after-discharge care     Destination     [facility], [place] .   Service: Skilled Nursing Contact information: [address] [place]  [phone]                    No  Known Allergies   Other Procedures/Studies: VAS US  LOWER EXTREMITY VENOUS (DVT) Result Date: 09/04/2024  Lower Venous DVT Study Patient Name:  ANHAD SHEELEY  Date of Exam:   09/04/2024 Medical Rec #: [id number]    Accession #:    [id number] Date of Birth: Jul 05, 1956   Patient Gender: M Patient Age:   37 years Exam Location:  [facility] Procedure:      VAS US  LOWER EXTREMITY VENOUS (DVT) Referring Phys: DAWOOD ELGERGAWY --------------------------------------------------------------------------------  Indications: Edema.  Limitations: Pt has bedbugs. Performing Technologist: Elmarie Lindau, RVT  Examination Guidelines: A complete evaluation includes B-mode imaging, spectral Doppler, color Doppler, and power Doppler as needed of all accessible portions of each vessel. Bilateral testing is considered an integral part of a complete examination. Limited examinations for reoccurring indications may be performed as noted. The reflux portion of the exam is performed with the patient in reverse Trendelenburg.  +---------+---------------+---------+-----------+----------+--------------+ RIGHT    CompressibilityPhasicitySpontaneityPropertiesThrombus Aging +---------+---------------+---------+-----------+----------+--------------+ CFV      Full           Yes      Yes                                 +---------+---------------+---------+-----------+----------+--------------+ SFJ      Full                                                        +---------+---------------+---------+-----------+----------+--------------+ FV Prox  Full                                                        +---------+---------------+---------+-----------+----------+--------------+ FV Mid   Full                                                        +---------+---------------+---------+-----------+----------+--------------+ FV DistalFull                                                         +---------+---------------+---------+-----------+----------+--------------+  PFV      Full                                                        +---------+---------------+---------+-----------+----------+--------------+ POP      Full           Yes      Yes                                 +---------+---------------+---------+-----------+----------+--------------+ PTV      Full                                                        +---------+---------------+---------+-----------+----------+--------------+ PERO     Full                                                        +---------+---------------+---------+-----------+----------+--------------+ Incidental finding: There appears to be an occluded mid superficial femoral artery that reconstitutes at distal superficial femoral artery.  Right Technical Findings: Pt had abnormal ABI on 02/13/2024    Summary: RIGHT: - There is no evidence of deep vein thrombosis in the lower extremity.  - No cystic structure found in the popliteal fossa. - Occluded right mid superficial femoral artery.   *See table(s) above for measurements and observations. Electronically signed by Lonni Gaskins MD on 09/04/2024 at 12:11:13 PM.    Final    EEG adult Result Date: 09/04/2024 Shelton Arlin KIDD, MD     09/04/2024  8:33 AM Patient Name: Burnice Oestreicher MRN: [id number] Epilepsy Attending: Arlin KIDD Shelton Referring Physician/Provider: Sherlon Brayton RAMAN, MD Date: 09/03/2024 Duration: 36.58 mins Patient history: 68yo M with syncope. EEG to evaluate for seizure Level of alertness: Awake AEDs during EEG study: None Technical aspects: This EEG study was done with scalp electrodes positioned according to the 10-20 International system of electrode placement. Electrical activity was reviewed with band pass filter of 1-70Hz , sensitivity of 7 uV/mm, display speed of 39mm/sec with a 60Hz  notched filter applied as appropriate. EEG data were recorded continuously and  digitally stored.  Video monitoring was available and reviewed as appropriate. Description: The posterior dominant rhythm consists of 7Hz  activity of moderate voltage (25-35 uV) seen predominantly in posterior head regions, symmetric and reactive to eye opening and eye closing. Hyperventilation and photic stimulation were not performed.   ABNORMALITY - Background slow IMPRESSION: This study is suggestive of mild diffuse encephalopathy. No seizures or epileptiform discharges were seen throughout the recording. Priyanka O Yadav   DG Hip Unilat W or Wo Pelvis 2-3 Views Left Result Date: 09/03/2024 CLINICAL DATA:  Status post fall. EXAM: DG HIP (WITH OR WITHOUT PELVIS) 2-3V LEFT COMPARISON:  None Available. FINDINGS: There is no evidence of an acute hip fracture or dislocation. Mild degenerative changes are seen involving both hips in the form of joint space narrowing and acetabular sclerosis. Marked severity vascular calcification is seen. IMPRESSION: Mild degenerative changes without evidence of  an acute fracture or dislocation. Electronically Signed   By: Suzen Dials M.D.   On: 09/03/2024 16:11   CT Head Wo Contrast Result Date: 09/03/2024 CLINICAL DATA:  New onset seizure EXAM: CT HEAD WITHOUT CONTRAST TECHNIQUE: Contiguous axial images were obtained from the base of the skull through the vertex without intravenous contrast. RADIATION DOSE REDUCTION: This exam was performed according to the departmental dose-optimization program which includes automated exposure control, adjustment of the mA and/or kV according to patient size and/or use of iterative reconstruction technique. COMPARISON:  01/06/2012 FINDINGS: Brain: No acute infarct or hemorrhage. Lateral ventricles and midline structures are unremarkable. No acute extra-axial fluid collections. No mass effect. Vascular: No hyperdense vessel or unexpected calcification. Skull: Normal. Negative for fracture or focal lesion. Sinuses/Orbits: Mild mucosal  thickening and retained secretions within the right sphenoid sinus. 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 Other: None. IMPRESSION: 1. No acute intracranial process. 2. Minimal sphenoid sinus disease as above. Electronically Signed   By: Ozell Daring M.D.   On: 09/03/2024 16:04     TODAY-DAY OF DISCHARGE:  Subjective:   Corban Kistler today has no headache,no chest abdominal pain,no new weakness tingling or numbness, feels much better wants to go home today.   Objective:   Blood pressure (!) 132/91, pulse 60, temperature 97.9 F (36.6 C), temperature source Oral, resp. rate 16, height 5' 8 (1.727 m), weight 55.2 kg, SpO2 95%.  Intake/Output Summary (Last 24 hours) at 09/10/2024 0841 Last data filed at 09/10/2024 9187 Gross per 24 hour  Intake --  Output 1200 ml  Net -1200 ml   Filed Weights   09/03/24 1245  Weight: 55.2 kg    Exam: Awake Alert, Oriented *3, No new F.N deficits, Normal affect [place].AT,PERRAL Supple Neck,No JVD, No cervical lymphadenopathy appriciated.  Symmetrical Chest wall movement, Good air movement bilaterally, CTAB RRR,No Gallops,Rubs or new Murmurs, No Parasternal Heave +ve B.Sounds, Abd Soft, Non tender, No organomegaly appriciated, No rebound -guarding or rigidity. No Cyanosis, Clubbing or edema, No new Rash or bruise   PERTINENT RADIOLOGIC STUDIES: No results found.   PERTINENT LAB RESULTS: CBC: Recent Labs    09/08/24 0427  WBC 8.4  HGB 9.9*  HCT 34.3*  PLT 284   CMET CMP     Component Value Date/Time   NA 136 09/08/2024 0427   NA 139 07/17/2024 1517   K 3.7 09/08/2024 0427   CL 104 09/08/2024 0427   CO2 19 (L) 09/08/2024 0427   GLUCOSE 99 09/08/2024 0427   BUN 10 09/08/2024 0427   BUN 19 07/17/2024 1517   CREATININE 1.12 09/08/2024 0427   CREATININE 1.24 03/01/2017 1229   CALCIUM  8.9 09/08/2024 0427   PROT 7.0 09/04/2024 1444   PROT 7.5 07/17/2024 1517   ALBUMIN  3.4 (L) 09/04/2024  1444   ALBUMIN  4.2 07/17/2024 1517   AST 29 09/04/2024 1444   ALT 20 09/04/2024 1444   ALKPHOS 70 09/04/2024 1444   BILITOT 1.4 (H) 09/04/2024 1444   BILITOT 0.3 07/17/2024 1517   EGFR 49 (L) 07/17/2024 1517   GFRNONAA >60 09/08/2024 0427   GFRNONAA 62 03/01/2017 1229    GFR Estimated Creatinine Clearance: 48.6 mL/min (by C-G formula based on SCr of 1.12 mg/dL). No results for input(s): LIPASE, AMYLASE in the last 72 hours. No results for input(s): CKTOTAL, CKMB, CKMBINDEX, TROPONINI in the last 72 hours. Invalid input(s): POCBNP No results for input(s): DDIMER in the last 72 hours. No results  for input(s): HGBA1C in the last 72 hours. No results for input(s): CHOL, HDL, LDLCALC, TRIG, CHOLHDL, LDLDIRECT in the last 72 hours. No results for input(s): TSH, T4TOTAL, T3FREE, THYROIDAB in the last 72 hours.  Invalid input(s): FREET3 No results for input(s): VITAMINB12, FOLATE, FERRITIN, TIBC, IRON , RETICCTPCT in the last 72 hours. Coags: No results for input(s): INR in the last 72 hours.  Invalid input(s): PT Microbiology: No results found for this or any previous visit (from the past 240 hours).  FURTHER DISCHARGE INSTRUCTIONS:  Get Medicines reviewed and adjusted: Please take all your medications with you for your next visit with your Primary MD  Laboratory/radiological data: Please request your Primary MD to go over all hospital tests and procedure/radiological results at the follow up, please ask your Primary MD to get all Hospital records sent to his/her office.  In some cases, they will be blood work, cultures and biopsy results pending at the time of your discharge. Please request that your primary care M.D. goes through all the records of your hospital data and follows up on these results.  Also Note the following: If you experience worsening of your admission symptoms, develop shortness of breath, life threatening  emergency, suicidal or homicidal thoughts you must seek medical attention immediately by calling 911 or calling your MD immediately  if symptoms less severe.  You must read complete instructions/literature along with all the possible adverse reactions/side effects for all the Medicines you take and that have been prescribed to you. Take any new Medicines after you have completely understood and accpet all the possible adverse reactions/side effects.   Do not drive when taking Pain medications or sleeping medications (Benzodaizepines)  Do not take more than prescribed Pain, Sleep and Anxiety Medications. It is not advisable to combine anxiety,sleep and pain medications without talking with your primary care practitioner  Special Instructions: If you have smoked or chewed Tobacco  in the last 2 yrs please stop smoking, stop any regular Alcohol  and or any Recreational drug use.  Wear Seat belts while driving.  Please note: You were cared for by a hospitalist during your hospital stay. Once you are discharged, your primary care physician will handle any further medical issues. Please note that NO REFILLS for any discharge medications will be authorized once you are discharged, as it is imperative that you return to your primary care physician (or establish a relationship with a primary care physician if you do not have one) for your post hospital discharge needs so that they can reassess your need for medications and monitor your lab values.  Total Time spent coordinating discharge including counseling, education and face to face time equals greater than 30 minutes.  SignedBETHA Donalda Applebaum 09/10/2024 8:41 AM

## 2024-09-11 DIAGNOSIS — N1832 Chronic kidney disease, stage 3b: Secondary | ICD-10-CM | POA: Diagnosis not present

## 2024-09-11 DIAGNOSIS — M6281 Muscle weakness (generalized): Secondary | ICD-10-CM | POA: Diagnosis not present

## 2024-09-11 DIAGNOSIS — I5023 Acute on chronic systolic (congestive) heart failure: Secondary | ICD-10-CM | POA: Diagnosis not present

## 2024-09-11 DIAGNOSIS — I10 Essential (primary) hypertension: Secondary | ICD-10-CM | POA: Diagnosis not present

## 2024-09-11 DIAGNOSIS — E785 Hyperlipidemia, unspecified: Secondary | ICD-10-CM | POA: Diagnosis not present

## 2024-09-11 DIAGNOSIS — E876 Hypokalemia: Secondary | ICD-10-CM | POA: Diagnosis not present

## 2024-09-11 DIAGNOSIS — K219 Gastro-esophageal reflux disease without esophagitis: Secondary | ICD-10-CM | POA: Diagnosis not present

## 2024-09-11 DIAGNOSIS — D649 Anemia, unspecified: Secondary | ICD-10-CM | POA: Diagnosis not present

## 2024-09-11 DIAGNOSIS — Z9181 History of falling: Secondary | ICD-10-CM | POA: Diagnosis not present

## 2024-09-16 DIAGNOSIS — I10 Essential (primary) hypertension: Secondary | ICD-10-CM | POA: Diagnosis not present

## 2024-09-16 DIAGNOSIS — E785 Hyperlipidemia, unspecified: Secondary | ICD-10-CM | POA: Diagnosis not present

## 2024-09-16 DIAGNOSIS — K219 Gastro-esophageal reflux disease without esophagitis: Secondary | ICD-10-CM | POA: Diagnosis not present

## 2024-09-16 DIAGNOSIS — I70219 Atherosclerosis of native arteries of extremities with intermittent claudication, unspecified extremity: Secondary | ICD-10-CM | POA: Diagnosis not present

## 2024-09-17 ENCOUNTER — Telehealth: Payer: Self-pay

## 2024-09-17 NOTE — Telephone Encounter (Signed)
 Copied from CRM #[id number]. Topic: General - Other >> Sep 17, 2024 10:45 AM Dedra B wrote: Reason for CRM: Almira from [place] called to let Dr. Delbert know that pt declined home health physical therapy orders. He was just discharged from [facility] and said he's doing okay and doesn't need any additional therapy services at home. If any questions, call Almira at [phone].

## 2024-09-17 NOTE — Telephone Encounter (Signed)
 FYI

## 2024-09-19 ENCOUNTER — Other Ambulatory Visit: Payer: Self-pay | Admitting: Family Medicine

## 2024-09-19 MED ORDER — PANTOPRAZOLE SODIUM 40 MG PO TBEC: 40.0000 mg | DELAYED_RELEASE_TABLET | Freq: Every day | ORAL | 0 refills | Status: DC

## 2024-09-19 NOTE — Telephone Encounter (Signed)
 Copied from CRM [id number]. Topic: Clinical - Medication Refill >> Sep 19, 2024 11:13 AM Rosaria E wrote: Medication: pantoprazole  (PROTONIX ) 40 MG tablet Furosemide  20 MG tablet   Has the patient contacted their pharmacy? Yes (Agent: If no, request that the patient contact the pharmacy for the refill. If patient does not wish to contact the pharmacy document the reason why and proceed with request.) (Agent: If yes, when and what did the pharmacy advise?)  This is the patient's preferred pharmacy:  [id number] - [place] - [address] [place] ([place]) [place] [address] Phone: [phone] Fax: [phone]  Is this the correct pharmacy for this prescription? Yes If no, delete pharmacy and type the correct one.   Has the prescription been filled recently? Yes  Is the patient out of the medication? Yes  Has the patient been seen for an appointment in the last year OR does the patient have an upcoming appointment? Yes  Can we respond through MyChart? Yes  Agent: Please be advised that Rx refills may take up to 3 business days. We ask that you follow-up with your pharmacy.

## 2024-09-19 NOTE — Telephone Encounter (Signed)
 Med refill

## 2024-09-20 ENCOUNTER — Other Ambulatory Visit: Payer: Self-pay | Admitting: *Deleted

## 2024-09-20 NOTE — Patient Outreach (Signed)
 EMMI notification received.  Mr. Cindric recently discharged from [facility]. Emmi notification indicates dc instructions were not given.  Telephone call made to Mr. Hartig to discuss further. No answer. HIPAA compliant voicemail left requesting return call.   Pablo Hurst, MSN, RN, BSN [place]  [facility] RN Post- Acute Care Manager Direct Dial: [phone]

## 2024-10-01 ENCOUNTER — Telehealth: Payer: Self-pay | Admitting: Family Medicine

## 2024-10-01 NOTE — Telephone Encounter (Signed)
 pt cancel appt he will come to 10/27th appt..I tried to give him tranportation pt declined

## 2024-10-02 ENCOUNTER — Inpatient Hospital Stay: Payer: Medicare (Managed Care) | Admitting: Nurse Practitioner

## 2024-10-09 ENCOUNTER — Other Ambulatory Visit: Payer: Self-pay | Admitting: Pharmacist

## 2024-10-09 DIAGNOSIS — I739 Peripheral vascular disease, unspecified: Secondary | ICD-10-CM

## 2024-10-09 MED ORDER — ATORVASTATIN CALCIUM 20 MG PO TABS: 20.0000 mg | ORAL_TABLET | Freq: Every day | ORAL | 1 refills | Status: AC

## 2024-10-09 NOTE — Progress Notes (Signed)
 Pharmacy Quality Measure Review  This patient is appearing on a report for the adherence measure for cholesterol (statin) medications this calendar year.   Medication: atorvastatin  Last fill date: 08/04/2024 for 90 day supply  Insurance report was not up to date. No action needed at this time.  Reminder set for next refill. Of note, current rxn was out of refills. New rxn sent for next fill in November.   Herlene Fleeta Morris, PharmD, JAQUELINE, CPP Clinical Pharmacist [facility] [phone]

## 2024-10-13 ENCOUNTER — Other Ambulatory Visit: Payer: Self-pay | Admitting: Family Medicine

## 2024-10-13 DIAGNOSIS — I10 Essential (primary) hypertension: Secondary | ICD-10-CM

## 2024-10-19 ENCOUNTER — Other Ambulatory Visit: Payer: Self-pay | Admitting: Family Medicine

## 2024-10-19 DIAGNOSIS — R430 Anosmia: Secondary | ICD-10-CM

## 2024-10-21 ENCOUNTER — Ambulatory Visit: Payer: Medicare (Managed Care) | Admitting: Family Medicine

## 2024-10-24 ENCOUNTER — Ambulatory Visit: Payer: Medicare (Managed Care) | Attending: Family Medicine | Admitting: Family Medicine

## 2024-10-24 ENCOUNTER — Encounter: Payer: Self-pay | Admitting: Family Medicine

## 2024-10-24 VITALS — BP 127/62 | HR 70 | Temp 97.8°F | Ht 68.0 in | Wt 123.2 lb

## 2024-10-24 DIAGNOSIS — I10 Essential (primary) hypertension: Secondary | ICD-10-CM

## 2024-10-24 DIAGNOSIS — I119 Hypertensive heart disease without heart failure: Secondary | ICD-10-CM

## 2024-10-24 DIAGNOSIS — I5042 Chronic combined systolic (congestive) and diastolic (congestive) heart failure: Secondary | ICD-10-CM

## 2024-10-24 DIAGNOSIS — I110 Hypertensive heart disease with heart failure: Secondary | ICD-10-CM | POA: Diagnosis not present

## 2024-10-24 DIAGNOSIS — Z720 Tobacco use: Secondary | ICD-10-CM | POA: Diagnosis not present

## 2024-10-24 DIAGNOSIS — I739 Peripheral vascular disease, unspecified: Secondary | ICD-10-CM

## 2024-10-24 DIAGNOSIS — Z23 Encounter for immunization: Secondary | ICD-10-CM | POA: Diagnosis not present

## 2024-10-24 MED ORDER — AMLODIPINE BESYLATE 10 MG PO TABS: 10.0000 mg | ORAL_TABLET | Freq: Every day | ORAL | 1 refills | Status: AC

## 2024-10-24 MED ORDER — LISINOPRIL-HYDROCHLOROTHIAZIDE 20-25 MG PO TABS: 1.0000 | ORAL_TABLET | Freq: Every day | ORAL | 1 refills | Status: AC

## 2024-10-24 MED ORDER — DAPAGLIFLOZIN PROPANEDIOL 10 MG PO TABS: 10.0000 mg | ORAL_TABLET | Freq: Every day | ORAL | 1 refills | Status: AC

## 2024-10-24 MED ORDER — PANTOPRAZOLE SODIUM 40 MG PO TBEC: 40.0000 mg | DELAYED_RELEASE_TABLET | Freq: Every day | ORAL | 1 refills | Status: AC

## 2024-10-24 NOTE — Patient Instructions (Signed)
 VISIT SUMMARY:  During your visit, we discussed your ongoing issues with leg swelling, medication management, and overall health. We reviewed your current medications and made some adjustments to better manage your conditions. We also talked about the importance of smoking cessation and keeping up with vaccinations.  YOUR PLAN:  -CONGESTIVE HEART FAILURE: Congestive heart failure is a condition where the heart doesn't pump blood as well as it should, leading to symptoms like shortness of breath and swelling. We are starting you on a new medication called Farxiga to help manage this condition. We will repeat blood tests in two weeks to monitor your kidney function.  -PERIPHERAL VASCULAR DISEASE OF LOWER EXTREMITIES: Peripheral vascular disease is a circulation disorder that affects blood vessels outside of your heart and brain, often leading to leg swelling. Your leg swelling has improved but is still present. Continuing to manage your medications and considering lifestyle changes like quitting smoking can help.  -TOBACCO USE DISORDER: Tobacco use disorder is a condition where you are dependent on tobacco, which can worsen your vascular disease and heart failure. We strongly encourage you to quit smoking to improve your overall health.  -HYPERTENSION: Hypertension, or high blood pressure, is being managed with your current medications, amlodipine  and lisinopril -hydrochlorothiazide . Continue taking these medications as prescribed.  -HYPERLIPIDEMIA: Hyperlipidemia is a condition where you have high levels of fats in your blood, which is being managed with atorvastatin . Continue taking this medication as prescribed.  -GENERAL HEALTH MAINTENANCE: You are due for your flu vaccination, which is important for preventing the flu. We will administer the flu shot today.  INSTRUCTIONS:  Please return in two weeks for repeat blood tests to monitor your kidney function. Continue taking all your medications as  prescribed and consider quitting smoking to improve your health. If you have any new symptoms or concerns, please contact our office.

## 2024-10-24 NOTE — Progress Notes (Signed)
 Subjective:  Patient ID: Martin Mason, male    DOB: [birth year]  Age: 68 y.o. MRN: [id number]  CC: Medical Management of Chronic Issues     Discussed the use of AI scribe software for clinical note transcription with the patient, who gave verbal consent to proceed.  History of Present Illness Martin Mason is a 68 year old male with peripheral vascular disease and congestive heart failure who presents with leg swelling and medication management.  He has experienced improvement in leg swelling, which was previously more significant. He continues to smoke, which affects his peripheral vascular disease. He is compliant with furosemide  and potassium for fluid retention, with recent tests showing normal potassium levels. He takes lisinopril  and amlodipine  for blood pressure, one pill per day. No current shortness of breath is noted, despite his diagnosis of congestive heart failure.    Past Medical History:  Diagnosis Date   Asthma    childhood- young child to age 66   Hypertension    Peripheral vascular disease     Past Surgical History:  Procedure Laterality Date   COLONOSCOPY     COLONOSCOPY N/A 06/29/2024   Procedure: COLONOSCOPY;  Surgeon: Federico Rosario BROCKS, MD;  Location: [facility] ENDOSCOPY;  Service: Gastroenterology;  Laterality: N/A;   CYST EXCISION     throat   ESOPHAGOGASTRODUODENOSCOPY N/A 06/29/2024   Procedure: EGD (ESOPHAGOGASTRODUODENOSCOPY);  Surgeon: Federico Rosario BROCKS, MD;  Location: [facility] ENDOSCOPY;  Service: Gastroenterology;  Laterality: N/A;   TOOTH EXTRACTION N/A 11/06/2020   Procedure: DENTAL RESTORATION/EXTRACTIONS;  Surgeon: Sheryle Hamilton, DDS;  Location: [facility] OR;  Service: Oral Surgery;  Laterality: N/A;    Family History  Problem Relation Age of Onset   Hypertension Mother    Cancer Mother    Hypertension Father    Diabetes Sister     Social History   Socioeconomic History   Marital status: Divorced    Spouse name: Not on file   Number of children: Not on file    Years of education: Not on file   Highest education level: Not on file  Occupational History   Not on file  Tobacco Use   Smoking status: Every Day    Current packs/day: 0.12    Types: Cigarettes   Smokeless tobacco: Never   Tobacco comments:    3 daily  Vaping Use   Vaping status: Never Used  Substance and Sexual Activity   Alcohol use: No    Comment: quit drinking 7 years ago after 30 years of drinking   Drug use: Yes    Types: Marijuana    Comment: last time early November 2021   Sexual activity: Not on file  Other Topics Concern   Not on file  Social History Narrative   Not on file   Social Drivers of Health   Financial Resource Strain: Low Risk  (02/06/2023)   Overall Financial Resource Strain (CARDIA)    Difficulty of Paying Living Expenses: Not hard at all  Food Insecurity: No Food Insecurity (09/05/2024)   Hunger Vital Sign    Worried About Running Out of Food in the Last Year: Never true    Ran Out of Food in the Last Year: Never true  Transportation Needs: No Transportation Needs (09/05/2024)   PRAPARE - Administrator, Civil Service (Medical): No    Lack of Transportation (Non-Medical): No  Physical Activity: Sufficiently Active (02/06/2023)   Exercise Vital Sign    Days of Exercise per Week: 7 days  Minutes of Exercise per Session: 30 min  Stress: No Stress Concern Present (02/06/2023)   [employer] of Occupational Health - Occupational Stress Questionnaire    Feeling of Stress : Not at all  Social Connections: Patient Declined (09/05/2024)   Social Connection and Isolation Panel    Frequency of Communication with Friends and Family: Patient declined    Frequency of Social Gatherings with Friends and Family: Patient declined    Attends Religious Services: Patient declined    Database Administrator or Organizations: Patient declined    Attends Banker Meetings: Patient declined    Marital Status: Patient declined    No Known  Allergies  Outpatient Medications Prior to Visit  Medication Sig Dispense Refill   albuterol  (PROVENTIL ) (2.5 MG/3ML) 0.083% nebulizer solution Take 3 mLs (2.5 mg total) by nebulization every 2 (two) hours as needed for wheezing.     amLODipine  (NORVASC ) 5 MG tablet Take 1 tablet (5 mg total) by mouth daily.     atorvastatin  (LIPITOR) 20 MG tablet Take 1 tablet (20 mg total) by mouth daily. 90 tablet 1   CALCIUM  PO Take 1 tablet by mouth daily. Unknown formulation/strength     cetirizine  (ZYRTEC ) 10 MG tablet Take 1 tablet (10 mg total) by mouth daily. 90 tablet 1   cholecalciferol  (VITAMIN D3) 10 MCG (400 UNIT) TABS tablet Take 1 tablet (400 Units total) by mouth daily. 30 tablet 0   ferrous sulfate  325 (65 FE) MG tablet Take 1 tablet (325 mg total) by mouth 2 (two) times daily with a meal. 60 tablet 1   fluticasone  (FLONASE ) 50 MCG/ACT nasal spray Use 2 spray(s) in each nostril once daily 48 g 0   furosemide  (LASIX ) 20 MG tablet Take 20 mg by mouth daily.     lisinopril  (ZESTRIL ) 20 MG tablet Take 1 tablet (20 mg total) by mouth daily.     methocarbamol  (ROBAXIN ) 500 MG tablet Take 1 tablet (500 mg total) by mouth every 8 (eight) hours as needed for muscle spasms.     nicotine  (NICODERM CQ  - DOSED IN MG/24 HOURS) 21 mg/24hr patch Place 1 patch (21 mg total) onto the skin daily.     pantoprazole  (PROTONIX ) 40 MG tablet Take 1 tablet by mouth once daily 30 tablet 0   sildenafil  (VIAGRA ) 50 MG tablet TAKE 1 TABLET BY MOUTH ONCE DAILY AS NEEDED FOR ERECTILE DYSFUNCTION AT  LEAST  24  HOURS  BETWEEN  DOSES 10 tablet 0   No facility-administered medications prior to visit.     ROS Review of Systems  Constitutional:  Negative for activity change and appetite change.  HENT:  Negative for sinus pressure and sore throat.   Respiratory:  Negative for chest tightness, shortness of breath and wheezing.   Cardiovascular:  Positive for leg swelling. Negative for chest pain and palpitations.   Gastrointestinal:  Negative for abdominal distention, abdominal pain and constipation.  Genitourinary: Negative.   Musculoskeletal: Negative.   Psychiatric/Behavioral:  Negative for behavioral problems and dysphoric mood.     Objective:  BP 127/62   Pulse 70   Temp 97.8 F (36.6 C) (Oral)   Ht 5' 8 (1.727 m)   Wt 123 lb 3.2 oz (55.9 kg)   SpO2 99%   BMI 18.73 kg/m      10/24/2024    2:01 PM 09/10/2024   11:00 AM 09/10/2024    8:00 AM  BP/Weight  Systolic BP 127 128 132  Diastolic BP 62 62  91  Wt. (Lbs) 123.2    BMI 18.73 kg/m2        Physical Exam Constitutional:      Appearance: He is well-developed.  Neck:     Comments: No JVD Cardiovascular:     Rate and Rhythm: Normal rate.     Heart sounds: Normal heart sounds. No murmur heard. Pulmonary:     Effort: Pulmonary effort is normal.     Breath sounds: Normal breath sounds. No wheezing or rales.  Chest:     Chest wall: No tenderness.  Abdominal:     General: Bowel sounds are normal. There is no distension.     Palpations: Abdomen is soft. There is no mass.     Tenderness: There is no abdominal tenderness.  Musculoskeletal:        General: Normal range of motion.     Right lower leg: Edema present.     Left lower leg: Edema present.  Neurological:     Mental Status: He is alert and oriented to person, place, and time.  Psychiatric:        Mood and Affect: Mood normal.    ***    Latest Ref Rng & Units 09/08/2024    4:27 AM 09/04/2024    2:44 PM 09/03/2024    2:16 PM  CMP  Glucose 70 - 99 mg/dL 99  99  84   BUN 8 - 23 mg/dL 10  14  22    Creatinine 0.61 - 1.24 mg/dL 8.87  8.66  8.19   Sodium 135 - 145 mmol/L 136  131  136   Potassium 3.5 - 5.1 mmol/L 3.7  3.0  3.5   Chloride 98 - 111 mmol/L 104  98  102   CO2 22 - 32 mmol/L 19  21    Calcium  8.9 - 10.3 mg/dL 8.9  8.9    Total Protein 6.5 - 8.1 g/dL  7.0    Total Bilirubin 0.0 - 1.2 mg/dL  1.4    Alkaline Phos 38 - 126 U/L  70    AST 15 - 41 U/L  29     ALT 0 - 44 U/L  20      Lipid Panel     Component Value Date/Time   CHOL 85 02/03/2024 1630   CHOL 156 07/03/2019 1105   TRIG 50 02/03/2024 1630   HDL 43 02/03/2024 1630   HDL 54 07/03/2019 1105   CHOLHDL 2.0 02/03/2024 1630   VLDL 10 02/03/2024 1630   LDLCALC 32 02/03/2024 1630   LDLCALC 89 07/03/2019 1105    CBC    Component Value Date/Time   WBC 8.4 09/08/2024 0427   RBC 4.70 09/08/2024 0427   HGB 9.9 (L) 09/08/2024 0427   HGB 7.3 (L) 07/17/2024 1517   HCT 34.3 (L) 09/08/2024 0427   HCT 25.3 (L) 07/17/2024 1517   PLT 284 09/08/2024 0427   PLT 402 07/17/2024 1517   MCV 73.0 (L) 09/08/2024 0427   MCV 71 (L) 07/17/2024 1517   MCH 21.1 (L) 09/08/2024 0427   MCHC 28.9 (L) 09/08/2024 0427   RDW 29.4 (H) 09/08/2024 0427   RDW 22.4 (H) 07/17/2024 1517   LYMPHSABS 1.4 07/17/2024 1517   MONOABS 0.6 02/03/2024 1147   EOSABS 0.2 07/17/2024 1517   BASOSABS 0.1 07/17/2024 1517    Lab Results  Component Value Date   HGBA1C 5.5 12/16/2014      There are no diagnoses linked to this encounter.   Healthcare maintenance ***  No orders of the defined types were placed in this encounter.   Follow-up: No follow-ups on file.       Corrina Sabin, MD, FAAFP. [facility] [place], [place] [phone]   10/24/2024, 2:11 PM

## 2024-10-25 ENCOUNTER — Encounter: Payer: Self-pay | Admitting: Family Medicine

## 2024-10-30 ENCOUNTER — Other Ambulatory Visit: Payer: Self-pay | Admitting: Pharmacist

## 2024-10-30 NOTE — Progress Notes (Signed)
 Pharmacy Quality Measure Review  This patient is appearing on a report for the adherence measure for cholesterol (statin) medications this calendar year.   Medication: atorvastatin  Last fill date: 10/28/2024 for 90 day supply, however, has not been dispensed yet. Will set reminder for next week to follow-up.   Martin Mason, PharmD, JAQUELINE, CPP Clinical Pharmacist [facility] [phone]

## 2024-11-06 ENCOUNTER — Other Ambulatory Visit: Payer: Self-pay | Admitting: Pharmacist

## 2024-11-06 NOTE — Progress Notes (Signed)
 Pharmacy Quality Measure Review  This patient is appearing on a report for the adherence measure for cholesterol (statin) medications this calendar year.   Medication: atorvastatin  Last fill date: 10/28/2024 for 90 day supply, however, has not been dispensed yet. I contacted the patient today and he will pick-up tomorrow (11/07/24).  Herlene Fleeta Morris, PharmD, JAQUELINE, CPP Clinical Pharmacist [facility] [phone]

## 2024-11-07 ENCOUNTER — Ambulatory Visit: Payer: Medicare (Managed Care) | Attending: Family Medicine

## 2024-11-07 DIAGNOSIS — I110 Hypertensive heart disease with heart failure: Secondary | ICD-10-CM

## 2024-11-07 DIAGNOSIS — D509 Iron deficiency anemia, unspecified: Secondary | ICD-10-CM | POA: Diagnosis not present

## 2024-11-07 DIAGNOSIS — I5042 Chronic combined systolic (congestive) and diastolic (congestive) heart failure: Secondary | ICD-10-CM | POA: Diagnosis not present

## 2024-11-08 ENCOUNTER — Other Ambulatory Visit: Payer: Self-pay | Admitting: Pharmacist

## 2024-11-08 LAB — CMP14+EGFR
ALT: 13 IU/L (ref 0–44)
Albumin: 4.2 g/dL (ref 3.9–4.9)
Alkaline Phosphatase: 90 IU/L (ref 47–123)
BUN/Creatinine Ratio: 12 (ref 10–24)
BUN: 16 mg/dL (ref 8–27)
Bilirubin Total: 0.4 mg/dL (ref 0.0–1.2)
CO2: 17 mmol/L — ABNORMAL LOW (ref 20–29)
Calcium: 9.6 mg/dL (ref 8.6–10.2)
Chloride: 104 mmol/L (ref 96–106)
Creatinine, Ser: 1.32 mg/dL — ABNORMAL HIGH (ref 0.76–1.27)
Globulin, Total: 3.5 g/dL (ref 1.5–4.5)
Glucose: 87 mg/dL (ref 70–99)
Sodium: 140 mmol/L (ref 134–144)
Total Protein: 7.7 g/dL (ref 6.0–8.5)
eGFR: 58 mL/min/1.73 — ABNORMAL LOW (ref >59)

## 2024-11-08 LAB — CBC WITH DIFFERENTIAL/PLATELET
Basophils Absolute: 0 x10E3/uL (ref 0.0–0.2)
Basos: 1 %
EOS (ABSOLUTE): 0.3 x10E3/uL (ref 0.0–0.4)
Eos: 4 %
Hematocrit: 36.2 % — ABNORMAL LOW (ref 37.5–51.0)
Hemoglobin: 11 g/dL — ABNORMAL LOW (ref 13.0–17.7)
Immature Grans (Abs): 0 x10E3/uL (ref 0.0–0.1)
Immature Granulocytes: 0 %
Lymphocytes Absolute: 1.2 x10E3/uL (ref 0.7–3.1)
Lymphs: 19 %
MCH: 25.1 pg — ABNORMAL LOW (ref 26.6–33.0)
MCHC: 30.4 g/dL — ABNORMAL LOW (ref 31.5–35.7)
MCV: 83 fL (ref 79–97)
Monocytes Absolute: 0.5 x10E3/uL (ref 0.1–0.9)
Monocytes: 8 %
Neutrophils Absolute: 4.5 x10E3/uL (ref 1.4–7.0)
Neutrophils: 68 %
Platelets: 283 x10E3/uL (ref 150–450)
RBC: 4.38 x10E6/uL (ref 4.14–5.80)
RDW: 23.1 % — ABNORMAL HIGH (ref 11.6–15.4)
WBC: 6.5 x10E3/uL (ref 3.4–10.8)

## 2024-11-08 NOTE — Progress Notes (Signed)
 Pharmacy Quality Measure Review  This patient is appearing on a report for the adherence measure for cholesterol (statin) medications this calendar year.   Medication: atorvastatin  Last fill date: 10/28/2024 for 90 day supply, however, has not been dispensed yet. I contacted the patient 11/5 and 11/06/24. He told me he would pick-up 11/07/24 but he has not.   Herlene Fleeta Morris, PharmD, JAQUELINE, CPP Clinical Pharmacist [facility] [phone]

## 2024-12-21 ENCOUNTER — Other Ambulatory Visit: Payer: Self-pay | Admitting: Family Medicine

## 2025-01-23 ENCOUNTER — Other Ambulatory Visit: Payer: Self-pay | Admitting: Family Medicine

## 2025-01-23 DIAGNOSIS — R430 Anosmia: Secondary | ICD-10-CM

## 2025-01-27 ENCOUNTER — Telehealth: Payer: Self-pay | Admitting: Family Medicine

## 2025-01-27 ENCOUNTER — Ambulatory Visit: Payer: Medicare (Managed Care) | Admitting: Family Medicine

## 2025-01-27 NOTE — Telephone Encounter (Signed)
 Contacted pt to resch appt.Office closed pt decided to cancel appt instead and will reach back out to resch his appt

## 2025-03-18 ENCOUNTER — Ambulatory Visit: Payer: Self-pay | Admitting: Family Medicine
